# Patient Record
Sex: Male | Born: 1945
Health system: Southern US, Community
[De-identification: ages and names within clinical notes are randomized; demographics above are authoritative.]

## PROBLEM LIST (undated history)

## (undated) DIAGNOSIS — G4733 Obstructive sleep apnea (adult) (pediatric): Secondary | ICD-10-CM

## (undated) DIAGNOSIS — T7840XA Allergy, unspecified, initial encounter: Secondary | ICD-10-CM

## (undated) DIAGNOSIS — M549 Dorsalgia, unspecified: Secondary | ICD-10-CM

## (undated) DIAGNOSIS — Z9989 Dependence on other enabling machines and devices: Secondary | ICD-10-CM

## (undated) DIAGNOSIS — N401 Enlarged prostate with lower urinary tract symptoms: Secondary | ICD-10-CM

## (undated) DIAGNOSIS — G20A1 Parkinson's disease without dyskinesia, without mention of fluctuations: Secondary | ICD-10-CM

## (undated) DIAGNOSIS — K219 Gastro-esophageal reflux disease without esophagitis: Secondary | ICD-10-CM

## (undated) DIAGNOSIS — N138 Other obstructive and reflux uropathy: Secondary | ICD-10-CM

## (undated) DIAGNOSIS — C44301 Unspecified malignant neoplasm of skin of nose: Secondary | ICD-10-CM

## (undated) DIAGNOSIS — I4891 Unspecified atrial fibrillation: Secondary | ICD-10-CM

## (undated) DIAGNOSIS — G20 Parkinson's disease: Secondary | ICD-10-CM

## (undated) DIAGNOSIS — C679 Malignant neoplasm of bladder, unspecified: Secondary | ICD-10-CM

## (undated) HISTORY — DX: Obstructive sleep apnea (adult) (pediatric): G47.33

## (undated) HISTORY — DX: Unspecified malignant neoplasm of skin of nose: C44.301

## (undated) HISTORY — PX: OTHER SURGICAL HISTORY: SHX169

## (undated) HISTORY — PX: COLONOSCOPY: SHX174

## (undated) HISTORY — DX: Parkinson's disease: G20

## (undated) HISTORY — DX: Parkinson's disease without dyskinesia, without mention of fluctuations: G20.A1

## (undated) HISTORY — DX: Allergy, unspecified, initial encounter: T78.40XA

## (undated) HISTORY — DX: Dependence on other enabling machines and devices: Z99.89

## (undated) HISTORY — DX: Unspecified atrial fibrillation: I48.91

## (undated) HISTORY — PX: CYSTOSCOPY WITH URETHRAL DILATATION: SHX5125

## (undated) HISTORY — DX: Dorsalgia, unspecified: M54.9

---

## 1998-06-23 ENCOUNTER — Ambulatory Visit (HOSPITAL_COMMUNITY): Admission: RE | Admit: 1998-06-23 | Discharge: 1998-06-23 | Payer: Self-pay | Admitting: *Deleted

## 1998-07-14 ENCOUNTER — Other Ambulatory Visit: Admission: RE | Admit: 1998-07-14 | Discharge: 1998-07-14 | Payer: Self-pay | Admitting: *Deleted

## 1998-08-02 HISTORY — PX: CERVICAL FUSION: SHX112

## 1998-08-28 ENCOUNTER — Ambulatory Visit (HOSPITAL_COMMUNITY): Admission: RE | Admit: 1998-08-28 | Discharge: 1998-08-28 | Payer: Self-pay | Admitting: Sports Medicine

## 1998-08-28 ENCOUNTER — Encounter: Payer: Self-pay | Admitting: Sports Medicine

## 1998-09-11 ENCOUNTER — Encounter: Payer: Self-pay | Admitting: Sports Medicine

## 1998-09-11 ENCOUNTER — Ambulatory Visit (HOSPITAL_COMMUNITY): Admission: RE | Admit: 1998-09-11 | Discharge: 1998-09-11 | Payer: Self-pay | Admitting: Sports Medicine

## 1998-09-25 ENCOUNTER — Encounter: Payer: Self-pay | Admitting: Sports Medicine

## 1998-09-25 ENCOUNTER — Ambulatory Visit (HOSPITAL_COMMUNITY): Admission: RE | Admit: 1998-09-25 | Discharge: 1998-09-25 | Payer: Self-pay | Admitting: Sports Medicine

## 1999-01-05 ENCOUNTER — Ambulatory Visit (HOSPITAL_COMMUNITY): Admission: RE | Admit: 1999-01-05 | Discharge: 1999-01-05 | Payer: Self-pay | Admitting: *Deleted

## 1999-01-07 ENCOUNTER — Encounter: Payer: Self-pay | Admitting: Neurosurgery

## 1999-01-09 ENCOUNTER — Encounter: Payer: Self-pay | Admitting: Neurosurgery

## 1999-01-09 ENCOUNTER — Inpatient Hospital Stay (HOSPITAL_COMMUNITY): Admission: RE | Admit: 1999-01-09 | Discharge: 1999-01-10 | Payer: Self-pay | Admitting: Neurosurgery

## 2003-08-29 ENCOUNTER — Ambulatory Visit (HOSPITAL_COMMUNITY): Admission: RE | Admit: 2003-08-29 | Discharge: 2003-08-29 | Payer: Self-pay | Admitting: Gastroenterology

## 2010-12-30 ENCOUNTER — Other Ambulatory Visit: Payer: Self-pay | Admitting: Gastroenterology

## 2011-03-31 ENCOUNTER — Other Ambulatory Visit: Payer: Self-pay | Admitting: Urology

## 2011-03-31 ENCOUNTER — Ambulatory Visit (HOSPITAL_BASED_OUTPATIENT_CLINIC_OR_DEPARTMENT_OTHER)
Admission: RE | Admit: 2011-03-31 | Discharge: 2011-03-31 | Disposition: A | Discharge status: Home / Self Care | Payer: BC Managed Care – PPO | Source: Ambulatory Visit | Attending: Urology | Admitting: Urology

## 2011-03-31 DIAGNOSIS — Z01812 Encounter for preprocedural laboratory examination: Secondary | ICD-10-CM | POA: Insufficient documentation

## 2011-03-31 DIAGNOSIS — Z79899 Other long term (current) drug therapy: Secondary | ICD-10-CM | POA: Insufficient documentation

## 2011-03-31 DIAGNOSIS — C679 Malignant neoplasm of bladder, unspecified: Secondary | ICD-10-CM | POA: Insufficient documentation

## 2011-04-06 NOTE — Op Note (Signed)
NAMEMarland Kitchen  JERVON, REAM NO.:  [id number]  MEDICAL RECORD NO.:  [id number]  LOCATION:                                 FACILITY:  PHYSICIAN:  Natalia Leatherwood, MD         DATE OF BIRTH:  DATE OF PROCEDURE:  03/31/2011 DATE OF DISCHARGE:                              OPERATIVE REPORT   SURGEON:  Natalia Leatherwood, MD  ASSISTANT:  Martina Sinner, MD  PREOPERATIVE DIAGNOSIS:  Bladder tumor.  POSTOPERATIVE DIAGNOSIS:  Bladder tumor.  PROCEDURE PERFORMED:  Cystoscopy and transurethral resection of bladder tumor, greater than 2 cm smaller than 5 cm in size.  ESTIMATED BLOOD LOSS:  Minimal.  SPECIMEN:  Bladder tumor sent for permanent pathology.  COMPLICATIONS:  None.  HISTORY OF PRESENT ILLNESS:  This is a 65 year old gentleman, who is a patient of Dr. Sherron Monday, who was found to have a bladder tumor.  This tumor was seen on office cystoscopy and Dr. Sherron Monday scheduled the patient for a transurethral resection of the bladder.  He discussed the risks and benefits of the procedure with him.  Upon evaluation of the tumor while in the operating room, Dr. Sherron Monday did call me in and asked me to help with the resection of the bladder tumor due to its location near the right ureteral orifice and also overlying the right lateral wall of the bladder concerning for obturator reflex.  PROCEDURE:  After informed consent had been obtained, the patient was taken to the operating room where he was placed in the supine position. IV antibiotics were infused and general anesthesia was induced.  He was placed in a dorsal lithotomy position to make sure to pad all pertinent neurovascular pressure points.  His genitals were then prepped and draped in usual sterile fashion and then a 21-French cystoscope was advanced through the urethra and into the bladder.  There was noted that he had a very large obstructing prostate.  The bladder tumor was very extensive ranging from  very close to the right ureteral orifice over the lateral bladder wall on the right side.  There were no other tumors elsewhere in the bladder.  As mentioned at this point, Dr. Sherron Monday did give me a call as he felt this tumor was more extensive than he felt comfortable with resecting.  I did come in and we placed a blunt obturator for the Gyrus resectoscope through the urethra and the bladder.  Next, using continuous flow and resecting in normal saline, we performed a transurethral resection of the bladder tumor.  We were constantly aware of the location of the right ureteral orifice.  There was a small obturator reflex, but there was no injury while this occurred.  The tumor was resected in its entirety, making sure to dissect down into muscular tissue as well.  All of the bladder chips were removed from the bladder and sent for permanent pathology. Hemostasis was maintained by coagulating the edges of the tumor, also coagulating very close to the right ureteral orifice, but not circumferentially as there was concerning tissue close to this area. After this was done, a 20-French Coude catheter was placed as the patient had  a large obstructing prostate.  10 cc of sterile water replaced into this.  Patient was placed back in supine position and anesthesia was reversed and he was taken to the PACU in a stable condition.  Patient will return to my clinic on Tuesday to have the catheter removed.          ______________________________ Natalia Leatherwood, MD     DW/MEDQ  D:  03/31/2011  T:  04/01/2011  Job:  [id number]  Electronically Signed by Natalia Leatherwood MD on 04/06/2011 07:30:25 PM

## 2011-05-04 ENCOUNTER — Other Ambulatory Visit: Payer: Self-pay | Admitting: Urology

## 2011-05-04 ENCOUNTER — Ambulatory Visit (HOSPITAL_BASED_OUTPATIENT_CLINIC_OR_DEPARTMENT_OTHER)
Admission: RE | Admit: 2011-05-04 | Discharge: 2011-05-04 | Disposition: A | Discharge status: Home / Self Care | Payer: BC Managed Care – PPO | Source: Ambulatory Visit | Attending: Urology | Admitting: Urology

## 2011-05-04 DIAGNOSIS — N35919 Unspecified urethral stricture, male, unspecified site: Secondary | ICD-10-CM | POA: Insufficient documentation

## 2011-05-04 DIAGNOSIS — C679 Malignant neoplasm of bladder, unspecified: Secondary | ICD-10-CM | POA: Insufficient documentation

## 2011-05-04 DIAGNOSIS — Z01812 Encounter for preprocedural laboratory examination: Secondary | ICD-10-CM | POA: Insufficient documentation

## 2011-05-04 LAB — POCT HEMOGLOBIN-HEMACUE: Hemoglobin: 14.2 g/dL (ref 13.0–17.0)

## 2011-05-07 NOTE — Op Note (Signed)
Ryan Woodward, Ryan Woodward NO.:  [id number]  MEDICAL RECORD NO.:  [id number]  LOCATION:                               FACILITY:  [facility]  PHYSICIAN:  Natalia Leatherwood, MD    DATE OF BIRTH:  February 12, 1946  DATE OF PROCEDURE:  05/04/2011 DATE OF DISCHARGE:                              OPERATIVE REPORT   PREOPERATIVE DIAGNOSIS:  Bladder cancer.  POSTOPERATIVE DIAGNOSES:  Bladder cancer and meatal stenosis.  PROCEDURE:  Transurethral resection of the bladder tumor base, urethral dilation, bladder biopsy, cystourethroscopy, catheter placement.  ASSISTANTS:  None.  COMPLICATIONS:  None.  ANESTHESIA:  General.  SPECIMENS: 1. Chips from bladder tumor base were sent as one specimen. 2. Right lateral bladder wall biopsy. 3. Left lateral bladder wall biopsy.  ESTIMATED BLOOD LOSS:  Minimal.  COMPLICATIONS:  None.  FINDINGS:  Previously resected bladder tumor site.  HISTORY OF PRESENT ILLNESS:  This is a 65 year old gentleman who was found to have a bladder tumor who underwent transurethral resection of the bladder tumor.  This came back as high-grade urothelial carcinoma. We had a discussion about further management.  I had recommended re- resection to ensure as accurate staging as possible.  The patient agreed and presented today for that procedure.  PROCEDURE IN DETAIL:  After informed consent was obtained, the patient was taken to the operating room where he was placed in supine position.  IV antibiotics were infused and general esthesia was induced.  His genitals were prepped and draped in the usual sterile fashion.  A time-out was performed in which the correct surgical site, patient, and procedure were identified and agreed upon.  After this, attempt to place a 22- French rigid cystoscope was unable to be performed because of meatal stenosis.  Sissy Hoff dilators were then used to dilate the patient to 27- [place].  After this was done, the scope passed with  ease.  It was noted there was some irritation at the urethral meatus.  The cystoscope was then passed into the bladder.  The bladder was evaluated at 30-degree and 70-degree scope.  There were no other suspicious areas noted in the bladder.  This was then removed and the blunt obturator to the gyrus was then placed and then the gyrus resectoscope was placed and the base of the bladder tumor was resected.  We made sure to identify both ureteral orifices to avoid resection over these.  After this was done, the base was cauterized for hemostasis and then all bladder chips were removed and sent to Pathology as one specimen.  Next, cold cup biopsy forceps were used to take a biopsy of the right lateral bladder wall and the left lateral bladder wall.  Each of these were sent separately.  These biopsy spots were then fulgurated to ensure hemostasis.  After this was done, all bladder chips were removed, and therefore, the cystoscope was removed and then a 24-French coude tip Hematuria three-way catheter with 30-degree balloon was placed into the bladder and inflated with 30 cc of sterile water.  Bacitracin ointment was placed around the patient's urethral meatus.  This completed the procedure.  The patient was placed back in supine  position.  Anesthesia was reversed and he was taken to the PACU in stable condition.  He will follow up with me in 1 week for catheter removal.          ______________________________ Natalia Leatherwood, MD     DW/MEDQ  D:  05/04/2011  T:  05/05/2011  Job:  [id number]  Electronically Signed by Natalia Leatherwood MD on 05/07/2011 08:48:29 PM

## 2011-06-15 ENCOUNTER — Encounter (HOSPITAL_BASED_OUTPATIENT_CLINIC_OR_DEPARTMENT_OTHER): Payer: Self-pay | Admitting: *Deleted

## 2011-06-15 NOTE — Progress Notes (Signed)
NPO after MN. Pt to arrive at 1015. Needs hg. EKG 03-31-11 in [facility]. Will do flonase nasal spray and take doxazosin am of surg. W/ sip of water. Reviewed [facility] guidelines, will bring meds.

## 2011-06-16 ENCOUNTER — Ambulatory Visit (HOSPITAL_BASED_OUTPATIENT_CLINIC_OR_DEPARTMENT_OTHER)
Admission: RE | Admit: 2011-06-16 | Discharge: 2011-06-17 | Disposition: A | Discharge status: Home / Self Care | Payer: BC Managed Care – PPO | Source: Ambulatory Visit | Attending: Urology | Admitting: Urology

## 2011-06-16 ENCOUNTER — Encounter (HOSPITAL_BASED_OUTPATIENT_CLINIC_OR_DEPARTMENT_OTHER)
Admission: RE | Disposition: A | Discharge status: Home / Self Care | Payer: Self-pay | Source: Ambulatory Visit | Attending: Urology

## 2011-06-16 ENCOUNTER — Encounter (HOSPITAL_BASED_OUTPATIENT_CLINIC_OR_DEPARTMENT_OTHER): Payer: Self-pay | Admitting: Anesthesiology

## 2011-06-16 ENCOUNTER — Other Ambulatory Visit: Payer: Self-pay | Admitting: Urology

## 2011-06-16 ENCOUNTER — Ambulatory Visit (HOSPITAL_BASED_OUTPATIENT_CLINIC_OR_DEPARTMENT_OTHER): Admit: 2011-06-16 | Payer: Self-pay | Admitting: Specialist

## 2011-06-16 ENCOUNTER — Encounter (HOSPITAL_BASED_OUTPATIENT_CLINIC_OR_DEPARTMENT_OTHER): Payer: Self-pay

## 2011-06-16 ENCOUNTER — Ambulatory Visit (HOSPITAL_BASED_OUTPATIENT_CLINIC_OR_DEPARTMENT_OTHER): Payer: BC Managed Care – PPO | Admitting: Anesthesiology

## 2011-06-16 DIAGNOSIS — Z79899 Other long term (current) drug therapy: Secondary | ICD-10-CM | POA: Insufficient documentation

## 2011-06-16 DIAGNOSIS — N35919 Unspecified urethral stricture, male, unspecified site: Secondary | ICD-10-CM | POA: Insufficient documentation

## 2011-06-16 DIAGNOSIS — N401 Enlarged prostate with lower urinary tract symptoms: Secondary | ICD-10-CM | POA: Insufficient documentation

## 2011-06-16 DIAGNOSIS — N320 Bladder-neck obstruction: Secondary | ICD-10-CM | POA: Insufficient documentation

## 2011-06-16 DIAGNOSIS — N400 Enlarged prostate without lower urinary tract symptoms: Secondary | ICD-10-CM

## 2011-06-16 DIAGNOSIS — N138 Other obstructive and reflux uropathy: Secondary | ICD-10-CM | POA: Insufficient documentation

## 2011-06-16 DIAGNOSIS — C679 Malignant neoplasm of bladder, unspecified: Secondary | ICD-10-CM | POA: Insufficient documentation

## 2011-06-16 HISTORY — DX: Malignant neoplasm of bladder, unspecified: C67.9

## 2011-06-16 HISTORY — PX: CYSTOSCOPY: SHX5120

## 2011-06-16 HISTORY — DX: Other obstructive and reflux uropathy: N40.1

## 2011-06-16 HISTORY — PX: TRANSURETHRAL RESECTION OF PROSTATE: SHX73

## 2011-06-16 HISTORY — DX: Other obstructive and reflux uropathy: N13.8

## 2011-06-16 LAB — POCT I-STAT 4, (NA,K, GLUC, HGB,HCT)
Glucose, Bld: 107 mg/dL — ABNORMAL HIGH (ref 70–99)
HCT: 41 % (ref 39.0–52.0)
Potassium: 4.2 mEq/L (ref 3.5–5.1)
Sodium: 143 mEq/L (ref 135–145)

## 2011-06-16 LAB — BASIC METABOLIC PANEL
CO2: 27 mEq/L (ref 19–32)
Chloride: 105 mEq/L (ref 96–112)
Creatinine, Ser: 0.84 mg/dL (ref 0.50–1.35)
Sodium: 141 mEq/L (ref 135–145)

## 2011-06-16 SURGERY — CYSTOSCOPY
Anesthesia: Choice

## 2011-06-16 SURGERY — Surgical Case
Anesthesia: *Unknown

## 2011-06-16 SURGERY — CYSTOSCOPY
Anesthesia: General

## 2011-06-16 MED ORDER — FINASTERIDE 5 MG PO TABS: 5.0000 mg | ORAL_TABLET | Freq: Every day | ORAL | Status: DC

## 2011-06-16 MED ORDER — FENTANYL CITRATE 0.05 MG/ML IJ SOLN
25.0000 ug | INTRAMUSCULAR | Status: DC | PRN
  Administered 2011-06-16 (×2): 25 ug via INTRAVENOUS

## 2011-06-16 MED ORDER — CEFAZOLIN SODIUM 1-5 GM-% IV SOLN
1.0000 g | Freq: Three times a day (TID) | INTRAVENOUS | Status: DC
  Administered 2011-06-17: 1 g via INTRAVENOUS

## 2011-06-16 MED ORDER — LIDOCAINE HCL (CARDIAC) 20 MG/ML IV SOLN
INTRAVENOUS | Status: DC | PRN
  Administered 2011-06-16: 100 mg via INTRAVENOUS

## 2011-06-16 MED ORDER — FLUTICASONE PROPIONATE 50 MCG/ACT NA SUSP: 2.0000 | NASAL | Status: DC

## 2011-06-16 MED ORDER — BELLADONNA ALKALOIDS-OPIUM 16.2-60 MG RE SUPP
RECTAL | Status: DC | PRN
  Administered 2011-06-16: 1 via RECTAL

## 2011-06-16 MED ORDER — PROPOFOL 10 MG/ML IV EMUL
INTRAVENOUS | Status: DC | PRN
  Administered 2011-06-16: 300 mg via INTRAVENOUS

## 2011-06-16 MED ORDER — BUPIVACAINE HCL (PF) 0.5 % IJ SOLN
INTRAMUSCULAR | Status: DC | PRN
  Administered 2011-06-16: 10 mL

## 2011-06-16 MED ORDER — MIDAZOLAM HCL 5 MG/5ML IJ SOLN
INTRAMUSCULAR | Status: DC | PRN
  Administered 2011-06-16 (×2): 2 mg via INTRAVENOUS

## 2011-06-16 MED ORDER — SODIUM CHLORIDE 0.9 % IR SOLN
Status: DC | PRN
  Administered 2011-06-16: 3000 mL

## 2011-06-16 MED ORDER — ACETAMINOPHEN 10 MG/ML IV SOLN
1000.0000 mg | Freq: Four times a day (QID) | INTRAVENOUS | Status: DC
  Administered 2011-06-16 – 2011-06-17 (×2): 1000 mg via INTRAVENOUS

## 2011-06-16 MED ORDER — POLYETHYLENE GLYCOL 3350 17 G PO PACK
17.0000 g | PACK | Freq: Every day | ORAL | Status: DC
  Administered 2011-06-16: 17 g via ORAL

## 2011-06-16 MED ORDER — ONDANSETRON HCL 4 MG/2ML IJ SOLN
INTRAMUSCULAR | Status: DC | PRN
  Administered 2011-06-16: 4 mg via INTRAVENOUS

## 2011-06-16 MED ORDER — MORPHINE SULFATE 2 MG/ML IJ SOLN
2.0000 mg | INTRAMUSCULAR | Status: DC | PRN
  Administered 2011-06-16: 2 mg via INTRAVENOUS

## 2011-06-16 MED ORDER — ACETAMINOPHEN 10 MG/ML IV SOLN
1000.0000 mg | Freq: Four times a day (QID) | INTRAVENOUS | Status: DC
  Administered 2011-06-16: 1000 mg via INTRAVENOUS

## 2011-06-16 MED ORDER — SODIUM CHLORIDE 0.9 % IR SOLN
Status: DC | PRN
  Administered 2011-06-16: 1

## 2011-06-16 MED ORDER — CEFAZOLIN SODIUM 1-5 GM-% IV SOLN
1.0000 g | Freq: Three times a day (TID) | INTRAVENOUS | Status: DC
  Administered 2011-06-16: 1 g via INTRAVENOUS

## 2011-06-16 MED ORDER — SENNOSIDES-DOCUSATE SODIUM 8.6-50 MG PO TABS
1.0000 | ORAL_TABLET | Freq: Two times a day (BID) | ORAL | Status: DC
  Administered 2011-06-16: 1 via ORAL

## 2011-06-16 MED ORDER — ACETAMINOPHEN 325 MG PO TABS: 650.0000 mg | ORAL_TABLET | ORAL | Status: DC | PRN

## 2011-06-16 MED ORDER — ATROPINE SULFATE 0.4 MG/ML IJ SOLN
INTRAMUSCULAR | Status: DC | PRN
  Administered 2011-06-16: 0.4 mg via INTRAVENOUS

## 2011-06-16 MED ORDER — BELLADONNA ALKALOIDS-OPIUM 16.2-60 MG RE SUPP: 1.0000 | Freq: Four times a day (QID) | RECTAL | Status: DC | PRN

## 2011-06-16 MED ORDER — OXYCODONE HCL 5 MG PO TABS: 5.0000 mg | ORAL_TABLET | ORAL | Status: DC | PRN

## 2011-06-16 MED ORDER — CEFAZOLIN SODIUM 1-5 GM-% IV SOLN: 1.0000 g | INTRAVENOUS | Status: DC

## 2011-06-16 MED ORDER — PHENAZOPYRIDINE HCL 100 MG PO TABS
100.0000 mg | ORAL_TABLET | Freq: Three times a day (TID) | ORAL | Status: DC | PRN
  Administered 2011-06-16: 100 mg via ORAL

## 2011-06-16 MED ORDER — DOXAZOSIN MESYLATE 8 MG PO TABS: 8.0000 mg | ORAL_TABLET | ORAL | Status: DC

## 2011-06-16 MED ORDER — SODIUM CHLORIDE 0.9 % IV SOLN: INTRAVENOUS | Status: DC

## 2011-06-16 MED ORDER — LACTATED RINGERS IV SOLN
INTRAVENOUS | Status: DC | PRN
  Administered 2011-06-16 (×2): via INTRAVENOUS

## 2011-06-16 MED ORDER — AZELASTINE HCL 0.1 % NA SOLN: 1.0000 | NASAL | Status: DC | PRN

## 2011-06-16 MED ORDER — GLYCOPYRROLATE 0.2 MG/ML IJ SOLN
INTRAMUSCULAR | Status: DC | PRN
  Administered 2011-06-16: 0.2 mg via INTRAVENOUS

## 2011-06-16 MED ORDER — SODIUM CHLORIDE 0.9 % IR SOLN: 3000.0000 mL | Status: DC

## 2011-06-16 MED ORDER — BACITRACIN-NEOMYCIN-POLYMYXIN 400-5-5000 EX OINT: 1.0000 "application " | TOPICAL_OINTMENT | Freq: Three times a day (TID) | CUTANEOUS | Status: DC | PRN

## 2011-06-16 MED ORDER — DEXAMETHASONE SODIUM PHOSPHATE 4 MG/ML IJ SOLN
INTRAMUSCULAR | Status: DC | PRN
  Administered 2011-06-16: 4 mg via INTRAVENOUS

## 2011-06-16 MED ORDER — FENTANYL CITRATE 0.05 MG/ML IJ SOLN
INTRAMUSCULAR | Status: DC | PRN
  Administered 2011-06-16: 25 ug via INTRAVENOUS
  Administered 2011-06-16: 50 ug via INTRAVENOUS
  Administered 2011-06-16 (×3): 25 ug via INTRAVENOUS
  Administered 2011-06-16: 50 ug via INTRAVENOUS
  Administered 2011-06-16: 100 ug via INTRAVENOUS

## 2011-06-16 MED ORDER — DOXAZOSIN MESYLATE 4 MG PO TABS: 4.0000 mg | ORAL_TABLET | Freq: Every day | ORAL | Status: DC

## 2011-06-16 MED ORDER — LACTATED RINGERS IV SOLN
INTRAVENOUS | Status: DC
  Administered 2011-06-16: 11:00:00 via INTRAVENOUS

## 2011-06-16 MED ORDER — ONDANSETRON HCL 4 MG/2ML IJ SOLN: 4.0000 mg | INTRAMUSCULAR | Status: DC | PRN

## 2011-06-16 MED ORDER — KETOROLAC TROMETHAMINE 30 MG/ML IJ SOLN: 15.0000 mg | Freq: Once | INTRAMUSCULAR | Status: AC | PRN

## 2011-06-16 SURGICAL SUPPLY — 36 items
BAG DRAIN URO-CYSTO SKYTR STRL (DRAIN) ×2 IMPLANT
BAG DRN ANRFLXCHMBR STRAP LEK (BAG)
BAG DRN UROCATH (DRAIN) ×1
BAG URINE DRAINAGE (UROLOGICAL SUPPLIES) IMPLANT
BAG URINE LEG 19OZ MD ST LTX (BAG) IMPLANT
CANISTER SUCT LVC 12 LTR MEDI- (MISCELLANEOUS) ×4 IMPLANT
CATH FOLEY 2WAY SLVR 30CC 22FR (CATHETERS) ×1 IMPLANT
CATH HEMA 3WAY 30CC 24FR COUDE (CATHETERS) IMPLANT
CATH HEMA 3WAY 30CC 24FR RND (CATHETERS) ×1 IMPLANT
CLOTH BEACON ORANGE TIMEOUT ST (SAFETY) ×2 IMPLANT
DRAPE CAMERA CLOSED 9X96 (DRAPES) ×2 IMPLANT
ELECT BUTTON HF 24-28F 2 30DE (ELECTRODE) IMPLANT
ELECT LOOP MED HF 24F 12D (CUTTING LOOP) ×1 IMPLANT
ELECT LOOP MED HF 24F 12D CBL (CLIP) ×1 IMPLANT
ELECT REM PT RETURN 9FT ADLT (ELECTROSURGICAL) ×2
ELECT RESECT VAPORIZE 12D CBL (ELECTRODE) ×1 IMPLANT
ELECTRODE REM PT RTRN 9FT ADLT (ELECTROSURGICAL) ×1 IMPLANT
EVACUATOR MICROVAS BLADDER (UROLOGICAL SUPPLIES) IMPLANT
GLOVE ECLIPSE 6.0 STRL STRAW (GLOVE) ×1 IMPLANT
GLOVE ECLIPSE 6.5 STRL STRAW (GLOVE) ×2 IMPLANT
GLOVE ECLIPSE 7.0 STRL STRAW (GLOVE) ×2 IMPLANT
GLOVE INDICATOR 6.5 STRL GRN (GLOVE) ×2 IMPLANT
GLOVE INDICATOR 7.5 STRL GRN (GLOVE) ×2 IMPLANT
GOWN STRL NON-REIN LRG LVL3 (GOWN DISPOSABLE) ×1 IMPLANT
GOWN STRL REIN XL XLG (GOWN DISPOSABLE) ×2 IMPLANT
HOLDER FOLEY CATH W/STRAP (MISCELLANEOUS) IMPLANT
IV NS IRRIG 3000ML ARTHROMATIC (IV SOLUTION) ×9 IMPLANT
KIT ASPIRATION TUBING (SET/KITS/TRAYS/PACK) ×1 IMPLANT
NDL HYPO 25X1 1.5 SAFETY (NEEDLE) IMPLANT
NEEDLE HYPO 22GX1.5 SAFETY (NEEDLE) IMPLANT
NEEDLE HYPO 25X1 1.5 SAFETY (NEEDLE) ×2 IMPLANT
NS IRRIG 500ML POUR BTL (IV SOLUTION) IMPLANT
PACK CYSTOSCOPY (CUSTOM PROCEDURE TRAY) ×2 IMPLANT
PLUG CATH AND CAP STER (CATHETERS) IMPLANT
SYR 30ML LL (SYRINGE) ×1 IMPLANT
SYRINGE IRR TOOMEY STRL 70CC (SYRINGE) ×2 IMPLANT

## 2011-06-16 NOTE — Anesthesia Postprocedure Evaluation (Signed)
  Immediate Anesthesia Transfer of Care Note  Patient: Ryan Woodward  Procedure(s) Performed: * No surgery found *  Patient Location: PACU  Anesthesia Type: General  Level of Consciousness: awake, sedated, patient cooperative and responds to stimulation  Airway & Oxygen Therapy: Patient Spontanous Breathing and Patient connected to face mask oxygen  Post-op Assessment: Report given to PACU RN, Post -op Vital signs reviewed and stable and Patient moving all extremities  Post vital signs: Reviewed and stable  Complications: No apparent anesthesia complications

## 2011-06-16 NOTE — Anesthesia Preprocedure Evaluation (Signed)
Anesthesia Evaluation  Patient identified by MRN, date of birth, ID band Patient awake    Reviewed: Allergy & Precautions, H&P , NPO status , Patient's Chart, lab work & pertinent test results  Airway Mallampati: II TM Distance: >3 FB Neck ROM: Full    Dental No notable dental hx.    Pulmonary neg pulmonary ROS,  clear to auscultation  Pulmonary exam normal       Cardiovascular neg cardio ROS Regular Normal    Neuro/Psych Negative Neurological ROS  Negative Psych ROS   GI/Hepatic negative GI ROS, Neg liver ROS,   Endo/Other  Negative Endocrine ROS  Renal/GU negative Renal ROS  Genitourinary negative   Musculoskeletal negative musculoskeletal ROS (+)   Abdominal   Peds negative pediatric ROS (+)  Hematology negative hematology ROS (+)   Anesthesia Other Findings   Reproductive/Obstetrics negative OB ROS                           Anesthesia Physical Anesthesia Plan  ASA: I  Anesthesia Plan: General   Post-op Pain Management:    Induction: Intravenous  Airway Management Planned: LMA  Additional Equipment:   Intra-op Plan:   Post-operative Plan:   Informed Consent: I have reviewed the patients History and Physical, chart, labs and discussed the procedure including the risks, benefits and alternatives for the proposed anesthesia with the patient or authorized representative who has indicated his/her understanding and acceptance.   Dental advisory given  Plan Discussed with: CRNA  Anesthesia Plan Comments:         Anesthesia Quick Evaluation  

## 2011-06-16 NOTE — Progress Notes (Signed)
GU  Patient doing well post-op.  Pain controlled.    Filed Vitals:   06/16/11 1800  BP: 110/71  Pulse: 57  Temp: 97 F (36.1 C)  Resp: 18    PE: Gen: NAD Abdomen: soft, NTTP GU:  Foley draining clear yellow urine.  A/P:  TURP today -Continue CBI -Likely discharge home tomorrow.  Natalia Leatherwood, MD [phone]

## 2011-06-16 NOTE — Anesthesia Procedure Notes (Addendum)
Procedure Name: LMA Insertion Performed by: Iline Oven Pre-anesthesia Checklist: Patient identified, Emergency Drugs available, Suction available and Patient being monitored Patient Re-evaluated:Patient Re-evaluated prior to inductionOxygen Delivery Method: Circle System Utilized Preoxygenation: Pre-oxygenation with 100% oxygen Intubation Type: IV induction Ventilation: Mask ventilation without difficulty LMA: LMA inserted LMA Size: 5.0 Number of attempts: 1 Airway Equipment and Method: bite block Placement Confirmation: positive ETCO2 Tube secured with: Tape Dental Injury: Teeth and Oropharynx as per pre-operative assessment

## 2011-06-16 NOTE — Progress Notes (Signed)
Traction removed from foley by MD AT 1440

## 2011-06-16 NOTE — Transfer of Care (Signed)
Immediate Anesthesia Transfer of Care Note  Patient: Ryan Woodward  Procedure(s) Performed:  CYSTOSCOPY; TRANSURETHRAL RESECTION OF THE PROSTATE (TURP) - URETHRAL DILATATION, MEATOTOMY, PENIL BLOCK  Patient Location: PACU  Anesthesia Type: General  Level of Consciousness: awake, sedated, patient cooperative and responds to stimulation  Airway & Oxygen Therapy: Patient Spontanous Breathing and Patient connected to face mask oxygen  Post-op Assessment: Report given to PACU RN, Post -op Vital signs reviewed and stable and Patient moving all extremities  Post vital signs: Reviewed and stable  Complications: No apparent anesthesia complications

## 2011-06-16 NOTE — Brief Op Note (Signed)
06/16/2011  1:38 PM  PATIENT:  Ryan Woodward  65 y.o. male  PRE-OPERATIVE DIAGNOSIS:  URINARY OBSTRUCTION PROSTATE ENLARGEMENT   POST-OPERATIVE DIAGNOSIS:  URINARY OBSTRUCTION PROSTATE ENLARGEMENT  AND MEATAL STENOSIS  PROCEDURE:  Procedure(s): CYSTOSCOPY TRANSURETHRAL RESECTION OF THE PROSTATE (TURP) Urethral dilation. Meatotomy Penile block.  SURGEON:  Surgeon(s): Milford Cage, MD  PHYSICIAN ASSISTANT:   ASSISTANTS: none   ANESTHESIA:   local and general  EBL:  100 mL  BLOOD ADMINISTERED:none  DRAINS: Urinary Catheter (Foley)   LOCAL MEDICATIONS USED:  MARCAINE 10CC  SPECIMEN:  Source of Specimen:  TURP chips  DISPOSITION OF SPECIMEN:  PATHOLOGY  COUNTS:  YES  TOURNIQUET:  * No tourniquets in log *  DICTATION: .Other Dictation: Dictation Number [id number]  PLAN OF CARE: Admit for overnight observation  PATIENT DISPOSITION:  PACU - hemodynamically stable.   Delay start of Pharmacological VTE agent (>24hrs) due to surgical blood loss or risk of bleeding:  {YES/NO/NOT APPLICABLE:20182

## 2011-06-16 NOTE — H&P (Signed)
Chief Complaint  BPH   History of Present Illness  Patient has a history of high-grade, non-muscle invasive urothelial carcinoma of the bladder.  He has elected BCG treatment for this disease.  Due to urinary obstruction by his prostate seen on urodynamics, I have recommended a TURP prior to intravesical BCG.  today we discussed the risks, benefits, alternatives, and likelihood of achieving these goals. He understands that other options include minimally invasive procedures with catheters, such as cooled thermotherapy, all the way up to an open prostatectomy. Today I explained the risks of the procedure which include, but are not limited to, heart attack, stroke, pulmonary embolus, pneumonia, death, positioning injury, permanent or temporary neuropathy, bleeding, infection, need for further procedure, damage to contiguous structures, idiopathic irritative voiding symptoms, urinary incontinence, continued urinary obstruction, dry ejaculation, need for prolonged catheterization, and TUR syndrome. Patient voiced understanding and wishes to proceed.   The patient is also noted to have meatal stenosis.  This has caused a large amount of discomfort for the patient with his multiple transurethral surgeries. Today I recommended that we perform a meatotomy at the time of this procedure. We discussed the risks, benefits, alternatives, and likelihood of achieving his goals. He wishes to proceed with this.    Surgical History Problems  1. History of  Cystoscopy For Urethral Stricture 2. History of  Cystoscopy With Fulguration Medium Lesion (2-5cm) 3. History of  Cystoscopy With Fulguration Small Lesion (5-90mm) 4. History of  Neck Surgery 5. History of  Sinus Surgery  Current Meds 1. Astepro SOLN; Therapy: (Recorded:02Jul2012) to 2. Cortisporin 1 % External Ointment; APPLY SPARINGLY TO AFFECTED AREA(S) 3 TIMES A DAY;  Therapy: 08Oct2012 to (Evaluate:17Nov2012)  Requested for: 08Oct2012; Last  Rx:08Oct2012 3. Doxazosin Mesylate 4 MG Oral Tablet; Therapy: 01Jun2012 to 4. Doxycycline Hyclate 100 MG Oral Capsule; TAKE 1 CAPSULE DAILY UNTIL FINISHED; Therapy:  24Sep2012 to (Evaluate:22Oct2012)  Requested for: 24Sep2012; Last Rx:24Sep2012 5. Finasteride 5 MG Oral Tablet; TAKE 1 TABLET BY MOUTH   EVERY DAY; Therapy: 08Oct2012 to  (Evaluate:08Oct2013)  Requested for: 08Oct2012; Last Rx:08Oct2012 6. Flonase 50 MCG/ACT Nasal Suspension; Therapy: (Recorded:02Jul2012) to 7. Fluconazole 100 MG Oral Tablet; TAKE 1 TABLET DAILY; Therapy: 15Oct2012 to  (Evaluate:20Oct2012)  Requested for: 15Oct2012; Last Rx:15Oct2012 8. Hyoscyamine Sulfate 0.125 MG Sublingual Tablet Sublingual; Therapy: 29Aug2012 to 9. Meloxicam 15 MG Oral Tablet; TAKE 1 TABLET DAILY AS NEEDED; Therapy: 15Oct2012 to  (Evaluate:14Nov2012)  Requested for: 15Oct2012; Last Rx:15Oct2012 10. Oxybutynin Chloride 5 MG Oral Tablet; Therapy: 29Aug2012 to 11. Oxycodone-Acetaminophen 5-325 MG Oral Tablet; Therapy: 02Oct2012 to 12. PEG 3350-KCl-Na Bicarb-NaCl 420 GM Oral Solution Reconstituted; Therapy: 28Mar2012 to 13. Uribel 118 MG Oral Capsule; TAKE 1 CAPSULE 4 times daily PRN; Therapy: 14Sep2012 to (Last   Rx:14Sep2012)  Requested for: 14Sep2012  Allergies Medication  1. No Known Drug Allergies  Family History Problems  1. Maternal history of  Alzheimer's Disease 2. Paternal history of  Death In The Family Father father passed @ age 72unknown 3. Maternal history of  Death In The Family Mother mother passed @ age 82stroke/alzheimers 4. Maternal history of  Hypertension V17.49 5. Maternal history of  Stroke Syndrome V17.1  Social History Problems  1. Alcohol Use 3 drinks per week 2. Caffeine Use 3 drinks daily 3. Marital History - Single 4. Occupation: Retired 5. History of  Tobacco Use 305.1 smoked 1 pack dailysmoked for 22 yearsquit smoking 24 years ago  Review of Systems Constitutional, skin, eye, otolaryngeal,  hematologic/lymphatic, cardiovascular, pulmonary, endocrine, neurological and  psychiatric system(s) were reviewed and pertinent findings if present are noted.  Gastrointestinal: no nausea, no vomiting and no constipation.  Musculoskeletal: back pain, but no joint pain.     Physical Exam Constitutional: Well nourished and well developed.  ENT:. The ears and nose are normal in appearance. The oropharynx is normal.  Neck: The appearance of the neck is normal and no neck mass is present.  Pulmonary: No respiratory distress and normal respiratory rhythm and effort.  Cardiovascular: Heart rate and rhythm are normal . No peripheral edema.  Abdomen: The abdomen is soft and nontender. No masses are palpated. The abdomen is no rebound. No CVA tenderness.  Genitourinary: Examination of the penis demonstrates meatal stenosis.  Lymphatics: The posterior cervical and supraclavicular nodes are not enlarged or tender.  Skin: Normal skin turgor and no visible rash.  Neuro/Psych:. Mood and affect are appropriate. No focal sensory deficits.     Assessment Assessed  Benign Prostatic Hypertrophy  Meatal Stenosis  Plan Proceed to operating room for cystoscopy, meatotomy, and TURP.

## 2011-06-17 MED ORDER — PHENAZOPYRIDINE HCL 100 MG PO TABS: 100.0000 mg | ORAL_TABLET | Freq: Three times a day (TID) | ORAL | Status: DC | PRN

## 2011-06-17 MED ORDER — POLYETHYLENE GLYCOL 3350 17 G PO PACK: 17.0000 g | PACK | Freq: Every day | ORAL | Status: AC

## 2011-06-17 MED ORDER — BELLADONNA ALKALOIDS-OPIUM 16.2-60 MG RE SUPP: 1.0000 | Freq: Four times a day (QID) | RECTAL | Status: AC | PRN

## 2011-06-17 MED ORDER — SENNOSIDES-DOCUSATE SODIUM 8.6-50 MG PO TABS: 1.0000 | ORAL_TABLET | Freq: Two times a day (BID) | ORAL | Status: AC

## 2011-06-17 MED ORDER — OXYCODONE HCL 5 MG PO TABS: 5.0000 mg | ORAL_TABLET | ORAL | Status: AC | PRN

## 2011-06-17 MED ORDER — BACITRACIN-NEOMYCIN-POLYMYXIN 400-5-5000 EX OINT: 1.0000 "application " | TOPICAL_OINTMENT | Freq: Three times a day (TID) | CUTANEOUS | Status: AC | PRN

## 2011-06-17 NOTE — Progress Notes (Signed)
Urology Progress Note  Subjective:     No acute urologic events overnight. Negative for nausea or emesis.  CBI has been off for 30 minutes; urine is orange colored from pyridium.  Pain: [ x ] Well controlled.  [  ] Needs adjustment.  Objective:  Patient Vitals for the past 24 hrs:  BP Temp Temp src Pulse Resp SpO2  06/16/11 2330 138/79 mmHg 99 F (37.2 C) Oral 54  18  95 %  06/16/11 2015 136/77 mmHg 96.7 F (35.9 C) - 56  18  96 %  06/16/11 1800 110/71 mmHg 97 F (36.1 C) - 57  18  97 %  06/16/11 1549 148/83 mmHg 96.6 F (35.9 C) Oral 63  18  96 %  06/16/11 1530 125/65 mmHg - - 49  11  96 %  06/16/11 1515 121/58 mmHg - - 77  14  97 %  06/16/11 1500 132/62 mmHg - - 42  14  96 %  06/16/11 1445 137/63 mmHg - - 33  13  96 %  06/16/11 1430 123/75 mmHg - - 31  14  97 %  06/16/11 1415 129/66 mmHg - - 31  19  98 %  06/16/11 1400 141/61 mmHg - - 34  17  98 %  06/16/11 1357 - - - 33  25  97 %  06/16/11 1356 129/75 mmHg - - 32  6  98 %  06/16/11 1355 - - - 70  - 97 %  06/16/11 1345 - 97.1 F (36.2 C) - - - -  06/16/11 1029 89/61 mmHg 97.1 F (36.2 C) Oral 67  20  94 %    Physical Exam: General:  No acute distress, awake Cardiovascular:    [x]   S1/S2 present, RRR  []   Irregularly irregular Chest:  CTA-B Abdomen:               []  Soft, appropriately TTP  [x]  Soft, NTTP  []  Soft, appropriately TTP, incision(s) clean/dry/intact  Genitourinary: Urine draining clear orange colored urine.  No clots.  CBI off.     I/O last 3 completed shifts: In: 2940 [P.O.:1440; I.V.:1500] Out: -900   Recent Labs  Basename 06/16/11 1136   HGB 13.9   WBC --   PLT --    Recent Labs  Basename 06/16/11 1430 06/16/11 1136   NA 141 143   K 4.0 4.2   CL 105 --   CO2 27 --   BUN 13 --   CREATININE 0.84 --   CALCIUM 9.2 --   GFRNONAA >90 --   GFRAA >90 --     No results found for this basename: PT:2,INR:2,APTT:2 in the last 72 hours   No components found with this basename:  ABG:2     Assessment: POD#1 TURP   Plan: -Discontinue CBI. -Discharge home.    Natalia Leatherwood, MD [phone]

## 2011-06-17 NOTE — Op Note (Signed)
NAME:  Ryan Woodward, Ryan Woodward                      ACCOUNT NO.:  MEDICAL RECORD NO.:  [id number]  LOCATION:                                 FACILITY:  PHYSICIAN:  Natalia Leatherwood, MD         DATE OF BIRTH:  DATE OF PROCEDURE:  06/16/2011 DATE OF DISCHARGE:                              OPERATIVE REPORT   SURGEON:  Natalia Leatherwood, MD  ASSISTANT:  None.  PREOPERATIVE DIAGNOSIS:  Benign prostatic hypertrophy with urinary obstruction.  POSTOPERATIVE DIAGNOSIS:  Benign prostatic hypertrophy with urinary obstruction.  PROCEDURES PERFORMED: 1. Cystoscopy. 2. Transurethral resection of prostate. 3. Urethral dilation. 4. Meatotomy. 5. Penile block.  FINDINGS:  A large obstructing prostate with trilobar hyperplasia. There was no regrowth of bladder tumor noted in the bladder.  ESTIMATED BLOOD LOSS:  100 mL.  COMPLICATIONS:  None.  SPECIMENS:  TURP chips sent for pathology.  HISTORY OF PRESENT ILLNESS:  This is a pleasant 65 year old gentleman who has a history of urinary obstruction due to prostatic hypertrophy. He also has a history of bladder cancer.  The patient was counseled regarding urinary obstruction and presented today for a TURP for urinary obstruction.  PROCEDURE:  Informed consent was obtained.  The patient was taken to the operating room where he was placed in supine position, IV antibiotics were infused, and general anesthesia was induced.  He was then placed in a dorsal lithotomy position making sure to pad all neurovascular pressure points.  His genitals were prepped and draped in usual sterile fashion.  Following this, time-out was performed in which the correct patient, surgical site, and procedure were agreed upon by the team.  Following this, meatotomy was performed in which straight Kelly clamps were placed on the ventral aspect of his urethral meatus and clamped and held for several minutes.  After this, this was cut with straight scissors and then minimal Bovie  electrocautery was used to maintain hemostasis.  There was not much cautery needed.  Next, urethral dilation was carried out with Sissy Hoff dilators from 16-French up to 30-French.  After this was done, the visual obturator to the gyrus resectoscope was placed through the urethra and into the bladder.  The bladder was evaluated and there were no regrowth of bladder tumors.  Bilateral ureteral orifices were identified and seen to be well away from the prostate.  Next, the loop to the gyrus resectoscope was used to resect at the 12 o'clock position down to the verumontanum.  The veru was then grooved bilaterally making sure to stay proximal to the urethral sphincter.  After this was done, resection was carried out on the floor of the prostate and then up the left side of the prostate down to the capsule.  The same was done on the right side taking the prostate down to the capsule.  The resection loop and the plasma button were both used during the resection.  After there was adequate resection, there was noted to be some tissue from the roof of the prostate that was obstructing and this was resected as well. Hemostasis was maintained with electrocautery.  After this was done, all prostate chips  were removed from the bladder.  These were sent to pathology for permanent section.  After this was done, the resectoscope was removed and a 22-French 3-way Foley catheter was placed with 30 cc of sterile water placed into the balloon.  A penile block was performed with 10 cc of Marcaine to help with the discomfort of the meatotomy. After this was done, the foreskin was reduced and the catheter was placed on the traction and connected with continuous bladder irrigation. Patient was placed back in a supine position.  This completed the procedure.  He was taken to the PACU in stable condition.  He will be kept overnight for observation.          ______________________________ Natalia Leatherwood,  MD     DW/MEDQ  D:  06/16/2011  T:  06/16/2011  Job:  [id number]

## 2011-06-17 NOTE — Anesthesia Postprocedure Evaluation (Signed)
  Anesthesia Post-op Note  Patient: Ryan Woodward  Procedure(s) Performed:  CYSTOSCOPY; TRANSURETHRAL RESECTION OF THE PROSTATE (TURP) - URETHRAL DILATATION, MEATOTOMY, PENIL BLOCK  Patient Location: PACU  Anesthesia Type: General  Level of Consciousness: awake and alert   Airway and Oxygen Therapy: Patient Spontanous Breathing  Post-op Pain: mild  Post-op Assessment: Post-op Vital signs reviewed, Patient's Cardiovascular Status Stable, Respiratory Function Stable, Patent Airway and No signs of Nausea or vomiting  Post-op Vital Signs: stable  Complications: No apparent anesthesia complications

## 2011-06-17 NOTE — Discharge Summary (Signed)
Physician Discharge Summary  Patient ID: Ryan Woodward MRN: [id number] DOB/AGE: 08-03-1945 65 y.o.  Admit date: 06/16/2011 Discharge date: 06/17/2011  Admission Diagnoses:  Discharge Diagnoses:  Active Problems:  Benign prostate hyperplasia   Discharged Condition: good  Hospital Course: Patient admitted overnight following TURP for continuous bladder irrigation.  He did will and was able to control his pain.  The following morning his bladder irrigation was turned off.  His urine remained clear with an orange tinge from his pyridium.  It was felt he could be discharged home.  Consults: none  Significant Diagnostic Studies: None  Treatments: surgery: TURP 06/16/11.  Discharge Exam: Blood pressure 140/70, pulse 52, temperature 97.3 F (36.3 C), temperature source Oral, resp. rate 18, height 6\' 4"  (1.93 m), weight 90.719 kg (200 lb), SpO2 95.00%. See progress note from day of discharge.  Disposition: Home or Self Care  Discharge Orders    Future Orders Please Complete By Expires   Discharge patient        Current Discharge Medication List    START taking these medications   Details  neomycin-bacitracin-polymyxin (NEOSPORIN) ointment Apply 1 application topically 3 (three) times daily as needed (catheter irritation). apply to tip of penis Qty: 15 g, Refills: 3    opium-belladonna (B&O SUPPRETTES) 16.2-60 MG suppository Place 1 suppository rectally every 6 (six) hours as needed. Qty: 40 suppository, Refills: 0    oxyCODONE (OXY IR/ROXICODONE) 5 MG immediate release tablet Take 1-2 tablets (5-10 mg total) by mouth every 4 (four) hours as needed. Qty: 60 tablet, Refills: 0    polyethylene glycol (MIRALAX / GLYCOLAX) packet Take 17 g by mouth daily. Qty: 14 each, Refills: 0    senna-docusate (SENOKOT-S) 8.6-50 MG per tablet Take 1 tablet by mouth 2 (two) times daily. Qty: 60 tablet, Refills: 0      CONTINUE these medications which have CHANGED   Details  phenazopyridine  (PYRIDIUM) 100 MG tablet Take 1 tablet (100 mg total) by mouth 3 (three) times daily as needed for pain. Qty: 30 tablet, Refills: 1      CONTINUE these medications which have NOT CHANGED   Details  azelastine (ASTELIN) 137 MCG/SPRAY nasal spray Place 1 spray into the nose as needed. Use in each nostril as directed     !! doxazosin (CARDURA) 4 MG tablet Take 4 mg by mouth at bedtime.      !! doxazosin (CARDURA) 8 MG tablet Take 8 mg by mouth every morning.      finasteride (PROSCAR) 5 MG tablet Take 5 mg by mouth daily.      fluticasone (FLONASE) 50 MCG/ACT nasal spray Place 2 sprays into the nose every morning.       !! - Potential duplicate medications found. Please discuss with provider.    STOP taking these medications     doxycycline (DORYX) 100 MG DR capsule        Follow-up Information    Follow up with Milford Cage, MD on 06/22/2011. (06/22/11 at 11:00 am)    Contact information:   [address] [facility] [place] [address] [phone]          Signed: Milford Cage 06/17/2011, 6:33 AM

## 2011-06-29 ENCOUNTER — Encounter (HOSPITAL_BASED_OUTPATIENT_CLINIC_OR_DEPARTMENT_OTHER): Payer: Self-pay | Admitting: Urology

## 2012-02-18 ENCOUNTER — Ambulatory Visit (HOSPITAL_COMMUNITY)
Admission: RE | Admit: 2012-02-18 | Discharge: 2012-02-18 | Disposition: A | Discharge status: Home / Self Care | Payer: Medicare Other | Source: Ambulatory Visit | Attending: Chiropractic Medicine | Admitting: Chiropractic Medicine

## 2012-02-18 ENCOUNTER — Other Ambulatory Visit (HOSPITAL_COMMUNITY): Payer: Self-pay | Admitting: Chiropractic Medicine

## 2012-02-18 DIAGNOSIS — M545 Low back pain, unspecified: Secondary | ICD-10-CM | POA: Insufficient documentation

## 2012-02-18 DIAGNOSIS — R52 Pain, unspecified: Secondary | ICD-10-CM

## 2012-02-18 DIAGNOSIS — G8929 Other chronic pain: Secondary | ICD-10-CM | POA: Insufficient documentation

## 2012-02-18 DIAGNOSIS — M51379 Other intervertebral disc degeneration, lumbosacral region without mention of lumbar back pain or lower extremity pain: Secondary | ICD-10-CM | POA: Insufficient documentation

## 2012-02-18 DIAGNOSIS — M5137 Other intervertebral disc degeneration, lumbosacral region: Secondary | ICD-10-CM | POA: Insufficient documentation

## 2014-10-10 DIAGNOSIS — C672 Malignant neoplasm of lateral wall of bladder: Secondary | ICD-10-CM | POA: Diagnosis not present

## 2015-01-22 DIAGNOSIS — M9903 Segmental and somatic dysfunction of lumbar region: Secondary | ICD-10-CM | POA: Diagnosis not present

## 2015-01-22 DIAGNOSIS — M9901 Segmental and somatic dysfunction of cervical region: Secondary | ICD-10-CM | POA: Diagnosis not present

## 2015-01-22 DIAGNOSIS — M542 Cervicalgia: Secondary | ICD-10-CM | POA: Diagnosis not present

## 2015-01-22 DIAGNOSIS — M545 Low back pain: Secondary | ICD-10-CM | POA: Diagnosis not present

## 2015-01-22 DIAGNOSIS — M546 Pain in thoracic spine: Secondary | ICD-10-CM | POA: Diagnosis not present

## 2015-01-22 DIAGNOSIS — M9902 Segmental and somatic dysfunction of thoracic region: Secondary | ICD-10-CM | POA: Diagnosis not present

## 2015-02-05 DIAGNOSIS — M9903 Segmental and somatic dysfunction of lumbar region: Secondary | ICD-10-CM | POA: Diagnosis not present

## 2015-02-05 DIAGNOSIS — M9901 Segmental and somatic dysfunction of cervical region: Secondary | ICD-10-CM | POA: Diagnosis not present

## 2015-02-05 DIAGNOSIS — M9902 Segmental and somatic dysfunction of thoracic region: Secondary | ICD-10-CM | POA: Diagnosis not present

## 2015-02-05 DIAGNOSIS — M542 Cervicalgia: Secondary | ICD-10-CM | POA: Diagnosis not present

## 2015-02-05 DIAGNOSIS — M545 Low back pain: Secondary | ICD-10-CM | POA: Diagnosis not present

## 2015-02-05 DIAGNOSIS — M546 Pain in thoracic spine: Secondary | ICD-10-CM | POA: Diagnosis not present

## 2015-02-20 DIAGNOSIS — M545 Low back pain: Secondary | ICD-10-CM | POA: Diagnosis not present

## 2015-02-20 DIAGNOSIS — M9902 Segmental and somatic dysfunction of thoracic region: Secondary | ICD-10-CM | POA: Diagnosis not present

## 2015-02-20 DIAGNOSIS — M9901 Segmental and somatic dysfunction of cervical region: Secondary | ICD-10-CM | POA: Diagnosis not present

## 2015-02-20 DIAGNOSIS — M546 Pain in thoracic spine: Secondary | ICD-10-CM | POA: Diagnosis not present

## 2015-02-20 DIAGNOSIS — M542 Cervicalgia: Secondary | ICD-10-CM | POA: Diagnosis not present

## 2015-02-20 DIAGNOSIS — M9903 Segmental and somatic dysfunction of lumbar region: Secondary | ICD-10-CM | POA: Diagnosis not present

## 2015-03-05 DIAGNOSIS — M542 Cervicalgia: Secondary | ICD-10-CM | POA: Diagnosis not present

## 2015-03-05 DIAGNOSIS — M546 Pain in thoracic spine: Secondary | ICD-10-CM | POA: Diagnosis not present

## 2015-03-05 DIAGNOSIS — M9901 Segmental and somatic dysfunction of cervical region: Secondary | ICD-10-CM | POA: Diagnosis not present

## 2015-03-05 DIAGNOSIS — M9903 Segmental and somatic dysfunction of lumbar region: Secondary | ICD-10-CM | POA: Diagnosis not present

## 2015-03-05 DIAGNOSIS — M9902 Segmental and somatic dysfunction of thoracic region: Secondary | ICD-10-CM | POA: Diagnosis not present

## 2015-03-05 DIAGNOSIS — M545 Low back pain: Secondary | ICD-10-CM | POA: Diagnosis not present

## 2015-03-11 DIAGNOSIS — M722 Plantar fascial fibromatosis: Secondary | ICD-10-CM | POA: Diagnosis not present

## 2015-03-11 DIAGNOSIS — M79609 Pain in unspecified limb: Secondary | ICD-10-CM | POA: Diagnosis not present

## 2015-04-09 DIAGNOSIS — M9901 Segmental and somatic dysfunction of cervical region: Secondary | ICD-10-CM | POA: Diagnosis not present

## 2015-04-09 DIAGNOSIS — M545 Low back pain: Secondary | ICD-10-CM | POA: Diagnosis not present

## 2015-04-09 DIAGNOSIS — M546 Pain in thoracic spine: Secondary | ICD-10-CM | POA: Diagnosis not present

## 2015-04-09 DIAGNOSIS — M9903 Segmental and somatic dysfunction of lumbar region: Secondary | ICD-10-CM | POA: Diagnosis not present

## 2015-04-09 DIAGNOSIS — M9902 Segmental and somatic dysfunction of thoracic region: Secondary | ICD-10-CM | POA: Diagnosis not present

## 2015-04-09 DIAGNOSIS — M542 Cervicalgia: Secondary | ICD-10-CM | POA: Diagnosis not present

## 2015-05-15 DIAGNOSIS — C672 Malignant neoplasm of lateral wall of bladder: Secondary | ICD-10-CM | POA: Diagnosis not present

## 2015-05-15 DIAGNOSIS — N529 Male erectile dysfunction, unspecified: Secondary | ICD-10-CM | POA: Diagnosis not present

## 2015-05-22 DIAGNOSIS — M5384 Other specified dorsopathies, thoracic region: Secondary | ICD-10-CM | POA: Diagnosis not present

## 2015-05-22 DIAGNOSIS — M9901 Segmental and somatic dysfunction of cervical region: Secondary | ICD-10-CM | POA: Diagnosis not present

## 2015-05-22 DIAGNOSIS — M9902 Segmental and somatic dysfunction of thoracic region: Secondary | ICD-10-CM | POA: Diagnosis not present

## 2015-05-22 DIAGNOSIS — M5386 Other specified dorsopathies, lumbar region: Secondary | ICD-10-CM | POA: Diagnosis not present

## 2015-05-22 DIAGNOSIS — M9903 Segmental and somatic dysfunction of lumbar region: Secondary | ICD-10-CM | POA: Diagnosis not present

## 2015-05-22 DIAGNOSIS — M5382 Other specified dorsopathies, cervical region: Secondary | ICD-10-CM | POA: Diagnosis not present

## 2015-06-24 DIAGNOSIS — M5386 Other specified dorsopathies, lumbar region: Secondary | ICD-10-CM | POA: Diagnosis not present

## 2015-06-24 DIAGNOSIS — M5382 Other specified dorsopathies, cervical region: Secondary | ICD-10-CM | POA: Diagnosis not present

## 2015-06-24 DIAGNOSIS — M9901 Segmental and somatic dysfunction of cervical region: Secondary | ICD-10-CM | POA: Diagnosis not present

## 2015-06-24 DIAGNOSIS — M9903 Segmental and somatic dysfunction of lumbar region: Secondary | ICD-10-CM | POA: Diagnosis not present

## 2015-06-24 DIAGNOSIS — M5384 Other specified dorsopathies, thoracic region: Secondary | ICD-10-CM | POA: Diagnosis not present

## 2015-06-24 DIAGNOSIS — M9902 Segmental and somatic dysfunction of thoracic region: Secondary | ICD-10-CM | POA: Diagnosis not present

## 2015-07-16 DIAGNOSIS — M9903 Segmental and somatic dysfunction of lumbar region: Secondary | ICD-10-CM | POA: Diagnosis not present

## 2015-07-16 DIAGNOSIS — M9902 Segmental and somatic dysfunction of thoracic region: Secondary | ICD-10-CM | POA: Diagnosis not present

## 2015-07-16 DIAGNOSIS — M5386 Other specified dorsopathies, lumbar region: Secondary | ICD-10-CM | POA: Diagnosis not present

## 2015-07-16 DIAGNOSIS — M9901 Segmental and somatic dysfunction of cervical region: Secondary | ICD-10-CM | POA: Diagnosis not present

## 2015-07-16 DIAGNOSIS — M5382 Other specified dorsopathies, cervical region: Secondary | ICD-10-CM | POA: Diagnosis not present

## 2015-07-16 DIAGNOSIS — M5384 Other specified dorsopathies, thoracic region: Secondary | ICD-10-CM | POA: Diagnosis not present

## 2015-08-15 DIAGNOSIS — C098 Malignant neoplasm of overlapping sites of tonsil: Secondary | ICD-10-CM | POA: Diagnosis not present

## 2015-08-15 DIAGNOSIS — Z720 Tobacco use: Secondary | ICD-10-CM | POA: Diagnosis not present

## 2016-02-12 ENCOUNTER — Other Ambulatory Visit: Payer: Self-pay | Admitting: Gastroenterology

## 2018-05-02 DIAGNOSIS — C44301 Unspecified malignant neoplasm of skin of nose: Secondary | ICD-10-CM

## 2018-05-02 HISTORY — DX: Unspecified malignant neoplasm of skin of nose: C44.301

## 2018-05-23 ENCOUNTER — Encounter: Payer: Self-pay | Admitting: Neurology

## 2018-05-23 ENCOUNTER — Telehealth: Payer: Self-pay | Admitting: Neurology

## 2018-05-23 ENCOUNTER — Ambulatory Visit: Payer: Medicare Other | Admitting: Neurology

## 2018-05-23 VITALS — BP 140/76 | HR 60 | Ht 76.0 in | Wt 184.0 lb

## 2018-05-23 DIAGNOSIS — R269 Unspecified abnormalities of gait and mobility: Secondary | ICD-10-CM

## 2018-05-23 DIAGNOSIS — G4752 REM sleep behavior disorder: Secondary | ICD-10-CM | POA: Diagnosis not present

## 2018-05-23 DIAGNOSIS — G20 Parkinson's disease: Secondary | ICD-10-CM | POA: Diagnosis not present

## 2018-05-23 DIAGNOSIS — R413 Other amnesia: Secondary | ICD-10-CM | POA: Diagnosis not present

## 2018-05-23 DIAGNOSIS — R4701 Aphasia: Secondary | ICD-10-CM | POA: Diagnosis not present

## 2018-05-23 DIAGNOSIS — G4750 Parasomnia, unspecified: Secondary | ICD-10-CM

## 2018-05-23 NOTE — Progress Notes (Signed)
[facility]    Provider:  Dr Jaynee Eagles Referring Provider: Lawerance Cruel, MD Primary Care Physician:  Lawerance Cruel, MD  CC:  memory loss  HPI:  Ryan Woodward is a 72 y.o. male here as requested by Dr. Harrington Challenger for memory problems.  Past medical history of atrial fibrillation, insomnia, memory difficulties, bladder cancer.  He is a former smoker. He feels he has memory problems. He can't think of a world, will remember in a few seconds, been ongoing for 5-6 months. Unclear if it is getting worse. A few times a day. Sometimes he describes the object and he is frustrated and stops. He has a partner for 39 years. His partner doesn't think there is anything wrong. 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 Mother had alzheimers. Mother started having memory issues at the age of 47. Still working and has his own business. He has noticed he goes to the computer to pay bills and he has to stop and think about what to do at the computer. He loses things in the house ongoing for a few months. Driving is fine, not gettig lost or accidents, he usually knows the day and date, he remembers important dates. Possibly after his bladder cancer chemo and surgery in 2013 his partner noticed he started having nightmares, always involves someone chasing him, then he wouldn't remember the dreams, act out the dreams and he talks gibberish in his sleep he has found his bed sheets on the floor, once he got up and was going to leave in his underwear and his partner stopped him. He notices things on the night stand on the floor. No falls or imbalance. No tremors. Voice is softer. No changes in handwriting. He has constipation and loss of smell.   Reviewed notes, labs and imaging from outside physicians, which showed:  Reviewed notes from referring physician Dr. Harrington Challenger.  Patient feels his memory has been progressively worsening especially in the months prior to last visit which was July 2019.  Memory is  gotten worse, dropping words, trouble finishing sentences.  Mother and maternal uncle had Alzheimer's as well as other relatives.  He feels as though he flails in bed, trazodone helps some, has grogginess in the morning.  B12 and thyroid were ordered at last appointment, also CMP was ordered, he takes trazodone at night for insomnia.  TSH 2.9, CMP unremarkable with BUN 13 and creatinine 0.91, vitamin B12 quantity was not sufficient for analysis  Review of Systems: Patient complains of symptoms per HPI as well as the following symptoms: dizziness, memory loss, afib, constipation. Pertinent negatives and positives per HPI. All others negative.   Social History   Socioeconomic History  . Marital status: Significant Other    Spouse name: Not on file  . Number of children: 0  . Years of education: Not on file  . Highest education level: Master's degree (e.g., MA, MS, MEng, MEd, MSW, MBA)  Occupational History  . Not on file  Social Needs  . Financial resource strain: Not on file  . Food insecurity:    Worry: Not on file    Inability: Not on file  . Transportation needs:    Medical: Not on file    Non-medical: Not on file  Tobacco Use  . Smoking status: Former Smoker    Last attempt to quit: 09/1985    Years since quitting: 32.7  . Smokeless tobacco: Never Used  Substance and Sexual Activity  . Alcohol  use: Yes    Alcohol/week: 14.0 standard drinks    Types: 14 Cans of beer per week    Comment: 2 beers daily  . Drug use: Never  . Sexual activity: Not on file  Lifestyle  . Physical activity:    Days per week: Not on file    Minutes per session: Not on file  . Stress: Not on file  Relationships  . Social connections:    Talks on phone: Not on file    Gets together: Not on file    Attends religious service: Not on file    Active member of club or organization: Not on file    Attends meetings of clubs or organizations: Not on file    Relationship status: Not on file  .  Intimate partner violence:    Fear of current or ex partner: Not on file    Emotionally abused: Not on file    Physically abused: Not on file    Forced sexual activity: Not on file  Other Topics Concern  . Not on file  Social History Narrative   Lives at home with partner   Right handed   Caffeine: 2 cups of coffee each morning     Family History  Problem Relation Age of Onset  . Alzheimer's disease Mother   . Stroke Mother   . Alzheimer's disease Maternal Uncle     Past Medical History:  Diagnosis Date  . A-fib ([place])   . Allergies   . Back pain   . Bladder cancer ([place]) dx aug'12   s/p turbt  . Enlarged prostate with urinary obstruction    nocturia/ freq/ dysuria  . Skin cancer of nose 05/2018   removal planned for Nov 2019    Past Surgical History:  Procedure Laterality Date  . CERVICAL FUSION  2000   C4-5  . COLONOSCOPY    . CYSTOSCOPY  06/16/2011   Procedure: CYSTOSCOPY;  Surgeon: Molli Hazard, MD;  Location: [facility];  Service: Urology;  Laterality: N/A;  . CYSTOSCOPY WITH URETHRAL DILATATION  w/ TURBT BX   05-04-11  . SINUS SURGERY    . TRANSURETHRAL RESECTION OF BLADDER TUMOR  AUG  '12  . TRANSURETHRAL RESECTION OF PROSTATE  06/16/2011   Procedure: TRANSURETHRAL RESECTION OF THE PROSTATE (TURP);  Surgeon: Molli Hazard, MD;  Location: [facility];  Service: Urology;  Laterality: N/A;  URETHRAL DILATATION, MEATOTOMY, PENIL BLOCK    Current Outpatient Medications  Medication Sig Dispense Refill  . aspirin EC 81 MG tablet Take 81 mg by mouth daily.    . fluticasone (FLONASE) 50 MCG/ACT nasal spray Place 2 sprays into the nose every morning.      . metoprolol tartrate (LOPRESSOR) 25 MG tablet Take 12.5 mg by mouth daily.    . Multiple Vitamins-Minerals (MULTIVITAMIN PO) Take by mouth.    . pantoprazole (PROTONIX) 40 MG tablet Take 40 mg by mouth daily as needed.    . Polyethylene Glycol 3350 (MIRALAX PO) Take by  mouth daily.     . sildenafil (REVATIO) 20 MG tablet Take 20 mg by mouth 3 (three) times daily as needed.    . traZODone (DESYREL) 50 MG tablet Take 25-50 mg by mouth at bedtime as needed for sleep.     Marland Kitchen zolpidem (AMBIEN) 10 MG tablet Take 10 mg by mouth at bedtime as needed for sleep.     No current facility-administered medications for this visit.  Allergies as of 05/23/2018  . (No Known Allergies)    Vitals: BP 140/76 (BP Location: Right Arm, Patient Position: Sitting)   Pulse 60   Ht 6\' 4"  (1.93 m)   Wt 184 lb (83.5 kg)   BMI 22.40 kg/m  Last Weight:  Wt Readings from Last 1 Encounters:  05/23/18 184 lb (83.5 kg)   Last Height:   Ht Readings from Last 1 Encounters:  05/23/18 6\' 4"  (1.93 m)   Physical exam: Exam: Gen: NAD, conversant, well nourised, thin, well groomed                     CV: RRR, no MRG. No Carotid Bruits. No peripheral edema, warm, nontender Eyes: Conjunctivae clear without exudates or hemorrhage  Neuro: Detailed Neurologic Exam  Speech:    Speech is normal; fluent and spontaneous with normal comprehension.  Cognition:  MMSE - Mini Mental State Exam 05/23/2018  Orientation to time 5  Orientation to Place 5  Registration 3  Attention/ Calculation 5  Recall 3  Language- name 2 objects 2  Language- repeat 1  Language- follow 3 step command 3  Language- read & follow direction 1  Write a sentence 1  Copy design 1  Total score 30       The patient is oriented to person, place, and time;     recent and remote memory intact;     language fluent;     normal attention, concentration,     fund of knowledge Cranial Nerves: mild hypomimia    The pupils are round, anisocoria right 3mm larger but appear to be equally reactive to light. Attempted fundoscopic exam could not visualize.  Visual fields are full to finger confrontation. Mildly impaired upgaze otherwise extraocular movements are intact. Trigeminal sensation is intact and the muscles of  mastication are normal. The face is symmetric. The palate elevates in the midline. Hearing intact. Voice is hypophonic. Shoulder shrug is normal. The tongue has normal motion without fasciculations.   Coordination:    Normal finger to nose and heel to shin. Normal finger taps  Gait:    Heel-toe normal, and tandem gait with minimal imbalance. Decreased arm swing on the right.  Motor Observation:    No asymmetry, no atrophy, and no involuntary movements noted. Tone:    Normal muscle tone.  No cogwheeling.   Posture:    Posture is normal. normal erect    Strength:    Strength is V/V in the upper and lower limbs.      Sensation: intact to LT     Reflex Exam:  DTR's: absent AJs. 2+ right patella with crossed reflex, trace left patellar, 1+ biceps   Toes: left down, right equiv     Clonus:    Clonus is absent. Neg hoffman.        Assessment/Plan:  [age] year old with 1st degree FHx of Dementia in mother (Alzheimers?). He is exhibiting aphasia and memory loss. Also parkinsonian signs such as decreased arm swing, hypomimia, hypophonia, possible REM Sleep disorder or PLMS, loss of smell and constipation.   Will send to Dr. Brett Fairy for sleep eval of PLMS vs REM Sleep Disorder or other parasomnia: In 2013 his partner noticed he started having nightmares, always involves someone chasing him, he would act out the dreams and he talks gibberish in his sleep - also he has found his bed sheets on the floor, once he got up and was going to leave in his  underwear and his partner stopped him. He notices things on the night stand on the floor. He thrashes in bed. Not snoring.   MRI brain w/wo contrast for reversible causes of dementia Formal neurocognitive testing   DAT Scan to evauate for Lewy body Dementia or other parkinsonian disorders given clinical exam. He has aphasia which could be more FTD, consider PET Scan in the future if DAT is negative.   Speech therapy for aphasia  Orders  Placed This Encounter  Procedures  . MR BRAIN W WO CONTRAST  . NM BRAIN IMAGING W/SPECT (DATSCAN)  . Basic Metabolic Panel  . Ambulatory referral to Sleep Studies  . Ambulatory referral to Neuropsychology  . Ambulatory referral to Speech Therapy   Cc: Dr. Lovena Le, [facility] [address] [place], [place] [address]  Phone [phone] Fax [phone]

## 2018-05-23 NOTE — Telephone Encounter (Signed)
[facility] Medicare order sent to GI. No auth lvm for pt to be aware of this. I also left GI phone number of [phone] and to call if he has not heard in the next 2-3 business days.

## 2018-05-23 NOTE — Patient Instructions (Signed)
Will send to Dr. Brett Fairy for sleep eval of PLMS vs REM Sleep Disorder or other parasomnia  MRI brain w/wo contrast for reversible causes of dementia  Formal neurocognitive testing   DAT Scan to evauate for Lewy body Dementia or other parkinsonian spectrum disorders given clinical exam and history.   Speech therapy

## 2018-05-24 ENCOUNTER — Encounter: Payer: Self-pay | Admitting: Neurology

## 2018-05-24 DIAGNOSIS — R413 Other amnesia: Secondary | ICD-10-CM | POA: Insufficient documentation

## 2018-05-24 LAB — BASIC METABOLIC PANEL
BUN/Creatinine Ratio: 17 (ref 10–24)
BUN: 16 mg/dL (ref 8–27)
CHLORIDE: 106 mmol/L (ref 96–106)
CO2: 25 mmol/L (ref 20–29)
Calcium: 8.9 mg/dL (ref 8.6–10.2)
Creatinine, Ser: 0.92 mg/dL (ref 0.76–1.27)
GFR calc non Af Amer: 83 mL/min/{1.73_m2} (ref >59)
GFR, EST AFRICAN AMERICAN: 96 mL/min/{1.73_m2} (ref >59)
Glucose: 84 mg/dL (ref 65–99)
Potassium: 4.5 mmol/L (ref 3.5–5.2)
Sodium: 146 mmol/L — ABNORMAL HIGH (ref 134–144)

## 2018-05-30 ENCOUNTER — Telehealth: Payer: Self-pay | Admitting: *Deleted

## 2018-05-30 NOTE — Telephone Encounter (Signed)
*  STAT* If patient is at the pharmacy, call can be transferred to refill team.   1. Which medications need to be refilled? (please list name of each medication and dose if known) Metoprolol Succinate ER 25 MG Takes 0.5 tab qd  2. Which pharmacy/location (including street and city if local pharmacy) is medication to be sent to?[facility] [address] in [place]  3. Do they need a 30 day or 90 day supply? [place]

## 2018-05-31 MED ORDER — METOPROLOL SUCCINATE ER 25 MG PO TB24: 12.5000 mg | ORAL_TABLET | Freq: Every day | ORAL | 1 refills | Status: DC

## 2018-05-31 NOTE — Telephone Encounter (Signed)
[facility] [place] Patient is due for follow up in February 2020. Will have front desk call patient to schedule an appointment.

## 2018-05-31 NOTE — Telephone Encounter (Signed)
This was sent to me yesterday, please have BJM review.

## 2018-06-14 ENCOUNTER — Ambulatory Visit
Admission: RE | Admit: 2018-06-14 | Discharge: 2018-06-14 | Disposition: A | Discharge status: Home / Self Care | Payer: Medicare Other | Source: Ambulatory Visit | Attending: Neurology | Admitting: Neurology

## 2018-06-14 DIAGNOSIS — G20 Parkinson's disease: Secondary | ICD-10-CM

## 2018-06-14 DIAGNOSIS — R413 Other amnesia: Secondary | ICD-10-CM

## 2018-06-14 DIAGNOSIS — R4701 Aphasia: Secondary | ICD-10-CM

## 2018-06-14 MED ORDER — GADOBENATE DIMEGLUMINE 529 MG/ML IV SOLN
18.0000 mL | Freq: Once | INTRAVENOUS | Status: AC | PRN
  Administered 2018-06-14: 18 mL via INTRAVENOUS

## 2018-06-19 ENCOUNTER — Telehealth: Payer: Self-pay | Admitting: *Deleted

## 2018-06-19 NOTE — Telephone Encounter (Signed)
-----   Message from Melvenia Beam, MD sent at 06/15/2018  3:55 PM EST ----- MRI of the brain is normal. This does not rule out parkinson's disease, dementia or other conditions. STill go forward with the rest of the testing. thanks

## 2018-06-19 NOTE — Telephone Encounter (Signed)
Called and spoke to Patient he needs to keep his DAT scan apt Patient will have three sections of test . 07/13/2018  Myriam Jacobson He is sch for an apt with Dr. Brett Fairy same day as DAT scan is there any way you can call him and give him another apt please.  I didn't CX 07/13/2018 apt with Dr. Brett Fairy  . Thanks [employer] .

## 2018-06-19 NOTE — Telephone Encounter (Signed)
Reviewed MRI brain results with pt. Informed him that MRI of the brain is normal. This does not rule out parkinson's disease, dementia or other conditions. STill go forward with the rest of the testing. Patient verbalized appreciation. He wanted to make sure that it was ok for him to keep his current schedule of appts on 07/13/18: 8 AM datscan appt, 10 AM Dr. Brett Fairy, 1 PM datscan. RN advised that she would send a message to another staff member to check and we would call him back. Advised pt to leave everything as is for now. He verbalized appreciation.

## 2018-06-19 NOTE — Telephone Encounter (Addendum)
Noted thank you Myriam Jacobson and Hinton Dyer.

## 2018-06-20 NOTE — Telephone Encounter (Signed)
I have placed the patients name on wait list and gave his information to the sleep lab scheduler also in case a opening comes up and we can work the patient in sooner.

## 2018-06-26 ENCOUNTER — Encounter: Payer: Self-pay | Admitting: Neurology

## 2018-06-26 ENCOUNTER — Ambulatory Visit: Payer: Medicare Other | Admitting: Neurology

## 2018-06-26 VITALS — BP 129/72 | HR 51 | Ht 76.0 in | Wt 185.0 lb

## 2018-06-26 DIAGNOSIS — G4752 REM sleep behavior disorder: Secondary | ICD-10-CM | POA: Diagnosis not present

## 2018-06-26 DIAGNOSIS — R430 Anosmia: Secondary | ICD-10-CM

## 2018-06-26 DIAGNOSIS — M6289 Other specified disorders of muscle: Secondary | ICD-10-CM

## 2018-06-26 DIAGNOSIS — F513 Sleepwalking [somnambulism]: Secondary | ICD-10-CM | POA: Diagnosis not present

## 2018-06-26 NOTE — Progress Notes (Signed)
SLEEP MEDICINE CLINIC   Provider:  Larey Seat, M D  Primary Care Physician:  Lawerance Cruel, MD   Referring Provider: Sarina Ill. MD   Chief Complaint  Patient presents with  . New Patient (Initial Visit)    Sleep consult. Alone. Rm 10. Patient has some concens about unusual behaviors while he is sleeping( see list)    HPI:  Ryan Woodward is a 72 y.o. male patient seen here 06-26-2018  in a referral from Dr Jaynee Eagles  Via Dr. Harrington Challenger for a sleep evaluation.    Chief complaint according to patient : " I wake up fighting, flailing, kicking, sometimes talking jibberish". My partner caught me two nights ago leaving the bed. The most significant episode was in a [place] hotel on vacation. He was just in time hindered to leave the room in his underwear.  He remains unaware of these spells, and doesn't remember.  He is usually someone who easily goes to sleep, but over the last 3-5 years has had nightmares more and more frequently and has problems sleeping through the night.    Sleep habits are as follows: The average dinnertime for Mr. Horsey and his partner is around 6 not later than 7, and is usually without alcohol.  Bedtime is around 10 PM.  The bedroom is cool, quiet but not  dark  ( a low volume TV runs in the background) and Mr. Springer has no trouble initiating sleep at first. He sleeps on his sides and uses a thick pillow for head and neck support.  No RLS. He wakes up with 2 hours for nocturition. His partner noted his activity - acing out dreams- within the first 2 hours of sleep. He has never fallen out of bed. He has pushed things off the night stand. This happened with increasing frequency. By 2-3 AM he takes a trazodone to get more sleep. This on 3 night of the week. Currently no Ambien 9 this was only used for a few days after [facility] surgery).    CC and medical history as obtained in a visit with Dr Jaynee Eagles: CC:  memory loss HPI:  Ryan Woodward is a 72 y.o. male here as  requested by Dr. Harrington Challenger for memory problems.  Past medical history of atrial fibrillation, insomnia, memory difficulties, bladder cancer.  He is a former smoker. He feels he has memory problems. He can't think of a world, will remember in a few seconds, been ongoing for 5-6 months. Unclear if it is getting worse. A few times a day. Sometimes he describes the object and he is frustrated and stops. He has a partner for 39 years. His partner doesn't think there is anything wrong. 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 Mother had Alzheimer's. Mother started having memory issues at the age of 45. Still working and has his own business. He has noticed he goes to the computer to pay bills and he has to stop and think about what to do at the computer. He loses things in the house ongoing for a few months. Driving is fine, not gettig lost or accidents, he usually knows the day and date, he remembers important dates. Possibly after his bladder cancer chemo and surgery in 2013 his partner noticed he started having nightmares, always involves someone chasing him, then he wouldn't remember the dreams, act out the dreams and he talks gibberish in his sleep he has found his bed sheets on the floor, once he  got up and was going to leave in his underwear and his partner stopped him. He notices things on the night stand on the floor. No falls or imbalance. No tremors. Voice is softer. No changes in handwriting. He has constipation and loss of smell.   Reviewed notes, labs and imaging from outside physicians, which showed: 06-26-2018  DAT scan pending.  MRI brain was normal, last week .   Reviewed notes from referring physician Dr. Harrington Challenger.  Patient feels his memory has been progressively worsening especially in the months prior to last visit which was July 2019.  Memory is gotten worse, dropping words, trouble finishing sentences.  Mother and maternal uncle had Alzheimer's as well as other relatives.  He feels as though  he flails in bed, trazodone helps some, has grogginess in the morning.  B12 and thyroid were ordered at last appointment, also CMP was ordered, he takes trazodone at night for insomnia.  TSH 2.9, CMP unremarkable with BUN 13 and creatinine 0.91, vitamin B12 quantity was not sufficient for analysis  Social history: "Cletus Gash" is his partner.   Master's degree, retired Pharmacist, hospital. Herbalist work.  Former smoker, quit 1988. ETOH 3 glasses of wine with dinner or 2 light beers. Coffee - 2 cups a day.   Review of Systems: Out of a complete 14 system review, the patient complains of only the following symptoms, and all other reviewed systems are negative. Acting out dreams, some sleep walking. Parasomnia.    Epworth Sleepiness score endorsed at 11/ 24  , the Fatigue severity score at n/a  , geriatric depression score; n/a    Social History   Socioeconomic History  . Marital status: Significant Other    Spouse name: Not on file  . Number of children: 0  . Years of education: Not on file  . Highest education level: Master's degree (e.g., MA, MS, MEng, MEd, MSW, MBA)  Occupational History  . Not on file  Social Needs  . Financial resource strain: Not on file  . Food insecurity:    Worry: Not on file    Inability: Not on file  . Transportation needs:    Medical: Not on file    Non-medical: Not on file  Tobacco Use  . Smoking status: Former Smoker    Last attempt to quit: 09/1985    Years since quitting: 32.8  . Smokeless tobacco: Never Used  Substance and Sexual Activity  . Alcohol use: Yes    Alcohol/week: 14.0 standard drinks    Types: 14 Cans of beer per week    Comment: 2 beers daily  . Drug use: Never  . Sexual activity: Not on file  Lifestyle  . Physical activity:    Days per week: Not on file    Minutes per session: Not on file  . Stress: Not on file  Relationships  . Social connections:    Talks on phone: Not on file    Gets together: Not on file    Attends  religious service: Not on file    Active member of club or organization: Not on file    Attends meetings of clubs or organizations:     Relationship status: Lives with his partner " Cletus Gash "   . Intimate partner violence:    Fear of current or ex partner:     Emotionally abused:     Physically abused:     Forced sexual activity:   Other Topics Concern  . Not on file  Social  History Narrative   Lives at home with partner   Right handed   Caffeine: 2 cups of coffee each morning     Family History  Problem Relation Age of Onset  . Alzheimer's disease Mother   . Stroke Mother   . Alzheimer's disease Maternal Uncle     Past Medical History:  Diagnosis Date  . A-fib ([place])   . Allergies   . Back pain   . Bladder cancer ([place]) dx aug'12   s/p turbt  . Enlarged prostate with urinary obstruction    nocturia/ freq/ dysuria  . Skin cancer of nose 05/2018   removal planned for Nov 2019    Past Surgical History:  Procedure Laterality Date  . CERVICAL FUSION  2000   C4-5  . COLONOSCOPY    . CYSTOSCOPY  06/16/2011   Procedure: CYSTOSCOPY;  Surgeon: Molli Hazard, MD;  Location[facility];  Service: Urology;  Laterality: N/A;  . CYSTOSCOPY WITH URETHRAL DILATATION  w/ TURBT BX   05-04-11  . SINUS SURGERY    . TRANSURETHRAL RESECTION OF BLADDER TUMOR  AUG  '12  . TRANSURETHRAL RESECTION OF PROSTATE  06/16/2011   Procedure: TRANSURETHRAL RESECTION OF THE PROSTATE (TURP);  Surgeon: Molli Hazard, MD;  [facility];  Service: Urology;  Laterality: N/A;  URETHRAL DILATATION, MEATOTOMY, PENIL BLOCK    Current Outpatient Medications  Medication Sig Dispense Refill  . aspirin EC 81 MG tablet Take 81 mg by mouth daily.    . fluticasone (FLONASE) 50 MCG/ACT nasal spray Place 2 sprays into the nose every morning.      . metoprolol succinate (TOPROL-XL) 25 MG 24 hr tablet Take 0.5 tablets (12.5 mg total) by mouth daily. 30 tablet 1  .  Multiple Vitamins-Minerals (MULTIVITAMIN PO) Take by mouth.    . pantoprazole (PROTONIX) 40 MG tablet Take 40 mg by mouth daily as needed.    . Polyethylene Glycol 3350 (MIRALAX PO) Take by mouth daily.     . sildenafil (REVATIO) 20 MG tablet Take 20 mg by mouth 3 (three) times daily as needed.    . traZODone (DESYREL) 50 MG tablet Take 25-50 mg by mouth at bedtime as needed for sleep.     Marland Kitchen zolpidem (AMBIEN) 10 MG tablet Take 10 mg by mouth at bedtime as needed for sleep.     No current facility-administered medications for this visit.     Allergies as of 06/26/2018  . (No Known Allergies)    Vitals: BP 129/72   Pulse (!) 51   Ht 6\' 4"  (1.93 m)   Wt 185 lb (83.9 kg)   BMI 22.52 kg/m  Last Weight:  Wt Readings from Last 1 Encounters:  06/26/18 185 lb (83.9 kg)   WUJ:WJXB mass index is 22.52 kg/m.     Last Height:   Ht Readings from Last 1 Encounters:  06/26/18 6\' 4"  (1.93 m)    Physical exam:  General: The patient is awake, alert and appears not in acute distress. The patient is well groomed. Head: Normocephalic, atraumatic. Neck is supple. Mallampati 2- neck circumference:14.5 . Nasal airflow patent - Retrognathia is seen.  Cardiovascular:  Regular rate and rhythm, without  murmurs or carotid bruit, and without distended neck veins. Respiratory: Lungs are clear to auscultation. Skin:  Without evidence of edema, or rash Trunk: BMI is 22 . The patient's posture is erect  Neurologic exam : The patient is awake and alert, oriented to place and  time.   Memory subjective described as intact.  Memory testing revealed:   MOCA:No flowsheet data found. MMSE: MMSE - Mini Mental State Exam 05/23/2018  Orientation to time 5  Orientation to Place 5  Registration 3  Attention/ Calculation 5  Recall 3  Language- name 2 objects 2  Language- repeat 1  Language- follow 3 step command 3  Language- read & follow direction 1  Write a sentence 1  Copy design 1  Total score 30        Attention span & concentration ability appears normal.  Speech is fluent,  With dysphonia or aphasia.  Mood and affect are worried, anxious.   Cranial nerves: had Moh's surgery right eye, inner angle of the eye to right nasion.   Loss of smell- gradually over years- 7-10 years.  Pupils are equal and briskly reactive to light. Funduscopic exam without evidence of pallor or edema. Extraocular movements  in vertical and horizontal planes intact and without nystagmus. Visual fields by finger perimetry are intact. Hearing to finger rub intact.   Facial sensation intact to fine touch.  Facial motor strength is symmetric and tongue and uvula move midline. No tremor of the tongue. Shoulder shrug was symmetrical.   Motor exam:  Increased  tone, cogwheel rigor over both beceps , wrist and triceps. Has slim built , low muscle bulk and symmetric strength in all extremities. ROM in left shoulder sightly restricted.   Sensory:  Fine touch, pinprick and vibration were tested in all extremities. Proprioception tested in the upper extremities was normal.  Coordination: reports no major changes in penmanship. Rapid alternating movements in the fingers/hands was normal. Finger-to-nose maneuver normal without evidence of ataxia, dysmetria or tremor.  Gait and station: Patient walks without assistive device and is able unassisted to climb up to the exam table. Strength within normal limits. Stance is stable and normal.  Toe and heel stand were - deferred. Tandem gait is deferred. Turns with 3 Steps ( reports slight dizziness, either direction) . Romberg testing was deferred. He reports retropulsion when looking up.  Deep tendon reflexes: in the upper and lower extremities are symmetric and intact. Babinski maneuver response is downgoing.  Assessment:  After physical and neurologic examination, review of laboratory studies,  Personal review of imaging studies, reports of other /same  Imaging studies,  results of polysomnography and / or neurophysiology testing and pre-existing records as far as provided in visit., my assessment is   This is REM behaviour disorder and associated with cog-wheeling and loss of smell can indicate the presence of PD early stages or Lewy body disease. Walking out of the bedroom in a somnambulism state is unusual, especially if not medication or alcohol related.   1) no evidence of Dementia by MMSE- needs a MOCA !  2) reduce alcohol use. 1 glass a day is OK, take multivitamin ( B vitamins are very important)  , and continue exercise.  Eliminate the TV and all electronic light. Take a low dose melatonin 5 mg or less. Try under the tongue formulation.   3)  I ordered a parasomnia PSG. Next visit with [facility] !    The patient was advised of the nature of the diagnosed disorder , the treatment options and the  risks for general health and wellness arising from not treating the condition.   I spent more than 50  minutes of face to face time with the patient.  Greater than 50% of time was spent in counseling and  coordination of care. We have discussed the diagnosis and differential and I answered the patient's questions.     Larey Seat, MD 13/24/4010, 2:72 AM  Certified in Neurology by ABPN Certified in [place] by [facility] [address] [place], [place] [address]

## 2018-07-13 ENCOUNTER — Institutional Professional Consult (permissible substitution): Payer: Medicare Other | Admitting: Neurology

## 2018-07-13 ENCOUNTER — Ambulatory Visit (HOSPITAL_COMMUNITY): Payer: Medicare Other

## 2018-07-20 ENCOUNTER — Encounter (HOSPITAL_COMMUNITY)
Admission: RE | Admit: 2018-07-20 | Discharge: 2018-07-20 | Disposition: A | Discharge status: Home / Self Care | Payer: Medicare Other | Source: Ambulatory Visit | Attending: Neurology | Admitting: Neurology

## 2018-07-20 DIAGNOSIS — R413 Other amnesia: Secondary | ICD-10-CM | POA: Diagnosis present

## 2018-07-20 DIAGNOSIS — G4752 REM sleep behavior disorder: Secondary | ICD-10-CM | POA: Insufficient documentation

## 2018-07-20 DIAGNOSIS — R269 Unspecified abnormalities of gait and mobility: Secondary | ICD-10-CM | POA: Diagnosis present

## 2018-07-20 DIAGNOSIS — G20 Parkinson's disease: Secondary | ICD-10-CM | POA: Diagnosis present

## 2018-07-20 DIAGNOSIS — R4701 Aphasia: Secondary | ICD-10-CM | POA: Diagnosis not present

## 2018-07-20 MED ORDER — IOFLUPANE I 123 185 MBQ/2.5ML IV SOLN
4.6500 | Freq: Once | INTRAVENOUS | Status: AC
  Administered 2018-07-20: 4.65 via INTRAVENOUS

## 2018-07-20 MED ORDER — IODINE STRONG (LUGOLS) 5 % PO SOLN
0.8000 mL | Freq: Once | ORAL | Status: DC
  Filled 2018-07-20: qty 0.8

## 2018-07-26 ENCOUNTER — Telehealth: Payer: Self-pay | Admitting: Neurology

## 2018-07-26 NOTE — Telephone Encounter (Signed)
Ryan Woodward, please call patient and discuss. You may also ask the work-in doctor to call if you like. Results of DAT scan below. They can set up a follow up with me possibly January 10th if you can ask Jinny Blossom to open it up for follow ups thank you.  Loss of dopamine transport populations within the LEFT and RIGHT putamen is a pattern consistent with Parkinson's syndrome pathology.

## 2018-07-31 NOTE — Telephone Encounter (Signed)
Called pt and LVM (ok per DPR) asking for call back to discuss his DAT scan results. Left office number and hours in message including closure for [place] holiday.

## 2018-07-31 NOTE — Telephone Encounter (Signed)
Called pt and discussed DAT scan results. Advised him that scan shows consistencies with a parkinsonian syndrome however scan is not definitive, just is in keeping with a parkinsonian syndrome. Pt verbalized understanding and was very appreciative of the opportunity to come into the office to discuss this further with Dr. Jaynee Eagles. RN scheduled pt for Fri 08/11/18 at 09:30 AM arrival 09:15.

## 2018-07-31 NOTE — Telephone Encounter (Signed)
Patient is returning a call for DAT scan results.

## 2018-07-31 NOTE — Telephone Encounter (Signed)
Spoke with Dr. Rexene Alberts. Scan is not definitive but is in keeping with a parkinsonian syndrome.

## 2018-08-01 NOTE — Telephone Encounter (Signed)
Yes please cancel f/u since he will be seen on the 10th thanks

## 2018-08-03 NOTE — Telephone Encounter (Signed)
Called pt and informed him that we will cancel his February appt since he is going to be seen earlier on Aug 11, 2018 @ 09:30. Pt verbalized understanding and appreciation.   Appt canceled 2/25.

## 2018-08-11 ENCOUNTER — Ambulatory Visit: Payer: Medicare Other | Admitting: Neurology

## 2018-08-11 ENCOUNTER — Encounter: Payer: Self-pay | Admitting: Neurology

## 2018-08-11 VITALS — BP 143/76 | HR 55 | Ht 76.0 in | Wt 184.0 lb

## 2018-08-11 DIAGNOSIS — G20 Parkinson's disease: Secondary | ICD-10-CM | POA: Insufficient documentation

## 2018-08-11 DIAGNOSIS — F0280 Dementia in other diseases classified elsewhere without behavioral disturbance: Secondary | ICD-10-CM | POA: Diagnosis not present

## 2018-08-11 MED ORDER — CARBIDOPA-LEVODOPA 25-100 MG PO TABS: ORAL_TABLET | ORAL | 6 refills | Status: DC

## 2018-08-11 MED ORDER — CLONAZEPAM 0.5 MG PO TABS: ORAL_TABLET | ORAL | 4 refills | Status: DC

## 2018-08-11 NOTE — Patient Instructions (Addendum)
- Referral to [facility] who is a specialist in Parkinson's Disease - Referral to Social Work at [facility] - information on community resouces provided - stressed exercise, see literature provided - Clonazepam for REM sleep disorder - Sleep eval scheduled next week - Formal neurocognitive testing - Sinemet(Carbidopa/Levodopa) -Try to separate Sinemet from food (especially protein-rich foods like meat, dairy, eggs) by about 30-60 mins - this will help the absorption of the medication. If you have some nausea with the medication, you can take it with some light food like crackers or ginger ale - follow-up 3 months or sooner if needed  Clonazepam tablets What is this medicine? CLONAZEPAM (kloe NA ze pam) is a benzodiazepine. It is used to treat certain types of seizures. It is also used to treat panic disorder. This medicine may be used for other purposes; ask your health care provider or pharmacist if you have questions. COMMON BRAND NAME(S): Ceberclon, Klonopin What should I tell my health care provider before I take this medicine? They need to know if you have any of these conditions: -an alcohol or drug abuse problem -bipolar disorder, depression, psychosis or other mental health condition -glaucoma -kidney or liver disease -lung or breathing disease -myasthenia gravis -Parkinson's disease -porphyria -seizures or a history of seizures -suicidal thoughts -an unusual or allergic reaction to clonazepam, other benzodiazepines, foods, dyes, or preservatives -pregnant or trying to get pregnant -breast-feeding How should I use this medicine? Take this medicine by mouth with a glass of water. Follow the directions on the prescription label. If it upsets your stomach, take it with food or milk. Take your medicine at regular intervals. Do not take it more often than directed. Do not stop taking or change the dose except on the advice of your doctor or health care professional. A special  MedGuide will be given to you by the pharmacist with each prescription and refill. Be sure to read this information carefully each time. Talk to your pediatrician regarding the use of this medicine in children. Special care may be needed. Overdosage: If you think you have taken too much of this medicine contact a poison control center or emergency room at once. NOTE: This medicine is only for you. Do not share this medicine with others. What if I miss a dose? If you miss a dose, take it as soon as you can. If it is almost time for your next dose, take only that dose. Do not take double or extra doses. What may interact with this medicine? Do not take this medication with any of the following medicines: -narcotic medicines for cough -sodium oxybate This medicine may also interact with the following medications: -alcohol -antihistamines for allergy, cough and cold -antiviral medicines for HIV or AIDS -certain medicines for anxiety or sleep -certain medicines for depression, like amitriptyline, fluoxetine, sertraline -certain medicines for fungal infections like ketoconazole and itraconazole -certain medicines for seizures like carbamazepine, phenobarbital, phenytoin, primidone -general anesthetics like halothane, isoflurane, methoxyflurane, propofol -local anesthetics like lidocaine, pramoxine, tetracaine -medicines that relax muscles for surgery -narcotic medicines for pain -phenothiazines like chlorpromazine, mesoridazine, prochlorperazine, thioridazine This list may not describe all possible interactions. Give your health care provider a list of all the medicines, herbs, non-prescription drugs, or dietary supplements you use. Also tell them if you smoke, drink alcohol, or use illegal drugs. Some items may interact with your medicine. What should I watch for while using this medicine? Tell your doctor or health care professional if your symptoms do not  start to get better or if they get  worse. Do not stop taking except on your doctor's advice. You may develop a severe reaction. Your doctor will tell you how much medicine to take. You may get drowsy or dizzy. Do not drive, use machinery, or do anything that needs mental alertness until you know how this medicine affects you. To reduce the risk of dizzy and fainting spells, do not stand or sit up quickly, especially if you are an older patient. Alcohol may increase dizziness and drowsiness. Avoid alcoholic drinks. If you are taking another medicine that also causes drowsiness, you may have more side effects. Give your health care provider a list of all medicines you use. Your doctor will tell you how much medicine to take. Do not take more medicine than directed. Call emergency for help if you have problems breathing or unusual sleepiness. The use of this medicine may increase the chance of suicidal thoughts or actions. Pay special attention to how you are responding while on this medicine. Any worsening of mood, or thoughts of suicide or dying should be reported to your health care professional right away. What side effects may I notice from receiving this medicine? Side effects that you should report to your doctor or health care professional as soon as possible: -allergic reactions like skin rash, itching or hives, swelling of the face, lips, or tongue -breathing problems -confusion -loss of balance or coordination -signs and symptoms of low blood pressure like dizziness; feeling faint or lightheaded, falls; unusually weak or tired -suicidal thoughts or mood changes Side effects that usually do not require medical attention (report to your doctor or health care professional if they continue or are bothersome): -dizziness -headache -tiredness -upset stomach This list may not describe all possible side effects. Call your doctor for medical advice about side effects. You may report side effects to FDA at 1-800-FDA-1088. Where  should I keep my medicine? Keep out of the reach of children. This medicine can be abused. Keep your medicine in a safe place to protect it from theft. Do not share this medicine with anyone. Selling or giving away this medicine is dangerous and against the law. This medicine may cause accidental overdose and death if taken by other adults, children, or pets. Mix any unused medicine with a substance like cat litter or coffee grounds. Then throw the medicine away in a sealed container like a sealed bag or a coffee can with a lid. Do not use the medicine after the expiration date. Store at room temperature between 15 and 30 degrees C (59 and 86 degrees F). Protect from light. Keep container tightly closed. NOTE: This sheet is a summary. It may not cover all possible information. If you have questions about this medicine, talk to your doctor, pharmacist, or health care provider.  2019 [employer]/Gold Standard (2015-12-26 18:46:32)    Carbidopa; Levodopa tablets What is this medicine? CARBIDOPA;LEVODOPA (kar bi DOE pa; lee voe DOE pa) is used to treat the symptoms of Parkinson's disease. This medicine may be used for other purposes; ask your health care provider or pharmacist if you have questions. COMMON BRAND NAME(S): Atamet, SINEMET What should I tell my health care provider before I take this medicine? They need to know if you have any of these conditions: -asthma or lung disease -depression or other mental illness -diabetes -glaucoma -heart disease, including history of a heart attack -irregular heart beat -kidney or liver disease -melanoma or suspicious skin lesions -stomach or intestine ulcers -  an unusual or allergic reaction to levodopa, carbidopa, other medicines, foods, dyes, or preservatives -pregnant or trying to get pregnant -breast-feeding How should I use this medicine? Take this medicine by mouth with a glass of water. Follow the directions on the prescription label. Take your  doses at regular intervals. Do not take your medicine more often than directed. Do not stop taking except on the advice of your doctor or health care professional. Talk to your pediatrician regarding the use of this medicine in children. Special care may be needed. Overdosage: If you think you have taken too much of this medicine contact a poison control center or emergency room at once. NOTE: This medicine is only for you. Do not share this medicine with others. What if I miss a dose? If you miss a dose, take it as soon as you can. If it is almost time for your next dose, take only that dose. Do not take double or extra doses. What may interact with this medicine? Do not take this medicine with any of the following medications: -MAOIs like Marplan, Nardil, and Parnate -reserpine -tetrabenazine This medicine may also interact with the following medications: -alcohol -droperidol -entacapone -iron supplements or multivitamins with iron -isoniazid, INH -linezolid -medicines for depression, anxiety, or psychotic disturbances -medicines for high blood pressure -medicines for sleep -metoclopramide -papaverine -procarbazine -tedizolid -rasagiline -selegiline -tolcapone This list may not describe all possible interactions. Give your health care provider a list of all the medicines, herbs, non-prescription drugs, or dietary supplements you use. Also tell them if you smoke, drink alcohol, or use illegal drugs. Some items may interact with your medicine. What should I watch for while using this medicine? Visit your doctor or health care professional for regular checks on your progress. It may be several weeks or months before you feel the full benefits of this medicine. Continue to take your medicine on a regular schedule. Do not take any additional medicines for Parkinson's disease without first consulting with your health care provider. You may experience a wearing of effect prior to the time  for your next dose of this medicine. You may also experience an on-off effect where the medicine apparently stops working for anything from a minute to several hours, then suddenly starts working again. Tell your doctor or health care professional if any of these symptoms happen to you. Your dose may need to be changed. A high protein diet can slow or prevent absorption of this medicine. Avoid high protein foods near the time of taking this medicine to help to prevent these problems. Take this medicine at least 30 minutes before eating or one hour after meals. You may want to eat higher protein foods later in the day or in small amounts. Discuss your diet with your doctor or health care professional or nutritionist. You may get drowsy or dizzy. Do not drive, use machinery, or do anything that needs mental alertness until you know how this drug affects you. Do not stand or sit up quickly, especially if you are an older patient. This reduces the risk of dizzy or fainting spells. Alcohol can make you more drowsy and dizzy. Avoid alcoholic drinks. If you find that you have sudden feelings of wanting to sleep during normal activities, like cooking, watching television, or while driving or riding in a car, you should contact your health care professional. If you are diabetic, this medicine may interfere with the accuracy of some tests for sugar or ketones in the urine (does not interfere  with blood tests). Check with your doctor or health care professional before changing the dose of your diabetic medicine. This medicine may discolor the urine or sweat, making it look darker or red in color. This is of no cause for concern. However, this may stain clothing or fabrics. There have been reports of increased sexual urges or other strong urges such as gambling while taking some medicines for Parkinson's disease. If you experience any of these urges while taking this medicine, you should report it to your health care  provider as soon as possible. You should check your skin often for changes to moles and new growths while taking this medicine. Call your doctor if you notice any of these changes. This medicine may cause a decrease in vitamin B6. You should make sure that you get enough vitamin B6 while you are taking this medicine. Discuss the foods you eat and the vitamins you take with your health care professional. What side effects may I notice from receiving this medicine? Side effects that you should report to your doctor or health care professional as soon as possible: -allergic reactions like skin rash, itching or hives, swelling of the face, lips, or tongue -anxiety, confusion, or nervousness -falling asleep during normal activities like driving -fast, irregular heartbeat -hallucination, loss of contact with reality -mood changes like aggressive behavior, depression -stomach pain -trouble passing urine -uncontrolled movements of the mouth, head, hands, feet, shoulders, eyelids or other unusual muscle movements Side effects that usually do not require medical attention (report to your doctor or health care professional if they continue or are bothersome): -headache -loss of appetite -muscle twitches -nausea, vomiting -nightmares, trouble sleeping -unusually weak or tired This list may not describe all possible side effects. Call your doctor for medical advice about side effects. You may report side effects to FDA at 1-800-FDA-1088. Where should I keep my medicine? Keep out of the reach of children. Store at room temperature between 15 and 30 degrees C (59 and 86 degrees F). Protect from light. Throw away any unused medicine after the expiration date. NOTE: This sheet is a summary. It may not cover all possible information. If you have questions about this medicine, talk to your doctor, pharmacist, or health care provider.  2019 [employer]vier/Gold Standard (2017-02-25 08:21:48)

## 2018-08-11 NOTE — Progress Notes (Signed)
[facility]    Provider:  Dr Jaynee Eagles Referring Provider: Lawerance Cruel, MD Primary Care Physician:  Lawerance Cruel, MD  CC:  memory loss, +DAT Scan  Interval history 08/11/2018: Here with partner.  Patient presented initially to me with concerns of memory loss, exam showed parkinsonism.  DAT scan was consistent with a Parkinson spectrum disorder.  Discussed in detail Parkinson's syndromes, Lewy Body Dementia vs Parkinson's Disease.  Also discussed pathophysiology, possible etiologies, progression of disorder, modifying factors such as starting medication and exercise.  Also provided community resources and on line sites for information.  We will start Sinemet, his partner reports that he has slight confusion so on to stay away from the dopamine agonists and will start Sinemet at this time, also recommended Parkinson specific exercise classes, however Parkinson's another community resources.  We also discussed that there is a Parkinson's clinic in this area with an excellent specialist Wells Guiles Tat and they would like to see her.  HPI:  Ryan Woodward is a 73 y.o. male here as requested by Dr. Harrington Challenger for memory problems.  Past medical history of atrial fibrillation, insomnia, memory difficulties, bladder cancer.  He is a former smoker. He feels he has memory problems. He can't think of a world, will remember in a few seconds, been ongoing for 5-6 months. Unclear if it is getting worse. A few times a day. Sometimes he describes the object and he is frustrated and stops. He has a partner for 39 years. His partner doesn't think there is anything wrong. 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 Mother had alzheimers. Mother started having memory issues at the age of 69. Still working and has his own business. He has noticed he goes to the computer to pay bills and he has to stop and think about what to do at the computer. He loses things in the house ongoing for a few months.  Driving is fine, not gettig lost or accidents, he usually knows the day and date, he remembers important dates. Possibly after his bladder cancer chemo and surgery in 2013 his partner noticed he started having nightmares, always involves someone chasing him, then he wouldn't remember the dreams, act out the dreams and he talks gibberish in his sleep he has found his bed sheets on the floor, once he got up and was going to leave in his underwear and his partner stopped him. He notices things on the night stand on the floor. No falls or imbalance. No tremors. Voice is softer. No changes in handwriting. He has constipation and loss of smell.   Reviewed notes, labs and imaging from outside physicians, which showed:  Reviewed notes from referring physician Dr. Harrington Challenger.  Patient feels his memory has been progressively worsening especially in the months prior to last visit which was July 2019.  Memory is gotten worse, dropping words, trouble finishing sentences.  Mother and maternal uncle had Alzheimer's as well as other relatives.  He feels as though he flails in bed, trazodone helps some, has grogginess in the morning.  B12 and thyroid were ordered at last appointment, also CMP was ordered, he takes trazodone at night for insomnia.  TSH 2.9, CMP unremarkable with BUN 13 and creatinine 0.91, vitamin B12 quantity was not sufficient for analysis  Review of Systems: Patient complains of symptoms per HPI as well as the following symptoms: dizziness, memory loss, afib, constipation. Pertinent negatives and positives per HPI. All others negative.   Social History  Socioeconomic History  . Marital status: Soil scientist    Spouse name: Not on file  . Number of children: 0  . Years of education: Not on file  . Highest education level: Master's degree (e.g., MA, MS, MEng, MEd, MSW, MBA)  Occupational History  . Not on file  Social Needs  . Financial resource strain: Not on file  . Food insecurity:     Worry: Not on file    Inability: Not on file  . Transportation needs:    Medical: Not on file    Non-medical: Not on file  Tobacco Use  . Smoking status: Former Smoker    Last attempt to quit: 09/1985    Years since quitting: 32.9  . Smokeless tobacco: Never Used  Substance and Sexual Activity  . Alcohol use: Yes    Alcohol/week: 14.0 standard drinks    Types: 14 Cans of beer per week    Comment: 2 beers daily  . Drug use: Never  . Sexual activity: Not on file  Lifestyle  . Physical activity:    Days per week: Not on file    Minutes per session: Not on file  . Stress: Not on file  Relationships  . Social connections:    Talks on phone: Not on file    Gets together: Not on file    Attends religious service: Not on file    Active member of club or organization: Not on file    Attends meetings of clubs or organizations: Not on file    Relationship status: Not on file  . Intimate partner violence:    Fear of current or ex partner: Not on file    Emotionally abused: Not on file    Physically abused: Not on file    Forced sexual activity: Not on file  Other Topics Concern  . Not on file  Social History Narrative   Lives at home with partner   Right handed   Caffeine: 2 cups of coffee each morning     Family History  Problem Relation Age of Onset  . Alzheimer's disease Mother   . Stroke Mother   . Alzheimer's disease Maternal Uncle     Past Medical History:  Diagnosis Date  . A-fib ([place])   . Allergies   . Back pain   . Bladder cancer ([place]) dx aug'12   s/p turbt  . Enlarged prostate with urinary obstruction    nocturia/ freq/ dysuria  . PD (Parkinson's disease) ([place])   . Skin cancer of nose 05/2018   removal planned for Nov 2019    Past Surgical History:  Procedure Laterality Date  . CERVICAL FUSION  2000   C4-5  . COLONOSCOPY    . CYSTOSCOPY  06/16/2011   Procedure: CYSTOSCOPY;  Surgeon: Molli Hazard, MD;  Location: [facility];   Service: Urology;  Laterality: N/A;  . CYSTOSCOPY WITH URETHRAL DILATATION  w/ TURBT BX   05-04-11  . SINUS SURGERY    . TRANSURETHRAL RESECTION OF BLADDER TUMOR  AUG  '12  . TRANSURETHRAL RESECTION OF PROSTATE  06/16/2011   Procedure: TRANSURETHRAL RESECTION OF THE PROSTATE (TURP);  Surgeon: Molli Hazard, MD;  Location: [facility];  Service: Urology;  Laterality: N/A;  URETHRAL DILATATION, MEATOTOMY, PENIL BLOCK    Current Outpatient Medications  Medication Sig Dispense Refill  . aspirin EC 81 MG tablet Take 81 mg by mouth daily.    . fluticasone (FLONASE) 50 MCG/ACT nasal spray  Place 2 sprays into the nose every morning.      . metoprolol succinate (TOPROL-XL) 25 MG 24 hr tablet Take 0.5 tablets (12.5 mg total) by mouth daily. 30 tablet 1  . Multiple Vitamins-Minerals (MULTIVITAMIN PO) Take by mouth.    . pantoprazole (PROTONIX) 40 MG tablet Take 40 mg by mouth daily as needed.    . Polyethylene Glycol 3350 (MIRALAX PO) Take by mouth daily.     . sildenafil (REVATIO) 20 MG tablet Take 20 mg by mouth 3 (three) times daily as needed.    . traZODone (DESYREL) 50 MG tablet Take 25-50 mg by mouth at bedtime as needed for sleep.     Marland Kitchen zolpidem (AMBIEN) 10 MG tablet Take 10 mg by mouth at bedtime as needed for sleep.    . carbidopa-levodopa (SINEMET IR) 25-100 MG tablet Take 1/2 to a whole  tablet 3x a day spaced by 4 hours apart. Separate from protein. 90 tablet 6  . clonazePAM (KLONOPIN) 0.5 MG tablet Take 1/2 to 1 pill at bedtime for sleep disorder 30 tablet 4   No current facility-administered medications for this visit.     Allergies as of 08/11/2018  . (No Known Allergies)    Vitals: BP (!) 143/76   Pulse (!) 55   Ht 6\' 4"  (1.93 m)   Wt 184 lb (83.5 kg)   BMI 22.40 kg/m  Last Weight:  Wt Readings from Last 1 Encounters:  08/11/18 184 lb (83.5 kg)   Last Height:   Ht Readings from Last 1 Encounters:  08/11/18 6\' 4"  (1.93 m)   Physical  exam: Exam: Gen: NAD, conversant, well nourised, thin, well groomed                     CV: RRR, no MRG. No Carotid Bruits. No peripheral edema, warm, nontender Eyes: Conjunctivae clear without exudates or hemorrhage  Neuro: Detailed Neurologic Exam  Speech:    Speech is normal; fluent and spontaneous with normal comprehension.  Cognition:  MMSE - Mini Mental State Exam 05/23/2018  Orientation to time 5  Orientation to Place 5  Registration 3  Attention/ Calculation 5  Recall 3  Language- name 2 objects 2  Language- repeat 1  Language- follow 3 step command 3  Language- read & follow direction 1  Write a sentence 1  Copy design 1  Total score 30       The patient is oriented to person, place, and time;     recent and remote memory intact;     language fluent;     normal attention, concentration,     fund of knowledge Cranial Nerves: mild hypomimia    The pupils are round, anisocoria right 44mm larger but appear to be equally reactive to light. Attempted fundoscopic exam could not visualize.  Visual fields are full to finger confrontation. Mildly impaired upgaze otherwise extraocular movements are intact. Trigeminal sensation is intact and the muscles of mastication are normal. The face is symmetric. The palate elevates in the midline. Hearing intact. Voice is hypophonic. Shoulder shrug is normal. The tongue has normal motion without fasciculations.   Coordination:    Normal finger to nose and heel to shin. Normal finger taps  Gait:    Heel-toe normal, and tandem gait with minimal imbalance. Decreased arm swing on the right.  Motor Observation:    No asymmetry, no atrophy, and no involuntary movements noted. Tone:    Normal muscle tone.  No cogwheeling.  Posture:    Posture is normal. normal erect    Strength:    Strength is V/V in the upper and lower limbs.      Sensation: intact to LT     Reflex Exam:  DTR's: absent AJs. 2+ right patella with crossed reflex,  trace left patellar, 1+ biceps   Toes: left down, right equiv     Clonus:    Clonus is absent. Neg hoffman.        Assessment/Plan:  [age] year old with 1st degree FHx of Dementia in mother (Alzheimers?). He is exhibiting aphasia and memory loss. Also parkinsonian signs such as decreased arm swing, hypomimia, hypophonia, possible REM Sleep disorder or PLMS, loss of smell and constipation.   -DaTscan consistent with Parkinson syndrome.  Had a long discussion about what this means, Lewy body dementia versus Parkinson's disease versus other disorders.  I suspect patient has Parkinson's disease. - skin cancer association, recommended regular skin -Very lovely patient and his partner would like to be recommended to Parkinson's specialty clinic, I discussed that we have an absolutely wonderful Parkinson's expert in this area called Wells Guiles Tat -We will refer to social work in [place] Tat's office -Provided information on community resouces - stressed exercise as a way to slow down progression - consider clonazepam for REM sleep disorder as a way to slow down the shin,  - Sleep eval scheduled next week - Formal neurocognitive testing ordered - Sinemet. Will stay aware from the [place] because of reports from partner of more confusion in patient - Has seen  Dr. Brett Fairy for sleep eval of PLMS vs REM Sleep Disorder or other parasomnia: In 2013 his partner noticed he started having nightmares, always involves someone chasing him, he would act out the dreams and he talks gibberish in his sleep - also he has found his bed sheets on the floor, once he got up and was going to leave in his underwear and his partner stopped him. He notices things on the night stand on the floor. He thrashes in bed. Not snoring.   Orders Placed This Encounter  Procedures  . Ambulatory referral to Social Work  . Ambulatory referral to Neurology  . Ambulatory referral to Neuropsychology   Meds ordered this encounter   Medications  . clonazePAM (KLONOPIN) 0.5 MG tablet    Sig: Take 1/2 to 1 pill at bedtime for sleep disorder    Dispense:  30 tablet    Refill:  4  . carbidopa-levodopa (SINEMET IR) 25-100 MG tablet    Sig: Take 1/2 to a whole  tablet 3x a day spaced by 4 hours apart. Separate from protein.    Dispense:  90 tablet    Refill:  6    Cc: Dr. Lovena Le, MD  [facility] [address] [place], [place] [address]  Phone [phone] Fax [phone]  A total of 80  minutes was spent face-to-face with this patient. Over half this time was spent on counseling patient on the  1. PD (Parkinson's disease) ([place])   2. Parkinsonism, unspecified Parkinsonism type ([place])   3. Dementia due to Parkinson's disease without behavioral disturbance ([place])     diagnosis and different diagnostic and therapeutic options, counseling and coordination of care, risks ans benefits of management, compliance, or risk factor reduction and education.

## 2018-08-14 ENCOUNTER — Encounter: Payer: Self-pay | Admitting: Neurology

## 2018-08-15 ENCOUNTER — Encounter (INDEPENDENT_AMBULATORY_CARE_PROVIDER_SITE_OTHER): Payer: Medicare Other | Admitting: Neurology

## 2018-08-15 DIAGNOSIS — G4752 REM sleep behavior disorder: Secondary | ICD-10-CM | POA: Diagnosis not present

## 2018-08-15 DIAGNOSIS — M6289 Other specified disorders of muscle: Secondary | ICD-10-CM

## 2018-08-15 DIAGNOSIS — F513 Sleepwalking [somnambulism]: Secondary | ICD-10-CM

## 2018-08-15 DIAGNOSIS — R430 Anosmia: Secondary | ICD-10-CM

## 2018-08-17 ENCOUNTER — Ambulatory Visit (INDEPENDENT_AMBULATORY_CARE_PROVIDER_SITE_OTHER): Payer: Medicare Other | Admitting: Neurology

## 2018-08-17 ENCOUNTER — Encounter

## 2018-08-17 DIAGNOSIS — G20 Parkinson's disease: Secondary | ICD-10-CM

## 2018-08-17 DIAGNOSIS — G4752 REM sleep behavior disorder: Secondary | ICD-10-CM

## 2018-08-17 DIAGNOSIS — R413 Other amnesia: Secondary | ICD-10-CM

## 2018-08-23 NOTE — Progress Notes (Signed)
Ryan Woodward was seen today in the movement disorders clinic for neurologic consultation at the request of Melvenia Beam, MD.  The consultation is for the evaluation of parkinsonism.  I have reviewed prior medical records made available to me, including those of Dr. Jaynee Eagles.  Patient first saw Dr. Jaynee Eagles on May 23, 2018.  The visit was for memory change.  MMSE was 30/30 at that visit.  On examination, Dr. Jaynee Eagles was concerned for possible Lewy body dementia given parkinsonian symptoms and his concern for memory loss.  In addition, she noted some aphasia, although examination does not provide details about this.  Pt had a DaT scan on 07/20/18 and this demonstrated loss of DA in the L and R putamen.  He was started on carbidopa/levodopa 25/100, 1/2 po tid on 08/11/18.  Pt states that he has been "experimenting" with this and he is currently on 0.5/0.5/1.  He doesn't know if it is helpful.  He didn't take it today.  He was also started on klonopin 0.5 mg q hs for RBD.  It has helped the kicking/jerking at night.      Specific Symptoms:  Tremor: very little, both hands equally.  Noted most "after using the hands."  No internal tremor Family hx of similar:  No. Voice: softer and weaker Sleep: sleeps well with klonopin (prior waking to use bathroom)  Vivid Dreams:  No.  Acting out dreams:  Yes.  , better with klonopin Wet Pillows: No. Postural symptoms:  Yes.    - " I have to be more mindful"  Falls?  No. Bradykinesia symptoms: shuffling gait and difficulty getting out of a chair Loss of smell:  Yes.   Loss of taste:  No. Urinary Incontinence:  No. Difficulty Swallowing:  Yes.  , but not often Handwriting, micrographia: No. Trouble with ADL's:  No.  Trouble buttoning clothing: No. Depression:  No. Memory changes:  Yes.  , word finding trouble.  Manages monthly bills but has recently missed payments.  With medication, has a check/balance system with partner Hallucinations:  No.  visual  distortions: Yes.   N/V:  No. Lightheaded:  No.  Syncope: No. Diplopia:  No. Dyskinesia:  No. Prior exposure to reglan/antipsychotics: No.  Neuroimaging of the brain has  previously been performed.  It is available for my review today.  MRI brain was completed on June 15, 2018.  This was unremarkable.  PREVIOUS MEDICATIONS: Sinemet  ALLERGIES:  No Known Allergies  CURRENT MEDICATIONS:  Outpatient Encounter Medications as of 08/25/2018  Medication Sig  . aspirin EC 81 MG tablet Take 81 mg by mouth daily.  . carbidopa-levodopa (SINEMET IR) 25-100 MG tablet Take 1 tablet by mouth 3 (three) times daily.  . clonazePAM (KLONOPIN) 0.5 MG tablet Take 1/2 to 1 pill at bedtime for sleep disorder (Patient taking differently: Take 0.5 mg by mouth at bedtime. Take 1/2 to 1 pill at bedtime for sleep disorder)  . fluticasone (FLONASE) 50 MCG/ACT nasal spray Place 2 sprays into the nose every morning.    . metoprolol succinate (TOPROL-XL) 25 MG 24 hr tablet Take 0.5 tablets (12.5 mg total) by mouth daily.  . Multiple Vitamins-Minerals (MULTIVITAMIN PO) Take by mouth.  . pantoprazole (PROTONIX) 40 MG tablet Take 40 mg by mouth daily as needed.  . Polyethylene Glycol 3350 (MIRALAX PO) Take by mouth daily.   . sildenafil (REVATIO) 20 MG tablet Take 20 mg by mouth 3 (three) times daily as needed.  . [DISCONTINUED] carbidopa-levodopa (  SINEMET IR) 25-100 MG tablet Take 1/2 to a whole  tablet 3x a day spaced by 4 hours apart. Separate from protein.  . [DISCONTINUED] traZODone (DESYREL) 50 MG tablet Take 25-50 mg by mouth at bedtime as needed for sleep.   . [DISCONTINUED] zolpidem (AMBIEN) 10 MG tablet Take 10 mg by mouth at bedtime as needed for sleep.   No facility-administered encounter medications on file as of 08/25/2018.     PAST MEDICAL HISTORY:   Past Medical History:  Diagnosis Date  . A-fib ([place])   . Allergies   . Back pain   . Bladder cancer ([place]) dx aug'12   s/p turbt  . Enlarged  prostate with urinary obstruction    nocturia/ freq/ dysuria  . PD (Parkinson's disease) ([place])   . Skin cancer of nose 05/2018   removal planned for Nov 2019    PAST SURGICAL HISTORY:   Past Surgical History:  Procedure Laterality Date  . CERVICAL FUSION  2000   C4-5  . COLONOSCOPY    . CYSTOSCOPY  06/16/2011   Procedure: CYSTOSCOPY;  Surgeon: Molli Hazard, MD;  Location: [facility];  Service: Urology;  Laterality: N/A;  . CYSTOSCOPY WITH URETHRAL DILATATION  w/ TURBT BX   05-04-11  . SINUS SURGERY    . TRANSURETHRAL RESECTION OF BLADDER TUMOR  AUG  '12  . TRANSURETHRAL RESECTION OF PROSTATE  06/16/2011   Procedure: TRANSURETHRAL RESECTION OF THE PROSTATE (TURP);  Surgeon: Molli Hazard, MD;  [facility];  Service: Urology;  Laterality: N/A;  URETHRAL DILATATION, MEATOTOMY, PENIL BLOCK    SOCIAL HISTORY:   Social History   Socioeconomic History  . Marital status: Soil scientist    Spouse name: Not on file  . Number of children: 0  . Years of education: Not on file  . Highest education level: Master's degree (e.g., MA, MS, MEng, MEd, MSW, MBA)  Occupational History  . Not on file  Social Needs  . Financial resource strain: Not on file  . Food insecurity:    Worry: Not on file    Inability: Not on file  . Transportation needs:    Medical: Not on file    Non-medical: Not on file  Tobacco Use  . Smoking status: Former Smoker    Last attempt to quit: 09/1985    Years since quitting: 33.0  . Smokeless tobacco: Never Used  Substance and Sexual Activity  . Alcohol use: Yes    Alcohol/week: 14.0 standard drinks    Types: 14 Cans of beer per week    Comment: 2 beers daily  . Drug use: Never  . Sexual activity: Not on file  Lifestyle  . Physical activity:    Days per week: Not on file    Minutes per session: Not on file  . Stress: Not on file  Relationships  . Social connections:    Talks on phone: Not on  file    Gets together: Not on file    Attends religious service: Not on file    Active member of club or organization: Not on file    Attends meetings of clubs or organizations: Not on file    Relationship status: Not on file  . Intimate partner violence:    Fear of current or ex partner: Not on file    Emotionally abused: Not on file    Physically abused: Not on file    Forced sexual activity: Not on file  Other  Topics Concern  . Not on file  Social History Narrative   Lives at home with partner   Right handed   Caffeine: 2 cups of coffee each morning     FAMILY HISTORY:   Family Status  Relation Name Status  . Mother  Deceased at age 33  . Father  Deceased  . Mat Uncle  (Not Specified)  . Sister  Deceased  . Brother  Alive  . Sister  Alive  . Neg Hx  (Not Specified)    ROS:  Review of Systems  Constitutional: Negative.   HENT: Negative.   Eyes: Positive for blurred vision.  Cardiovascular: Negative.   Gastrointestinal: Positive for constipation.  Genitourinary: Negative.   Musculoskeletal: Negative.   Skin: Negative.   Endo/Heme/Allergies: Negative.     PHYSICAL EXAMINATION:    VITALS:   Vitals:   08/25/18 0924  BP: (!) 144/80  Pulse: 60  SpO2: 98%  Weight: 186 lb (84.4 kg)  Height: 6\' 4"  (1.93 m)    GEN:  The patient appears stated age and is in NAD. HEENT:  Normocephalic, atraumatic.  The mucous membranes are moist. The superficial temporal arteries are without ropiness or tenderness. CV:  RRR Lungs:  CTAB Neck/HEME:  There are no carotid bruits bilaterally.  Neurological examination:  Orientation: The patient is alert and oriented x3. Fund of knowledge is appropriate.  Recent and remote memory are intact.  Attention and concentration are normal.    Able to name objects and repeat phrases. Cranial nerves: There is good facial symmetry.  There is mild facial hypomania.  Lips are occasionally parted.  Pupils are equal round and reactive to light  bilaterally. Fundoscopic exam reveals clear margins bilaterally. Extraocular muscles are intact. The visual fields are full to confrontational testing. The speech is fluent and clear.  He is hypophonic.  Soft palate rises symmetrically and there is no tongue deviation. Hearing is intact to conversational tone. Sensation: Sensation is intact to light and pinprick throughout (facial, trunk, extremities). Vibration is absent at the bilateral big toe but intact at the bilateral big toe. There is no extinction with double simultaneous stimulation. There is no sensory dermatomal level identified. Motor: Strength is 5/5 in the bilateral upper and lower extremities.   Shoulder shrug is equal and symmetric.  There is no pronator drift. Deep tendon reflexes: Deep tendon reflexes are 2/4 at the bilateral biceps, triceps, brachioradialis, 2- at the bilateral patella and 1/4 at the bilateral achilles. Plantar responses are downgoing bilaterally.  Movement examination: Tone: There is slightly increased tone in the LUE, only with activation procedures.  Tone elsewhere is normal. Abnormal movements: mild postural tremor bilaterally Coordination:  There is no decremation with RAM's, with any form of RAMS, including alternating supination and pronation of the forearm, hand opening and closing, finger taps, heel taps and toe taps bilaterally Gait and Station: The patient has no difficulty arising out of a deep-seated chair without the use of the hands. The patient's stride length is normal.  The patient has a negative pull test.      ASSESSMENT/PLAN:  1.  Mild parkinsonism  -Discussed with the patient that he does not meet [place] brain bank criteria for the diagnosis of Parkinson's disease.  He also does not meet criteria for any of the atypical states.  Told him that this could be because he is on levodopa today, but he is on a small dose (took a half a tablet today).  He has had a positive  DaTscan.  He does have REM  behavior disorder.  My suspicion is that he is in the premotor phase.  Discussed that DaTscan is not a diagnostic scan and that patients can lose dopamine many years (up to 8) prior to meeting clinical criteria.  However, to be sure, I would need to examine him off of levodopa.  -I do agree with the fact that the patient needs neurocognitive testing.  I would recommend [facility].  -Discussed with the patient the data on safe, cardiovascular exercise.  This is certainly some that I would recommend for him.  -offered ST referral given hypophonia.  Will let me know if wants referral  2.  REM behavior disorder  -Patient is on clonazepam.  This has definitely helped.  He will continue that.  3.  Offered patient to follow here, or to follow back up with Dr. Jaynee Eagles.  He ultimately decided to follow back up with Dr. Jaynee Eagles.  I will see him back on an as-needed basis.  Much greater than 50% of this visit was spent in counseling and coordinating care.  Total face to face time:  45 min  Cc:  Lawerance Cruel, MD

## 2018-08-25 ENCOUNTER — Ambulatory Visit: Payer: Medicare Other | Admitting: Neurology

## 2018-08-25 ENCOUNTER — Encounter: Payer: Self-pay | Admitting: Neurology

## 2018-08-25 VITALS — BP 144/80 | HR 60 | Ht 76.0 in | Wt 186.0 lb

## 2018-08-25 DIAGNOSIS — G4752 REM sleep behavior disorder: Secondary | ICD-10-CM | POA: Diagnosis not present

## 2018-08-25 DIAGNOSIS — G20 Parkinson's disease: Secondary | ICD-10-CM | POA: Diagnosis not present

## 2018-08-25 NOTE — Patient Instructions (Signed)
1.  Let us know if you would like a referral for the speech therapy for the voice 2.  If you decide to follow up here, I would like to see you OFF of carbidopa/levodopa 25/100 next visit, so that I can be sure that it just wasn't the medication covering up your symptoms 3.  I would recommend [facility] for the memory testing. 4.  Good to see you today!

## 2018-08-27 ENCOUNTER — Telehealth: Payer: Self-pay | Admitting: Neurology

## 2018-08-27 ENCOUNTER — Other Ambulatory Visit: Payer: Self-pay | Admitting: Neurology

## 2018-08-27 NOTE — Procedures (Signed)
PATIENT'S NAME:  Nollan, Muldrow DOB:      28-Aug-1945      MR#:    [id number]     DATE OF RECORDING: 08/15/2018  AL REFERRING M.D.:  Sarina Ill, MD Study Performed:   Baseline Polysomnogram with parasomnia montage  HISTORY:  NAHOM CARFAGNO is a 73 y.o. male patient seen here 06-26-2018 in a referral from Dr Jaynee Eagles via PCP Dr. Harrington Challenger for a sleep evaluation. Original referral was requested by Dr. Harrington Challenger for memory problems.  Past medical history of atrial fibrillation, insomnia, memory difficulties, bladder cancer.  He is a former smoker. He feels he has memory problems.    Chief complaint according to patient : " I wake up fighting, flailing, kicking, sometimes talking gibberish". My partner caught me two nights ago leaving the bed. The most significant episode was in a [place]- hotel on vacation. He was just in time hindered to leave the room in his underwear. He remains amnestic of these spells, and doesn't remember.  He is usually someone who easily goes to sleep, but over the last 3-5 years has had nightmares more and more frequently- and has problems sleeping through the night. The bedroom is cool, quiet but not dark (a low volume TV runs in the background) and Mr. Russomanno has no trouble initiating sleep at first. He sleeps on his sides and uses a thick pillow for head and neck support.  No RLS. He wakes up within 2 hours for a bathroom break. His partner noted his activity - acing out dreams- within the first 2 hours of sleep. He has never fallen out of bed. He has pushed things off the night stand. This happened with increasing frequency. By 2-3 AM he takes a trazodone to get more sleep. This on 3 nights of the week. DAT scan pending.  MRI brain was normal.  The patient endorsed the Epworth Sleepiness Scale at 11 points.   The patient's weight 185 pounds with a height of 76 (inches), resulting in a BMI of 22.6 kg/m2. The patient's neck circumference measured 14.5 inches.  CURRENT MEDICATIONS: Aspirin,  Flonase, Toprol, Protonix, Miralax, Revatio, Desyrel   PROCEDURE:  This is a multichannel digital polysomnogram utilizing the Somnostar 11.2 system.  Electrodes and sensors were applied and monitored per AASM Specifications.   EEG, EOG, Chin and Limb EMG, were sampled at 200 Hz.  ECG, Snore and Nasal Pressure, Thermal Airflow, Respiratory Effort, CPAP Flow and Pressure, Oximetry was sampled at 50 Hz. Digital video and audio were recorded.      BASELINE STUDY: Lights Out was at 22:57 and Lights On at 04:59.  Total recording time (TRT) was 362.5 minutes, with a total sleep time (TST) of 304 minutes.   The patient's sleep latency was 20 minutes.  REM latency was 117.5 minutes.  The sleep efficiency was 83.9 %.     SLEEP ARCHITECTURE: WASO (Wake after sleep onset) was 25.5 minutes.  There were 55.5 minutes in Stage N1, 132.5 minutes Stage N2, 93.5 minutes Stage N3 and 22.5 minutes in Stage REM.  The percentage of Stage N1 was 18.3%, Stage N2 was 43.6%, Stage N3 was 30.8% and Stage R (REM sleep) was 7.4%.    RESPIRATORY ANALYSIS:  There were a total of 52 respiratory events:  19 obstructive apneas, 4 central apneas and 25 mixed apneas with 4 hypopneas. The patient also had 10 respiratory event related arousals (RERAs).     The total APNEA/HYPOPNEA INDEX (AHI) was 10.3 /hour and the total  RESPIRATORY DISTURBANCE INDEX was 12.2 /hour.  2 events occurred in REM sleep and 37 events in NREM. The REM AHI was 5.3 /hour, versus a non-REM AHI of 10.7. The patient spent 128.5 minutes of total sleep time in the supine position and 176 minutes in non-supine. The supine AHI was 23.4/h versus a non-supine AHI of 0.7.  OXYGEN SATURATION & C02:  The Wake baseline 02 saturation was 96%, with the lowest being 88%. Time spent below 89% saturation equaled 1 minute.   AROUSALS: The arousals were noted as: 56 were spontaneous, 0 were associated with PLMs, and 38 were associated with respiratory events. Audio and video analysis  did not show any abnormal or unusual movements, behaviors, phonations or vocalizations.  No nocturia. The patient had a total of 0 Periodic Limb Movements. Bruxism was noted. Muscle tone was elevated during REM sleep.   EKG was irregular, with intermittent variable R to R intervals. PACs; See screen shot.  Post-study, the patient indicated that sleep was the same as usual, he felt groggy.   IMPRESSION:  1. Mostly Obstructive, but CPOMPLEX Sleep Apnea -AHI of 10.3/h. additional snoring related arousals.  2. Sleep Related Bruxism, noted in REM sleep. This is likely reflecting sleep walking parasomnia.   3. Dysfunctions associated with sleep stages or arousal from sleep 4. Non-specific abnormal EKG, bradycardia and PACs.   RECOMMENDATIONS:  1. Advise full-night, attended, CPAP titration study to optimize therapy. Treatment of apnea may help to achieve better, sounder sleep. 2. We were not able to capture a parasomnia, but noted that muscle tone remained higher than normal during REM sleep. I will ask his primary neurologist to follow with regular MOCA and muscle tone testing.    I certify that I have reviewed the entire raw data recording prior to the issuance of this report in accordance with the Standards of Accreditation of the American Academy of Sleep Medicine (AASM)  Larey Seat, MD   08-27-2018 Diplomat, American Board of Psychiatry and Neurology  Diplomat, American Board of [place] Director, [facility]

## 2018-08-27 NOTE — Telephone Encounter (Signed)
Study appeared as consultation note in COMPUTER- not procedure note. Can't close note _   Please help.    PATIENT'S NAME:  Ryan Woodward, Ryan Woodward DOB:      [birth year]      MR#:    [id number]     DATE OF RECORDING: 08/15/2018  AL REFERRING M.D.:  Sarina Ill, MD Study Performed:   Baseline Polysomnogram with parasomnia montage  HISTORY:  Ryan Woodward is a 73 y.o. male patient seen here 06-26-2018 in a referral from Dr Ryan Woodward via PCP Dr. Harrington Woodward for a sleep evaluation. Original referral was requested by Dr. Harrington Woodward for memory problems.  Past medical history of atrial fibrillation, insomnia, memory difficulties, bladder cancer.  He is a former smoker. He feels he has memory problems.    Chief complaint according to patient : " I wake up fighting, flailing, kicking, sometimes talking gibberish". My partner caught me two nights ago leaving the bed. The most significant episode was in a [place]- hotel on vacation. He was just in time hindered to leave the room in his underwear. He remains amnestic of these spells, and doesn't remember.  He is usually someone who easily goes to sleep, but over the last 3-5 years has had nightmares more and more frequently- and has problems sleeping through the night. The bedroom is cool, quiet but not dark (a low volume TV runs in the background) and Ryan Woodward has no trouble initiating sleep at first. He sleeps on his sides and uses a thick pillow for head and neck support.  No RLS. He wakes up within 2 hours for a bathroom break. His partner noted his activity - acing out dreams- within the first 2 hours of sleep. He has never fallen out of bed. He has pushed things off the night stand. This happened with increasing frequency. By 2-3 AM he takes a trazodone to get more sleep. This on 3 nights of the week. DAT scan pending.  MRI brain was normal.  The patient endorsed the Epworth Sleepiness Scale at 11 points.   The patient's weight 185 pounds with a height of 76 (inches), resulting in a  BMI of 22.6 kg/m2. The patient's neck circumference measured 14.5 inches.  CURRENT MEDICATIONS: Aspirin, Flonase, Toprol, Protonix, Miralax, Revatio, Desyrel   PROCEDURE:  This is a multichannel digital polysomnogram utilizing the Somnostar 11.2 system.  Electrodes and sensors were applied and monitored per AASM Specifications.   EEG, EOG, Chin and Limb EMG, were sampled at 200 Hz.  ECG, Snore and Nasal Pressure, Thermal Airflow, Respiratory Effort, CPAP Flow and Pressure, Oximetry was sampled at 50 Hz. Digital video and audio were recorded.      BASELINE STUDY: Lights Out was at 22:57 and Lights On at 04:59.  Total recording time (TRT) was 362.5 minutes, with a total sleep time (TST) of 304 minutes.   The patient's sleep latency was 20 minutes.  REM latency was 117.5 minutes.  The sleep efficiency was 83.9 %.     SLEEP ARCHITECTURE: WASO (Wake after sleep onset) was 25.5 minutes.  There were 55.5 minutes in Stage N1, 132.5 minutes Stage N2, 93.5 minutes Stage N3 and 22.5 minutes in Stage REM.  The percentage of Stage N1 was 18.3%, Stage N2 was 43.6%, Stage N3 was 30.8% and Stage R (REM sleep) was 7.4%.    RESPIRATORY ANALYSIS:  There were a total of 52 respiratory events:  19 obstructive apneas, 4 central apneas and 25 mixed apneas with 4 hypopneas. The patient also had  10 respiratory event related arousals (RERAs).     The total APNEA/HYPOPNEA INDEX (AHI) was 10.3 /hour and the total RESPIRATORY DISTURBANCE INDEX was 12.2 /hour.  2 events occurred in REM sleep and 37 events in NREM. The REM AHI was 5.3 /hour, versus a non-REM AHI of 10.7. The patient spent 128.5 minutes of total sleep time in the supine position and 176 minutes in non-supine. The supine AHI was 23.4/h versus a non-supine AHI of 0.7.  OXYGEN SATURATION & C02:  The Wake baseline 02 saturation was 96%, with the lowest being 88%. Time spent below 89% saturation equaled 1 minute.   AROUSALS: The arousals were noted as: 56 were  spontaneous, 0 were associated with PLMs, and 38 were associated with respiratory events. Audio and video analysis did not show any abnormal or unusual movements, behaviors, phonations or vocalizations.  No nocturia. The patient had a total of 0 Periodic Limb Movements. Bruxism was noted. Muscle tone was elevated during REM sleep.   EKG was irregular, with intermittent variable R to R intervals. PACs; See screen shot.  Post-study, the patient indicated that sleep was the same as usual, he felt groggy.   IMPRESSION:  1. Mostly Obstructive, but CPOMPLEX Sleep Apnea -AHI of 10.3/h. additional snoring related arousals.  2. Sleep Related Bruxism, noted in REM sleep. This is likely reflecting sleep walking parasomnia.   3. Dysfunctions associated with sleep stages or arousal from sleep 4. Non-specific abnormal EKG, bradycardia and PACs.   RECOMMENDATIONS:  1. Advise full-night, attended, CPAP titration study to optimize therapy. Treatment of apnea may help to achieve better, sounder sleep. 2. We were not able to capture a parasomnia, but noted that muscle tone remained higher than normal during REM sleep. I will ask his primary neurologist to follow with regular MOCA and muscle tone testing.    I certify that I have reviewed the entire raw data recording prior to the issuance of this report in accordance with the Standards of Accreditation of the American Academy of Sleep Medicine (AASM)  Larey Seat, MD   08-27-2018 Diplomat, American Board of Psychiatry and Neurology  Diplomat, American Board of [place] Director, [facility]

## 2018-08-27 NOTE — Progress Notes (Signed)
PATIENT'S NAME:  Ryan Woodward, Ryan Woodward DOB:      02/24/1946      MR#:    [id number]     DATE OF RECORDING: 08/15/2018  AL REFERRING M.D.:  Sarina Ill, MD Study Performed:   Baseline Polysomnogram with parasomnia montage  HISTORY:  Ryan Woodward is a 73 y.o. male patient seen here 06-26-2018 in a referral from Dr Jaynee Eagles via PCP Dr. Harrington Challenger for a sleep evaluation. Original referral was requested by Dr. Harrington Challenger for memory problems.  Past medical history of atrial fibrillation, insomnia, memory difficulties, bladder cancer.  He is a former smoker. He feels he has memory problems.    Chief complaint according to patient : " I wake up fighting, flailing, kicking, sometimes talking gibberish". My partner caught me two nights ago leaving the bed. The most significant episode was in a [place]- hotel on vacation. He was just in time hindered to leave the room in his underwear. He remains amnestic of these spells, and doesn't remember.  He is usually someone who easily goes to sleep, but over the last 3-5 years has had nightmares more and more frequently- and has problems sleeping through the night. The bedroom is cool, quiet but not dark (a low volume TV runs in the background) and [name] has no trouble initiating sleep at first. He sleeps on his sides and uses a thick pillow for head and neck support.  No RLS. He wakes up within 2 hours for a bathroom break. His partner noted his activity - acing out dreams- within the first 2 hours of sleep. He has never fallen out of bed. He has pushed things off the night stand. This happened with increasing frequency. By 2-3 AM he takes a trazodone to get more sleep. This on 3 nights of the week. DAT scan pending.  MRI brain was normal.  The patient endorsed the Epworth Sleepiness Scale at 11 points.   The patient's weight 185 pounds with a height of 76 (inches), resulting in a BMI of 22.6 kg/m2. The patient's neck circumference measured 14.5 inches.  CURRENT MEDICATIONS: Aspirin,  Flonase, Toprol, Protonix, Miralax, Revatio, Desyrel   PROCEDURE:  This is a multichannel digital polysomnogram utilizing the Somnostar 11.2 system.  Electrodes and sensors were applied and monitored per AASM Specifications.   EEG, EOG, Chin and Limb EMG, were sampled at 200 Hz.  ECG, Snore and Nasal Pressure, Thermal Airflow, Respiratory Effort, CPAP Flow and Pressure, Oximetry was sampled at 50 Hz. Digital video and audio were recorded.      BASELINE STUDY: Lights Out was at 22:57 and Lights On at 04:59.  Total recording time (TRT) was 362.5 minutes, with a total sleep time (TST) of 304 minutes.   The patient's sleep latency was 20 minutes.  REM latency was 117.5 minutes.  The sleep efficiency was 83.9 %.     SLEEP ARCHITECTURE: WASO (Wake after sleep onset) was 25.5 minutes.  There were 55.5 minutes in Stage N1, 132.5 minutes Stage N2, 93.5 minutes Stage N3 and 22.5 minutes in Stage REM.  The percentage of Stage N1 was 18.3%, Stage N2 was 43.6%, Stage N3 was 30.8% and Stage R (REM sleep) was 7.4%.    RESPIRATORY ANALYSIS:  There were a total of 52 respiratory events:  19 obstructive apneas, 4 central apneas and 25 mixed apneas with 4 hypopneas. The patient also had 10 respiratory event related arousals (RERAs).     The total APNEA/HYPOPNEA INDEX (AHI) was 10.3 /hour and the total  RESPIRATORY DISTURBANCE INDEX was 12.2 /hour.  2 events occurred in REM sleep and 37 events in NREM. The REM AHI was 5.3 /hour, versus a non-REM AHI of 10.7. The patient spent 128.5 minutes of total sleep time in the supine position and 176 minutes in non-supine. The supine AHI was 23.4/h versus a non-supine AHI of 0.7.  OXYGEN SATURATION & C02:  The Wake baseline 02 saturation was 96%, with the lowest being 88%. Time spent below 89% saturation equaled 1 minute.   AROUSALS: The arousals were noted as: 56 were spontaneous, 0 were associated with PLMs, and 38 were associated with respiratory events. Audio and video analysis  did not show any abnormal or unusual movements, behaviors, phonations or vocalizations.  No nocturia. The patient had a total of 0 Periodic Limb Movements. Bruxism was noted. Muscle tone was elevated during REM sleep.   EKG was irregular, with intermittent variable R to R intervals. PACs; See screen shot.  Post-study, the patient indicated that sleep was the same as usual, he felt groggy.   IMPRESSION:  1. Mostly Obstructive, but CPOMPLEX Sleep Apnea -AHI of 10.3/h. additional snoring related arousals.  2. Sleep Related Bruxism, noted in REM sleep. This is likely reflecting sleep walking parasomnia.   3. Dysfunctions associated with sleep stages or arousal from sleep 4. Non-specific abnormal EKG, bradycardia and PACs.   RECOMMENDATIONS:  1. Advise full-night, attended, CPAP titration study to optimize therapy. Treatment of apnea may help to achieve better, sounder sleep. 2. We were not able to capture a parasomnia, but noted that muscle tone remained higher than normal during REM sleep. I will ask his primary neurologist to follow with regular MOCA and muscle tone testing.    I certify that I have reviewed the entire raw data recording prior to the issuance of this report in accordance with the Standards of Accreditation of the American Academy of Sleep Medicine (AASM)  Larey Seat, MD   08-27-2018 Diplomat, American Board of Psychiatry and Neurology  Diplomat, American Board of [place] Director, [facility] [employer]

## 2018-08-28 ENCOUNTER — Telehealth: Payer: Self-pay

## 2018-08-28 NOTE — Telephone Encounter (Signed)
Spoke with patient today to schedule CPAP study per MD orders. Explained to patient results of baseline sleep study and Dr. Edwena Felty recommendation. Pt understood and scheduled CPAP study for 09/21/2018. Pt had no questions at this time.

## 2018-08-29 ENCOUNTER — Encounter: Payer: Self-pay | Admitting: Psychology

## 2018-09-12 NOTE — Patient Instructions (Signed)
No charge visit. This was canceled.

## 2018-09-18 ENCOUNTER — Ambulatory Visit: Payer: Medicare Other | Admitting: Cardiology

## 2018-09-20 ENCOUNTER — Encounter: Payer: Self-pay | Admitting: Cardiology

## 2018-09-20 ENCOUNTER — Ambulatory Visit: Payer: Medicare Other | Admitting: Cardiology

## 2018-09-20 VITALS — BP 120/66 | HR 75 | Ht 76.0 in | Wt 183.8 lb

## 2018-09-20 DIAGNOSIS — G4733 Obstructive sleep apnea (adult) (pediatric): Secondary | ICD-10-CM

## 2018-09-20 DIAGNOSIS — I480 Paroxysmal atrial fibrillation: Secondary | ICD-10-CM | POA: Insufficient documentation

## 2018-09-20 DIAGNOSIS — R002 Palpitations: Secondary | ICD-10-CM | POA: Insufficient documentation

## 2018-09-20 DIAGNOSIS — G20 Parkinson's disease: Secondary | ICD-10-CM

## 2018-09-20 NOTE — Patient Instructions (Signed)
Medication Instructions:  Your physician recommends that you continue on your current medications as directed. Please refer to the Current Medication list given to you today.  If you need a refill on your cardiac medications before your next appointment, please call your pharmacy.   Lab work: None.  If you have labs (blood work) drawn today and your tests are completely normal, you will receive your results only by: . MyChart Message (if you have MyChart) OR . A paper copy in the mail If you have any lab test that is abnormal or we need to change your treatment, we will call you to review the results.  Testing/Procedures: Your physician has requested that you have an echocardiogram. Echocardiography is a painless test that uses sound waves to create images of your heart. It provides your doctor with information about the size and shape of your heart and how well your heart's chambers and valves are working. This procedure takes approximately one hour. There are no restrictions for this procedure.    Follow-Up: At [facility], you and your health needs are our priority.  As part of our continuing mission to provide you with exceptional heart care, we have created designated Provider Care Teams.  These Care Teams include your primary Cardiologist (physician) and Advanced Practice Providers (APPs -  Physician Assistants and Nurse Practitioners) who all work together to provide you with the care you need, when you need it. You will need a follow up appointment in 6 months.  Please call our office 2 months in advance to schedule this appointment.  You may see No primary care provider on file. or another member of our [facility] Provider Team in [place]: Brian Munley, MD . Rajan Revankar, MD  Any Other Special Instructions Will Be Listed Below (If Applicable).   Echocardiogram An echocardiogram is a procedure that uses painless sound waves (ultrasound) to produce an image of the  heart. Images from an echocardiogram can provide important information about:  Signs of coronary artery disease (CAD).  Aneurysm detection. An aneurysm is a weak or damaged part of an artery wall that bulges out from the normal force of blood pumping through the body.  Heart size and shape. Changes in the size or shape of the heart can be associated with certain conditions, including heart failure, aneurysm, and CAD.  Heart muscle function.  Heart valve function.  Signs of a past heart attack.  Fluid buildup around the heart.  Thickening of the heart muscle.  A tumor or infectious growth around the heart valves. Tell a health care provider about:  Any allergies you have.  All medicines you are taking, including vitamins, herbs, eye drops, creams, and over-the-counter medicines.  Any blood disorders you have.  Any surgeries you have had.  Any medical conditions you have.  Whether you are pregnant or may be pregnant. What are the risks? Generally, this is a safe procedure. However, problems may occur, including:  Allergic reaction to dye (contrast) that may be used during the procedure. What happens before the procedure? No specific preparation is needed. You may eat and drink normally. What happens during the procedure?   An IV tube may be inserted into one of your veins.  You may receive contrast through this tube. A contrast is an injection that improves the quality of the pictures from your heart.  A gel will be applied to your chest.  A wand-like tool (transducer) will be moved over your chest. The gel will help   to transmit the sound waves from the transducer.  The sound waves will harmlessly bounce off of your heart to allow the heart images to be captured in real-time motion. The images will be recorded on a computer. The procedure may vary among health care providers and hospitals. What happens after the procedure?  You may return to your normal, everyday  life, including diet, activities, and medicines, unless your health care provider tells you not to do that. Summary  An echocardiogram is a procedure that uses painless sound waves (ultrasound) to produce an image of the heart.  Images from an echocardiogram can provide important information about the size and shape of your heart, heart muscle function, heart valve function, and fluid buildup around your heart.  You do not need to do anything to prepare before this procedure. You may eat and drink normally.  After the echocardiogram is completed, you may return to your normal, everyday life, unless your health care provider tells you not to do that. This information is not intended to replace advice given to you by your health care provider. Make sure you discuss any questions you have with your health care provider. Document Released: 07/16/2000 Document Revised: 08/21/2016 Document Reviewed: 08/21/2016 Elsevier Interactive Patient Education  2019 [employer].

## 2018-09-20 NOTE — Progress Notes (Signed)
Cardiology Office Note:    Date:  09/20/2018   ID:  [name], DOB Nov 23, 1945, MRN [id number]  PCP:  Lawerance Cruel, MD  Cardiologist:  Jenne Campus, MD    Referring MD: Lawerance Cruel, MD   Chief Complaint  Patient presents with  . Follow-up  Just for regular follow-up  History of Present Illness:    Ryan Woodward is a 73 y.o. male with a history of paroxysmal atrial fibrillation he remember only one episode of atrial fibrillation that happened when he had colonoscopy done.  Since that time he denies have any problem he never felt any sustained arrhythmia he feels some skipped beats but within last 6 months or only 2 episodes of it very short lasting.  He is not anticoagulated his chads 2 vascular equals 1.  Overall he is sad because he was diagnosed with Parkinson the initial presenting symptom of this disease was memory loss he struggling finding some words sometimes.  That really clearly depressed him.  Recently he was diagnosed to have sleep apnea he is scheduled to have a sleep study for titration of the device.  Hopefully that will help him.  Denies have any chest pain tightness squeezing pressure burning chest.  He speaks with very softlyand he moves rather slowly I do not see any tremor.  Past Medical History:  Diagnosis Date  . A-fib ([place])   . Allergies   . Back pain   . Bladder cancer ([place]) dx aug'12   s/p turbt  . Enlarged prostate with urinary obstruction    nocturia/ freq/ dysuria  . PD (Parkinson's disease) ([place])   . Skin cancer of nose 05/2018   basal cell    Past Surgical History:  Procedure Laterality Date  . CERVICAL FUSION  2000   C4-5  . COLONOSCOPY    . CYSTOSCOPY  06/16/2011   Procedure: CYSTOSCOPY;  Surgeon: Molli Hazard, MD;  Location: [facility];  Service: Urology;  Laterality: N/A;  . CYSTOSCOPY WITH URETHRAL DILATATION  w/ TURBT BX   05-04-11  . SINUS SURGERY    . TRANSURETHRAL RESECTION OF PROSTATE   06/16/2011   Procedure: TRANSURETHRAL RESECTION OF THE PROSTATE (TURP);  Surgeon: Molli Hazard, MD;  Location: [facility];  Service: Urology;  Laterality: N/A;  URETHRAL DILATATION, MEATOTOMY, PENIL BLOCK    Current Medications: Current Meds  Medication Sig  . aspirin EC 81 MG tablet Take 81 mg by mouth daily.  . carbidopa-levodopa (SINEMET IR) 25-100 MG tablet Take 1 tablet by mouth 3 (three) times daily.  . clonazePAM (KLONOPIN) 0.5 MG tablet Take 1/2 to 1 pill at bedtime for sleep disorder (Patient taking differently: Take 0.5 mg by mouth at bedtime. Take 1/2 to 1 pill at bedtime for sleep disorder)  . fluticasone (FLONASE) 50 MCG/ACT nasal spray Place 2 sprays into the nose every morning.    . metoprolol succinate (TOPROL-XL) 25 MG 24 hr tablet Take 0.5 tablets (12.5 mg total) by mouth daily.  . Multiple Vitamins-Minerals (MULTIVITAMIN PO) Take by mouth.  . pantoprazole (PROTONIX) 40 MG tablet Take 40 mg by mouth daily as needed.  . Polyethylene Glycol 3350 (MIRALAX PO) Take by mouth daily.   . sildenafil (REVATIO) 20 MG tablet Take 20 mg by mouth 3 (three) times daily as needed.     Allergies:   Patient has no known allergies.   Social History   Socioeconomic History  . Marital status: Soil scientist  Spouse name: Not on file  . Number of children: 0  . Years of education: Not on file  . Highest education level: Master's degree (e.g., MA, MS, MEng, MEd, MSW, MBA)  Occupational History  . Not on file  Social Needs  . Financial resource strain: Not on file  . Food insecurity:    Worry: Not on file    Inability: Not on file  . Transportation needs:    Medical: Not on file    Non-medical: Not on file  Tobacco Use  . Smoking status: Former Smoker    Last attempt to quit: 09/1985    Years since quitting: 33.0  . Smokeless tobacco: Never Used  Substance and Sexual Activity  . Alcohol use: Yes    Alcohol/week: 14.0 standard drinks    Types: 14  Cans of beer per week    Comment: 2 beers daily  . Drug use: Never  . Sexual activity: Not on file  Lifestyle  . Physical activity:    Days per week: Not on file    Minutes per session: Not on file  . Stress: Not on file  Relationships  . Social connections:    Talks on phone: Not on file    Gets together: Not on file    Attends religious service: Not on file    Active member of club or organization: Not on file    Attends meetings of clubs or organizations: Not on file    Relationship status: Not on file  Other Topics Concern  . Not on file  Social History Narrative   Lives at home with partner   Right handed   Caffeine: 2 cups of coffee each morning      Family History: The patient's family history includes Alzheimer's disease in his maternal uncle and mother; Stroke in his mother. There is no history of Parkinson's disease. ROS:   Please see the history of present illness.    All 14 point review of systems negative except as described per history of present illness  EKGs/Labs/Other Studies Reviewed:      Recent Labs: 05/23/2018: BUN 16; Creatinine, Ser 0.92; Potassium 4.5; Sodium 146  Recent Lipid Panel No results found for: CHOL, TRIG, HDL, CHOLHDL, VLDL, LDLCALC, LDLDIRECT  Physical Exam:    VS:  BP 120/66   Pulse 75   Ht 6\' 4"  (1.93 m)   Wt 183 lb 12.8 oz (83.4 kg)   SpO2 97%   BMI 22.37 kg/m     Wt Readings from Last 3 Encounters:  09/20/18 183 lb 12.8 oz (83.4 kg)  08/25/18 186 lb (84.4 kg)  08/11/18 184 lb (83.5 kg)     GEN:  Well nourished, well developed in no acute distress HEENT: Normal NECK: No JVD; No carotid bruits LYMPHATICS: No lymphadenopathy CARDIAC: RRR, no murmurs, no rubs, no gallops RESPIRATORY:  Clear to auscultation without rales, wheezing or rhonchi  ABDOMEN: Soft, non-tender, non-distended MUSCULOSKELETAL:  No edema; No deformity  SKIN: Warm and dry LOWER EXTREMITIES: no swelling NEUROLOGIC:  Alert and oriented x  3 PSYCHIATRIC:  Normal affect   ASSESSMENT:    1. Parkinsonism, unspecified Parkinsonism type (HCC)   2. Paroxysmal atrial fibrillation ([place])   3. Obstructive sleep apnea   4. Palpitations    PLAN:    In order of problems listed above:  1. Paroxysmal atrial fibrillation only one documented episode.  Chads 2 vascular equals 1 for age not anticoagulated takes aspirin I discussed with him the fact  that aspirin does not have strong data for preventing CVA in people with atrial fibrillation he wants to continue which we will do.  He does have some rare palpitations I think I will wait for treatment of sleep apnea and then reassess the situation.  He may require monitor. 2. Obstructive sleep apnea which been recently recognized.  He is scheduled to have sleep study for mask titration. 3. Cavitations very rare and plan as outlined above. 4. I will ask him to have an echocardiogram done to assess left atrial size that should help me to determine frequency of episode of atrial fibrillation.   Medication Adjustments/Labs and Tests Ordered: Current medicines are reviewed at length with the patient today.  Concerns regarding medicines are outlined above.  No orders of the defined types were placed in this encounter.  Medication changes: No orders of the defined types were placed in this encounter.   Signed, Park Liter, MD, [facility] 09/20/2018 3:31 PM    [place]

## 2018-09-22 ENCOUNTER — Ambulatory Visit (HOSPITAL_BASED_OUTPATIENT_CLINIC_OR_DEPARTMENT_OTHER)
Admission: RE | Admit: 2018-09-22 | Discharge: 2018-09-22 | Disposition: A | Discharge status: Home / Self Care | Payer: Medicare Other | Source: Ambulatory Visit | Attending: Cardiology | Admitting: Cardiology

## 2018-09-22 DIAGNOSIS — I480 Paroxysmal atrial fibrillation: Secondary | ICD-10-CM

## 2018-09-22 NOTE — Progress Notes (Signed)
  Echocardiogram 2D Echocardiogram has been performed.  Adrinne Sze T Merranda Bolls 09/22/2018, 11:40 AM

## 2018-09-23 ENCOUNTER — Ambulatory Visit (INDEPENDENT_AMBULATORY_CARE_PROVIDER_SITE_OTHER): Payer: Medicare Other | Admitting: Neurology

## 2018-09-23 DIAGNOSIS — G4700 Insomnia, unspecified: Secondary | ICD-10-CM

## 2018-09-23 DIAGNOSIS — R002 Palpitations: Secondary | ICD-10-CM

## 2018-09-23 DIAGNOSIS — I480 Paroxysmal atrial fibrillation: Secondary | ICD-10-CM

## 2018-09-23 DIAGNOSIS — G4731 Primary central sleep apnea: Secondary | ICD-10-CM | POA: Diagnosis not present

## 2018-09-23 DIAGNOSIS — G4752 REM sleep behavior disorder: Secondary | ICD-10-CM

## 2018-09-26 ENCOUNTER — Ambulatory Visit: Payer: Medicare Other | Admitting: Neurology

## 2018-09-29 NOTE — Addendum Note (Signed)
Addended by: Larey Seat on: 09/29/2018 03:46 PM   Modules accepted: Orders

## 2018-09-29 NOTE — Procedures (Signed)
PATIENT'S NAME:  Ryan Woodward, Ryan Woodward DOB:      April 20, 1946      MR#:    [id number]     DATE OF RECORDING: 09/23/2018 REFERRING M.D.:  Sarina Ill , MD  PCP is Lawerance Cruel, MD Study Performed:   Titration to positive airway pressure  HISTORY: This patient returns after a Parasomnia Sleep Study form 08-15-2018 revealed the following findings:   1. Mostly Obstructive, but COMPLEX Sleep Apnea -AHI of 10.3/h. with additional snoring related arousals.  2. Sleep Related Bruxism, noted in REM sleep. This is likely reflecting sleep walking parasomnia.   3. abnormal EKG, bradycardia and PACs.  4. The patient was referred for REM BD/ parasomnia, sleep walking- this was not found to be present.    The patient endorsed the Epworth Sleepiness Scale at 11/24 points.   The patient's weight 185 pounds with a height of 76 (inches), resulting in a BMI of 22.6 kg/m2. The patient's neck circumference measured 14.5 inches.  CURRENT MEDICATIONS: Aspirin, Flonase, Toprol, Protonix, Miralax, Revatio, Desyrel, Ambien   PROCEDURE:  This is a multichannel digital polysomnogram utilizing the SomnoStar 11.2 system.  Electrodes and sensors were applied and monitored per AASM Specifications.   EEG, EOG, Chin and Limb EMG, were sampled at 200 Hz.  ECG, Snore and Nasal Pressure, Thermal Airflow, Respiratory Effort, CPAP Flow and Pressure, Oximetry was sampled at 50 Hz. Digital video and audio were recorded.     Different CPAP masks were used , nasal and FFM models. Patient slept the longest under a FFM.  CPAP was initiated at 5 cmH20 with heated humidity per AASM split night standards and pressure was advanced to 15 cmH20 because of hypopneas, apneas and desaturations.  At a PAP pressure of 15 cmH20, there was a reduction of the AHI to 0.0/h with improvement of sleep apnea, but with poor sleep efficiency.  Lights Out was at 22:31 and Lights On at 04:32. Total recording time (TRT) was 361.5 minutes, with a total sleep time (TST) of  204.5 minutes. The patient's sleep latency was 37.5 minutes. REM latency was 0 minutes.  The sleep efficiency was 56.6 %.    SLEEP ARCHITECTURE: WASO (Wake after sleep onset) was 15.5 minutes.  There were 7 minutes in Stage N1, 112.5 minutes Stage N2, 85 minutes Stage N3 and 0 minutes in Stage REM.  The percentage of Stage N1 was 3.4%, Stage N2 was 55.%, Stage N3 was 41.6% and Stage R (REM sleep) was 0%. The sleep architecture was notable for long periods of wakefulness, loss of REM sleep.   RESPIRATORY ANALYSIS:  There was a total of 35 respiratory events: 4 obstructive apneas, 0 central apneas and 0 mixed apneas with a total of 4 apneas and an apnea index (AI) of 1.2 /hour. There were 31 hypopneas with a hypopnea index of 9.1/hour. The patient also had 0 respiratory event related arousals (RERAs).      The total APNEA/HYPOPNEA INDEX  (AHI) was 10.3 /hour and the total RESPIRATORY DISTURBANCE INDEX was 10.3 /hour  0 events occurred in REM sleep and 35 events in NREM. The REM AHI was 0 /hour versus a non-REM AHI of 10.3 /hour.  The patient spent 56 minutes of total sleep time in the supine position and 149 minutes in non-supine. The supine AHI was 26.8, versus a non-supine AHI of 4.0.  OXYGEN SATURATION & C02:  The baseline 02 saturation was 92%, with the lowest being 90%. Time spent below 89% saturation equaled 0 minutes.  AROUSALS:  The arousals were noted as: 7 were spontaneous, 0 were associated with PLMs, and 23 were associated with respiratory events. The patient had a total of 6 Periodic Limb Movements. The Periodic Limb Movement (PLM) index was 1.8 and the PLM Arousal index was 0.0 /hour.  Audio and video analysis did not show any abnormal or unusual movements, behaviors, phonations or vocalizations.   The patient took bathroom breaks. Snoring was not noted. EKG in atrial fibrillation with arrhythmia.   Post-study, the patient indicated that sleep was the same as usual.    DIAGNOSIS 1. Complex Sleep Apnea responded to CPAP pressures of 15 cm water but did not achieve Insomnia control. Autotitration CPAP device will be recommended. No central apnea emerged.    2. abnormal EKG 3. Insomnia.   PLANS/RECOMMENDATIONS: start autotitration CPAP therapy with heated humidity and a pressure window between and 18 cm water pressure, and 3 cm EPR. Patient's mask of choice should be used.   1. CPAP therapy compliance is defined as 4 hours or more of nightly use  A follow up appointment will be scheduled in the Sleep Clinic at [facility].   Please call [phone] with any questions.     I certify that I have reviewed the entire raw data recording prior to the issuance of this report in accordance with the Standards of Accreditation of the American Academy of Sleep Medicine (AASM)   Larey Seat, M.D. 09-29-2018 Diplomat, American Board of Psychiatry and Neurology  Diplomat, [place] [facility] Medical Director, [place] [facility] at [facility]

## 2018-09-30 ENCOUNTER — Telehealth: Payer: Self-pay | Admitting: Neurology

## 2018-09-30 NOTE — Telephone Encounter (Signed)
Called patient to discuss sleep test EKG which showed atrial fibrillation. Would see if Dr. Harrington Challenger wants to send him to Cardiology, at high risk for stroke.   Bethany, fyi if he calls back. We should probably make sure Dr. Harrington Challenger got the message we saw afib overnight and see if he wants to send patient to cardiology. Maybe we can call the office and leave a message with his nurse. If patient calls back let him know.  Casey: Does Dr. Brett Fairy address the Afib seen on EKG of sleep study?

## 2018-10-01 NOTE — Telephone Encounter (Signed)
Back to dr ahern 

## 2018-10-02 ENCOUNTER — Telehealth: Payer: Self-pay | Admitting: Neurology

## 2018-10-02 NOTE — Telephone Encounter (Signed)
Spoke to patient, Dr. Harrington Challenger is aware that patient has afib. I would discuss with Dr. Harrington Challenger and cardiologist again to see if anticoagulation is indicated, at high risk for stroke. Patient states they are aware. Please let them know it was seen on sleep study.

## 2018-10-02 NOTE — Telephone Encounter (Signed)
-----   Message from Larey Seat, MD sent at 09/29/2018  3:46 PM EST ----- DIAGNOSIS 1. Complex Sleep Apnea responded to CPAP pressures of 15 cm water  but did not achieve Insomnia control. Autotitration CPAP device  will be recommended. No central apnea emerged.   2. abnormal EKG, Atrial fibrillation is present   3. Insomnia.   PLANS/RECOMMENDATIONS: start autotitration CPAP therapy with  heated humidity and a pressure window between and 18 cm water  pressure, and 3 cm EPR. Patient's mask of choice should be used.   1. CPAP therapy compliance is defined as 4 hours or more of  nightly use. 2. REM BD could not be evaluated due to lack of REM sleep.

## 2018-10-02 NOTE — Telephone Encounter (Signed)
I called pt. I advised pt that Dr. Brett Fairy reviewed their sleep study results and found that pt has sleep apnea that was treated under CPAP. Dr. Brett Fairy recommends that pt starts auto CPAP. I reviewed PAP compliance expectations with the pt. Pt is agreeable to starting a CPAP. I advised pt that an order will be sent to a DME, [facility], and [facility] will call the pt within about one week after they file with the pt's insurance. [facility] will show the pt how to use the machine, fit for masks, and troubleshoot the CPAP if needed. A follow up appt was made for insurance purposes with Ward Givens, NP on May 27,2020 at 1:30 pm. Pt verbalized understanding to arrive 15 minutes early and bring their CPAP. A letter with all of this information in it will be mailed to the pt as a reminder. I verified with the pt that the address we have on file is correct. Pt verbalized understanding of results. Pt had no questions at this time but was encouraged to call back if questions arise. I have sent the order to [facility] and have received confirmation that they have received the order.

## 2018-10-02 NOTE — Telephone Encounter (Signed)
Called patient to discuss sleep study results. No answer at this time. LVM for the patient to call back.   

## 2018-11-01 ENCOUNTER — Telehealth: Payer: Self-pay | Admitting: Cardiology

## 2018-11-07 ENCOUNTER — Telehealth: Payer: Self-pay | Admitting: Neurology

## 2018-11-07 NOTE — Telephone Encounter (Signed)
I called and spoke with the patient, he did consent to changing his visit to a VV. I went over the insurance protocol and he consented to [place] filing his insurance. I verified his email and schedule via webex.   Meeting number:     [phone] NWGNFAOZ:     [id number] Host key:     [id number]

## 2018-11-07 NOTE — Telephone Encounter (Signed)
done

## 2018-11-07 NOTE — Telephone Encounter (Signed)
Looks good. thanks

## 2018-11-13 NOTE — Telephone Encounter (Signed)
Called pt & LVM with office number asking for call back before his appt tomorrow.   Need to update his chart.

## 2018-11-14 ENCOUNTER — Encounter: Payer: Self-pay | Admitting: Neurology

## 2018-11-14 ENCOUNTER — Ambulatory Visit (INDEPENDENT_AMBULATORY_CARE_PROVIDER_SITE_OTHER): Payer: Medicare Other | Admitting: Neurology

## 2018-11-14 ENCOUNTER — Telehealth: Payer: Self-pay | Admitting: Neurology

## 2018-11-14 ENCOUNTER — Other Ambulatory Visit: Payer: Self-pay

## 2018-11-14 DIAGNOSIS — I480 Paroxysmal atrial fibrillation: Secondary | ICD-10-CM

## 2018-11-14 DIAGNOSIS — G4733 Obstructive sleep apnea (adult) (pediatric): Secondary | ICD-10-CM

## 2018-11-14 DIAGNOSIS — R413 Other amnesia: Secondary | ICD-10-CM

## 2018-11-14 DIAGNOSIS — G20 Parkinson's disease: Secondary | ICD-10-CM

## 2018-11-14 DIAGNOSIS — R1310 Dysphagia, unspecified: Secondary | ICD-10-CM

## 2018-11-14 DIAGNOSIS — R4701 Aphasia: Secondary | ICD-10-CM

## 2018-11-14 MED ORDER — CARBIDOPA-LEVODOPA 25-100 MG PO TABS: 1.0000 | ORAL_TABLET | Freq: Three times a day (TID) | ORAL | 4 refills | Status: DC

## 2018-11-14 NOTE — Telephone Encounter (Signed)
Updated chart for VV.

## 2018-11-14 NOTE — Progress Notes (Addendum)
[facility]    Provider:  Dr Jaynee Eagles Referring Provider: Lawerance Cruel, MD Primary Care Physician:  Lawerance Cruel, MD  CC:  memory loss, +DAT Scan c/w Parkinson's disease spectrum  Interval history 11/14/2018:  Virtual Visit via Video Note  I connected with Ryan Woodward on 11/14/18 at 10:30 AM EDT by a video enabled telemedicine application and verified that I am speaking with the correct person using two identifiers.   I discussed the limitations of evaluation and management by telemedicine and the availability of in person appointments. The patient expressed understanding and agreed to proceed.   He was diagnosed with OSA and started on cpap. He saw Dr. Carles Collet who suspects he is in the pre-motor phase of Parkinson's Disease. He was referred for Neurocognitive testing at [facility]. Also suggested speech therapy. Clonazepam for REM sleep disorder. He is feeling well. He stammers and gets frustrated. I recommended speech therapy. It has been a little stressful recently because of cancellations he has to deal with and getting people back money. Will refer for speech therapy. He has an appointment for formal neurocognitive testing. I recommended his partner go to every appointment He is on clonazepam and feels his REM sleep disorder is better. He is on a cpap. He still feels like he flails and we can increase the medication as needed. He has no side effects to the Sinemet, he will start taking a whole tablet tid q4hrs. Refer to cardiologist. Will reach out to cardiologist about his afib and asa. I encouraged him to take his partner to all the appointments, I don;t think he was able to tell me accurately what Dr. Carles Collet had explained to him. He is having swallowing difficulties, coughing, discussed swallow evaluation.  Interval history 08/11/2018: Here with partner.  Patient presented initially to me with concerns of memory loss, exam showed parkinsonism.  DAT scan was  consistent with a Parkinson spectrum disorder.  Discussed in detail Parkinson's syndromes, Lewy Body Dementia vs Parkinson's Disease.  Also discussed pathophysiology, possible etiologies, progression of disorder, modifying factors such as starting medication and exercise.  Also provided community resources and on line sites for information.  We will start Sinemet, his partner reports that he has slight confusion so on to stay away from the dopamine agonists and will start Sinemet at this time, also recommended Parkinson specific exercise classes, however Parkinson's another community resources.  We also discussed that there is a Parkinson's clinic in this area with an excellent specialist Wells Guiles Tat and they would like to see her.  HPI:  Ryan Woodward is a 73 y.o. male here as requested by Dr. Harrington Challenger for memory problems.  Past medical history of atrial fibrillation, insomnia, memory difficulties, bladder cancer.  He is a former smoker. He feels he has memory problems. He can't think of a world, will remember in a few seconds, been ongoing for 5-6 months. Unclear if it is getting worse. A few times a day. Sometimes he describes the object and he is frustrated and stops. He has a partner for 39 years. His partner doesn't think there is anything wrong. 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 Mother had alzheimers. Mother started having memory issues at the age of 57. Still working and has his own business. He has noticed he goes to the computer to pay bills and he has to stop and think about what to do at the computer. He loses things in the house ongoing for a few  months. Driving is fine, not gettig lost or accidents, he usually knows the day and date, he remembers important dates. Possibly after his bladder cancer chemo and surgery in 2013 his partner noticed he started having nightmares, always involves someone chasing him, then he wouldn't remember the dreams, act out the dreams and he talks gibberish in  his sleep he has found his bed sheets on the floor, once he got up and was going to leave in his underwear and his partner stopped him. He notices things on the night stand on the floor. No falls or imbalance. No tremors. Voice is softer. No changes in handwriting. He has constipation and loss of smell.   Reviewed notes, labs and imaging from outside physicians, which showed:  Reviewed notes from referring physician Dr. Harrington Challenger.  Patient feels his memory has been progressively worsening especially in the months prior to last visit which was July 2019.  Memory is gotten worse, dropping words, trouble finishing sentences.  Mother and maternal uncle had Alzheimer's as well as other relatives.  He feels as though he flails in bed, trazodone helps some, has grogginess in the morning.  B12 and thyroid were ordered at last appointment, also CMP was ordered, he takes trazodone at night for insomnia.  TSH 2.9, CMP unremarkable with BUN 13 and creatinine 0.91, vitamin B12 quantity was not sufficient for analysis  Review of Systems: Patient complains of symptoms per HPI as well as the following symptoms: dizziness, memory loss, afib,sleep apnea, constipation. Pertinent negatives and positives per HPI. All others negative.   Social History   Socioeconomic History   Marital status: Soil scientist    Spouse name: Not on file   Number of children: 0   Years of education: Not on file   Highest education level: Master's degree (e.g., MA, MS, MEng, MEd, MSW, MBA)  Occupational History   Not on file  Social Needs   Financial resource strain: Not on file   Food insecurity:    Worry: Not on file    Inability: Not on file   Transportation needs:    Medical: Not on file    Non-medical: Not on file  Tobacco Use   Smoking status: Former Smoker    Last attempt to quit: 09/1985    Years since quitting: 33.2   Smokeless tobacco: Never Used  Substance and Sexual Activity   Alcohol use: Yes     Alcohol/week: 14.0 standard drinks    Types: 14 Cans of beer per week    Comment: 2 beers daily   Drug use: Never   Sexual activity: Not on file  Lifestyle   Physical activity:    Days per week: Not on file    Minutes per session: Not on file   Stress: Not on file  Relationships   Social connections:    Talks on phone: Not on file    Gets together: Not on file    Attends religious service: Not on file    Active member of club or organization: Not on file    Attends meetings of clubs or organizations: Not on file    Relationship status: Not on file   Intimate partner violence:    Fear of current or ex partner: Not on file    Emotionally abused: Not on file    Physically abused: Not on file    Forced sexual activity: Not on file  Other Topics Concern   Not on file  Social History Narrative   Lives  at home with partner   Right handed   Caffeine: 2 cups of coffee each morning     Family History  Problem Relation Age of Onset   Alzheimer's disease Mother    Stroke Mother    Alzheimer's disease Maternal Uncle    Parkinson's disease Neg Hx     Past Medical History:  Diagnosis Date   A-fib [facility])    Allergies    Back pain    Bladder cancer ([place]) dx aug'12   s/p turbt   Enlarged prostate with urinary obstruction    nocturia/ freq/ dysuria   OSA on CPAP    PD (Parkinson's disease) ([place])    Skin cancer of nose 05/2018   basal cell    Past Surgical History:  Procedure Laterality Date   CERVICAL FUSION  2000   C4-5   COLONOSCOPY     CYSTOSCOPY  06/16/2011   Procedure: CYSTOSCOPY;  Surgeon: Molli Hazard, MD;  Location: [facility];  Service: Urology;  Laterality: N/A;   CYSTOSCOPY WITH URETHRAL DILATATION  w/ TURBT BX   05-04-11   SINUS SURGERY     TRANSURETHRAL RESECTION OF PROSTATE  06/16/2011   Procedure: TRANSURETHRAL RESECTION OF THE PROSTATE (TURP);  Surgeon: Molli Hazard, MD;  Location: [facility];  Service: Urology;  Laterality: N/A;  URETHRAL DILATATION, MEATOTOMY, PENIL BLOCK    Current Outpatient Medications  Medication Sig Dispense Refill   aspirin EC 81 MG tablet Take 81 mg by mouth daily.     carbidopa-levodopa (SINEMET IR) 25-100 MG tablet Take 1 tablet by mouth 3 (three) times daily. Take them 4-5 hours apart. For example 8am, 12 and 4pm or similar.  Do not eat protein within 30 minutes of taking tabs. 270 tablet 4   clonazePAM (KLONOPIN) 0.5 MG tablet Take 1/2 to 1 pill at bedtime for sleep disorder (Patient taking differently: Take 0.5 mg by mouth at bedtime. Take 1/2 to 1 pill at bedtime for sleep disorder) 30 tablet 4   fluticasone (FLONASE) 50 MCG/ACT nasal spray Place 2 sprays into the nose every morning.       metoprolol succinate (TOPROL-XL) 25 MG 24 hr tablet Take 0.5 tablets (12.5 mg total) by mouth daily. 30 tablet 1   Multiple Vitamins-Minerals (MULTIVITAMIN PO) Take by mouth.     pantoprazole (PROTONIX) 40 MG tablet Take 40 mg by mouth daily as needed.     Polyethylene Glycol 3350 (MIRALAX PO) Take by mouth daily.      sildenafil (REVATIO) 20 MG tablet Take 20 mg by mouth 3 (three) times daily as needed.     No current facility-administered medications for this visit.     Allergies as of 11/14/2018   (No Known Allergies)    Vitals: There were no vitals taken for this visit. Last Weight:  Wt Readings from Last 1 Encounters:  09/20/18 183 lb 12.8 oz (83.4 kg)   Last Height:   Ht Readings from Last 1 Encounters:  09/20/18 6\' 4"  (1.93 m)    PRIOR EXAM could not perform over virtual video visit.  Physical exam: Exam: Gen: NAD, conversant, well nourised, thin, well groomed                     CV: RRR, no MRG. No Carotid Bruits. No peripheral edema, warm, nontender Eyes: Conjunctivae clear without exudates or hemorrhage  Neuro: Detailed Neurologic Exam  Speech:    Speech is normal; fluent and spontaneous with  normal  comprehension.  Cognition:  MMSE - Mini Mental State Exam 05/23/2018  Orientation to time 5  Orientation to Place 5  Registration 3  Attention/ Calculation 5  Recall 3  Language- name 2 objects 2  Language- repeat 1  Language- follow 3 step command 3  Language- read & follow direction 1  Write a sentence 1  Copy design 1  Total score 30       The patient is oriented to person, place, and time;     recent and remote memory intact;     language fluent;     normal attention, concentration,     fund of knowledge Cranial Nerves: mild hypomimia    The pupils are round, anisocoria right 85mm larger but appear to be equally reactive to light. Attempted fundoscopic exam could not visualize.  Visual fields are full to finger confrontation. Mildly impaired upgaze otherwise extraocular movements are intact. Trigeminal sensation is intact and the muscles of mastication are normal. The face is symmetric. The palate elevates in the midline. Hearing intact. Voice is hypophonic. Shoulder shrug is normal. The tongue has normal motion without fasciculations.   Coordination:    Normal finger to nose and heel to shin. Normal finger taps  Gait:    Heel-toe normal, and tandem gait with minimal imbalance. Decreased arm swing on the right.  Motor Observation:    No asymmetry, no atrophy, and no involuntary movements noted. Tone:    Normal muscle tone.  No cogwheeling.   Posture:    Posture is normal. normal erect    Strength:    Strength is V/V in the upper and lower limbs.      Sensation: intact to LT     Reflex Exam:  DTR's: absent AJs. 2+ right patella with crossed reflex, trace left patellar, 1+ biceps   Toes: left down, right equiv     Clonus:    Clonus is absent. Neg hoffman.        Assessment/Plan:  14 73 year old with 1st degree FHx of Dementia in mother (Alzheimers?). He is exhibiting aphasia and memory loss. Also parkinsonian signs such as decreased arm swing, hypomimia,  hypophonia, possible REM Sleep disorder or PLMS, loss of smell and constipation. +DaT scan.  -DaTscan consistent with Parkinson syndrome.  Had a long discussion about what this means, Lewy body dementia versus Parkinson's disease versus other disorders.  I suspect patient has Parkinson's disease. Appreciate Dr. Doristine Devoid expert second opinion  who believes he is in the pre-motor phase of Parkinson's Disease. - skin cancer association, recommended regular skin -Diagnosed with OSA here in our office, on cpap now - Has formal Neurocognitive testing, appointment with Dr. Sima Matas in 2 weeks. Advised his partner should accompany him on all appointments. -Provided information on community resouces - stressed exercise as a way to slow down progression - consider clonazepam for REM sleep disorder as a way to slow down the disease,  - Continue Sinemet. Will stay aware from the [facility] because of reports from partner of more confusion in patient - REM sleep disorder: continue Clonazepam - He saw cardiology for afib, he wanted to me follow up and see if they recommend anti-coagulation at this time. Again I suggested he bring his partner to all appointments because I am not sure he is comprehending or retaining everything he is told. - He "stammers" and gets frustrated. I recommended speech therapy. - Again,  encouraged him to take his partner to all the appointments, I don;t  think he was able to tell me accurately what was discussed in recent appointments. Left a message for partner on his cell regarding this.  - Difficulty swallowing: swallow and speech eval  Follow Up Instructions:   I discussed the assessment and treatment plan with the patient. The patient was provided an opportunity to ask questions and all were answered. The patient agreed with the plan and demonstrated an understanding of the instructions.   The patient was advised to call back or seek an in-person evaluation if the symptoms worsen or if  the condition fails to improve as anticipated.  A total of 40 minutes was spent video face-to-face with this patient. Over half this time was spent on counseling patient on the  1. Parkinsonism, unspecified Parkinsonism type ([place])   2. Obstructive sleep apnea   3. Paroxysmal atrial fibrillation (HCC)   4. Memory loss   5. Aphasia   6. Dysphagia, unspecified type    diagnosis and different diagnostic and therapeutic options, counseling and coordination of care, risks ans benefits of management, compliance, or risk factor reduction and education.     Melvenia Beam, MD  Orders Placed This Encounter  Procedures   DG OP Swallowing Func-Medicare/Speech Path   Ambulatory referral to Speech Therapy   SLP modified barium swallow   Meds ordered this encounter  Medications   carbidopa-levodopa (SINEMET IR) 25-100 MG tablet    Sig: Take 1 tablet by mouth 3 (three) times daily. Take them 4-5 hours apart. For example 8am, 12 and 4pm or similar.  Do not eat protein within 30 minutes of taking tabs.    Dispense:  270 tablet    Refill:  4    Cc: Dr. Lovena Le, MD  [facility] [address] [place], [place] [address]  Phone [phone] Fax [phone]

## 2018-11-14 NOTE — Telephone Encounter (Signed)
Pt called to inform provider that he does have a bit of difficulty swallowing. This is something that involves his visit from today. Please advise.

## 2018-11-14 NOTE — Telephone Encounter (Signed)
Then I would recommend a swallow study. I will order this unsure when it will be done however.

## 2018-11-14 NOTE — Addendum Note (Signed)
Addended by: Sarina Ill B on: 11/14/2018 02:47 PM   Modules accepted: Orders

## 2018-11-14 NOTE — Telephone Encounter (Signed)
Spoke with pt and advised Dr. Jaynee Eagles has ordered a swallow study. Unsure when they will schedule d/t pandemic but they will call him. Pt verbalized appreciation. He said he will be gone most of May.

## 2018-11-27 ENCOUNTER — Other Ambulatory Visit: Payer: Self-pay

## 2018-11-27 ENCOUNTER — Encounter: Payer: Medicare Other | Attending: Psychology | Admitting: Psychology

## 2018-11-27 ENCOUNTER — Encounter: Payer: Self-pay | Admitting: Psychology

## 2018-11-27 DIAGNOSIS — R413 Other amnesia: Secondary | ICD-10-CM
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
## 2018-12-19 ENCOUNTER — Telehealth: Payer: Self-pay

## 2018-12-19 NOTE — Telephone Encounter (Signed)
Unable to get in contact with the patient to convert their office visit with [facility] on 12/27/2018 into a doxy.me visit. I left a voicemail asking the patient to return my call. Office number was provided.   If patient calls back please convert their office visit into a doxy.me visit.

## 2018-12-26 ENCOUNTER — Encounter: Payer: Self-pay | Admitting: Neurology

## 2018-12-26 NOTE — Telephone Encounter (Signed)
LMVM for pt at mobile and home to call back re: converting appt to doxy.me.

## 2018-12-27 ENCOUNTER — Ambulatory Visit (INDEPENDENT_AMBULATORY_CARE_PROVIDER_SITE_OTHER): Payer: Medicare Other | Admitting: Adult Health

## 2018-12-27 ENCOUNTER — Other Ambulatory Visit: Payer: Self-pay

## 2018-12-27 ENCOUNTER — Encounter: Payer: Self-pay | Admitting: Adult Health

## 2018-12-27 DIAGNOSIS — G4733 Obstructive sleep apnea (adult) (pediatric): Secondary | ICD-10-CM | POA: Diagnosis not present

## 2018-12-27 DIAGNOSIS — Z9989 Dependence on other enabling machines and devices: Secondary | ICD-10-CM | POA: Diagnosis not present

## 2018-12-27 NOTE — Progress Notes (Signed)
PATIENT: Ryan Woodward DOB: 1946/04/24  REASON FOR VISIT: follow up HISTORY FROM: patient  Virtual Visit via Video Note  I connected with Ryan Woodward on 12/27/18 at  1:30 PM EDT by a video enabled telemedicine application located remotely at [facility] and verified that I am speaking with the correct person using two identifiers who was located at their own home.   I discussed the limitations of evaluation and management by telemedicine and the availability of in person appointments. The patient expressed understanding and agreed to proceed.   PATIENT: Ryan Woodward DOB: Jul 24, 1946  REASON FOR VISIT: follow up HISTORY FROM: patient  HISTORY OF PRESENT ILLNESS: Today 12/27/18:  Ryan Woodward is a 73 year old male with a history of obstructive sleep apnea on CPAP.  He returns today for a virtual visit.  His CPAP download indicates that he uses machine nightly for compliance of 100%.  He uses machine greater than 4 hours 29 out of 30 days for compliance of 97%.  On average he uses his machine 8 hours and 2 minutes.  His residual AHI is 2.1 on 5 to 18 cm of water with EPR 3.  He does not have a significant leak.  He is still trying to used to using of the mask.  He reports that he has a full facemask.  He has not yet seen the benefit of using the CPAP but plans to keep on trying it.  He joins me today for virtual visit.  HISTORY (Copied from Dr.Dohmeier's note)Ryan Woodward is a 73 y.o. male patient seen here 06-26-2018  in a referral from Dr Jaynee Eagles  Via Dr. Harrington Challenger for a sleep evaluation.    Chief complaint according to patient : " I wake up fighting, flailing, kicking, sometimes talking jibberish". My partner caught me two nights ago leaving the bed. The most significant episode was in a [place] hotel on vacation. He was just in time hindered to leave the room in his underwear.  He remains unaware of these spells, and doesn't remember.  He is usually someone who easily  goes to sleep, but over the last 3-5 years has had nightmares more and more frequently and has problems sleeping through the night.    Sleep habits are as follows: The average dinnertime for Ryan Woodward and his partner is around 6 not later than 7, and is usually without alcohol.  Bedtime is around 10 PM.  The bedroom is cool, quiet but not  dark  ( a low volume TV runs in the background) and Ryan Woodward has no trouble initiating sleep at first. He sleeps on his sides and uses a thick pillow for head and neck support.  No RLS. He wakes up with 2 hours for nocturition. His partner noted his activity - acing out dreams- within the first 2 hours of sleep. He has never fallen out of bed. He has pushed things off the night stand. This happened with increasing frequency. By 2-3 AM he takes a trazodone to get more sleep. This on 3 night of the week. Currently no Ambien 9 this was only used for a few days after [facility] surgery).    REVIEW OF SYSTEMS: Out of a complete 14 system review of symptoms, the patient complains only of the following symptoms, and all other reviewed systems are negative.  See HPI  ALLERGIES: No Known Allergies  HOME MEDICATIONS: Outpatient Medications Prior to Visit  Medication Sig Dispense Refill  . aspirin  EC 81 MG tablet Take 81 mg by mouth daily.    . carbidopa-levodopa (SINEMET IR) 25-100 MG tablet Take 1 tablet by mouth 3 (three) times daily. Take them 4-5 hours apart. For example 8am, 12 and 4pm or similar.  Do not eat protein within 30 minutes of taking tabs. 270 tablet 4  . clonazePAM (KLONOPIN) 0.5 MG tablet Take 1/2 to 1 pill at bedtime for sleep disorder (Patient taking differently: Take 0.5 mg by mouth at bedtime. Take 1/2 to 1 pill at bedtime for sleep disorder) 30 tablet 4  . fluticasone (FLONASE) 50 MCG/ACT nasal spray Place 2 sprays into the nose every morning.      . metoprolol succinate (TOPROL-XL) 25 MG 24 hr tablet Take 0.5 tablets (12.5 mg total) by mouth  daily. 30 tablet 1  . Multiple Vitamins-Minerals (MULTIVITAMIN PO) Take by mouth.    . pantoprazole (PROTONIX) 40 MG tablet Take 40 mg by mouth daily as needed.    . Polyethylene Glycol 3350 (MIRALAX PO) Take by mouth daily.     . sildenafil (REVATIO) 20 MG tablet Take 20 mg by mouth 3 (three) times daily as needed.     No facility-administered medications prior to visit.     PAST MEDICAL HISTORY: Past Medical History:  Diagnosis Date  . A-fib ([place])   . Allergies   . Back pain   . Bladder cancer ([place]) dx aug'12   s/p turbt  . Enlarged prostate with urinary obstruction    nocturia/ freq/ dysuria  . OSA on CPAP   . PD (Parkinson's disease) ([place])   . Skin cancer of nose 05/2018   basal cell    PAST SURGICAL HISTORY: Past Surgical History:  Procedure Laterality Date  . CERVICAL FUSION  2000   C4-5  . COLONOSCOPY    . CYSTOSCOPY  06/16/2011   Procedure: CYSTOSCOPY;  Surgeon: Molli Hazard, MD;  Location: [facility];  Service: Urology;  Laterality: N/A;  . CYSTOSCOPY WITH URETHRAL DILATATION  w/ TURBT BX   05-04-11  . SINUS SURGERY    . TRANSURETHRAL RESECTION OF PROSTATE  06/16/2011   Procedure: TRANSURETHRAL RESECTION OF THE PROSTATE (TURP);  Surgeon: Molli Hazard, MD;  Location: [facility];  Service: Urology;  Laterality: N/A;  URETHRAL DILATATION, MEATOTOMY, PENIL BLOCK    FAMILY HISTORY: Family History  Problem Relation Age of Onset  . Alzheimer's disease Mother   . Stroke Mother   . Alzheimer's disease Maternal Uncle   . Parkinson's disease Neg Hx     SOCIAL HISTORY: Social History   Socioeconomic History  . Marital status: Soil scientist    Spouse name: Not on file  . Number of children: 0  . Years of education: Not on file  . Highest education level: Master's degree (e.g., MA, MS, MEng, MEd, MSW, MBA)  Occupational History  . Not on file  Social Needs  . Financial resource strain: Not on file  . Food  insecurity:    Worry: Not on file    Inability: Not on file  . Transportation needs:    Medical: Not on file    Non-medical: Not on file  Tobacco Use  . Smoking status: Former Smoker    Last attempt to quit: 09/1985    Years since quitting: 33.3  . Smokeless tobacco: Never Used  Substance and Sexual Activity  . Alcohol use: Yes    Alcohol/week: 14.0 standard drinks    Types: 14 Cans of beer  per week    Comment: 2 beers daily  . Drug use: Never  . Sexual activity: Not on file  Lifestyle  . Physical activity:    Days per week: Not on file    Minutes per session: Not on file  . Stress: Not on file  Relationships  . Social connections:    Talks on phone: Not on file    Gets together: Not on file    Attends religious service: Not on file    Active member of club or organization: Not on file    Attends meetings of clubs or organizations: Not on file    Relationship status: Not on file  . Intimate partner violence:    Fear of current or ex partner: Not on file    Emotionally abused: Not on file    Physically abused: Not on file    Forced sexual activity: Not on file  Other Topics Concern  . Not on file  Social History Narrative   Lives at home with partner   Right handed   Caffeine: 2 cups of coffee each morning       PHYSICAL EXAM Generalized: Well developed, in no acute distress   Neurological examination  Mentation: Alert oriented to time, place, history taking. Follows all commands speech and language fluent Cranial nerve II-XII:Extraocular movements were full. Facial symmetry noted. uvula tongue midline. Head turning and shoulder shrug  were normal and symmetric. Motor: Good strength throughout subjectively per patient Sensory: Sensory testing is intact to soft touch on all 4 extremities subjectively per patient Coordination: Cerebellar testing reveals good finger-nose-finger  Gait and station: Patient is able to stand from a seated position. gait is normal.   Reflexes: UTA  DIAGNOSTIC DATA (LABS, IMAGING, TESTING) - I reviewed patient records, labs, notes, testing and imaging myself where available.  Lab Results  Component Value Date   HGB 13.9 06/16/2011   HCT 41.0 06/16/2011      Component Value Date/Time   NA 146 (H) 05/23/2018 1453   K 4.5 05/23/2018 1453   CL 106 05/23/2018 1453   CO2 25 05/23/2018 1453   GLUCOSE 84 05/23/2018 1453   GLUCOSE 127 (H) 06/16/2011 1430   BUN 16 05/23/2018 1453   CREATININE 0.92 05/23/2018 1453   CALCIUM 8.9 05/23/2018 1453   GFRNONAA 83 05/23/2018 1453   GFRAA 96 05/23/2018 1453      ASSESSMENT AND PLAN 73 y.o. year old male  has a past medical history of A-fib ([place]), Allergies, Back pain, Bladder cancer ([place]) (dx aug'12), Enlarged prostate with urinary obstruction, OSA on CPAP, PD (Parkinson's disease) ([place]), and Skin cancer of nose (05/2018). here with:  1.  Obstructive sleep apnea on CPAP  The patient download showed excellent compliance and good treatment of his apnea.  He is encouraged to continue using the CPAP nightly and greater than 4 hours each night.  I did try to encourage the patient that the more consistent he is with using CPAP he should start to see the benefit.  He voiced understanding.  He will follow-up in 6 months or sooner if needed.   I spent 15 minutes with the patient this time was spent reviewing his CPAP download and plan of care.   Ward Givens, MSN, NP-C 12/27/2018, 1:15 PM [facility] [address], [place], [place] [address] [phone]

## 2018-12-27 NOTE — Progress Notes (Signed)
I have read the note, and I agree with the clinical assessment and plan.  Shirl Weir K Jaiya Mooradian   

## 2018-12-27 NOTE — Telephone Encounter (Signed)
Called pt and Due to current COVID 19 pandemic, our office is severely reducing in office visits until further notice, in order to minimize the risk to our patients and healthcare providers.  Pt understands that although there may be some limitations with this type of visit, we will take all precautions to reduce any security or privacy concerns.  Pt understands that this will be treated like an in office visit and we will file with pt's insurance, and there may be a patient responsible charge related to this service. Pt consented to doxy.me. email sent to [email or web address]

## 2018-12-28 ENCOUNTER — Ambulatory Visit: Payer: Self-pay | Admitting: Surgery

## 2018-12-28 NOTE — H&P (Signed)
History of Present Illness Ryan Woodward. Ryan Redder MD; 12/28/2018 3:13 PM) The patient is a 73 year old male who presents with an inguinal hernia. Referred by Dr. Lona Kettle  This is a 73 year old male with parkinsonism and obstructive sleep apnea requiring CPAP who presents with a five-week history of some discomfort in his left groin. He has developed some swelling in this area. He denies any obstructive symptoms. He denies any pain or swelling in his right groin. The patient has a history of atrial fibrillation and is followed by cardiology. His last visit was in February of this year. He does wear his CPAP mask every night.   Past Surgical History [name] [place], RMA; 12/28/2018 2:46 PM) Colon Polyp Removal - Colonoscopy Spinal Surgery - Neck Tonsillectomy TURP  Diagnostic Studies History [name] [place], RMA; 12/28/2018 2:46 PM) Colonoscopy 1-5 years ago  Allergies [name] [place], RMA; 12/28/2018 2:46 PM) No Known Drug Allergies [12/28/2018]: Allergies Reconciled  Medication History [employer], RMA; 12/28/2018 2:48 PM) Carbidopa-Levodopa (25-100MG  Tablet, Oral) Active. clonazePAM (0.5MG  Tablet, Oral) Active. Metoprolol Succinate ER (25MG  Tablet ER 24HR, Oral) Active. Aspirin (81MG  Tablet DR, Oral) Active. Fluticasone Propionate (50MCG/ACT Suspension, Nasal) Active. Multi Vitamin (Oral) Active. Pantoprazole Sodium (40MG  Tablet DR, Oral) Active. MiraLax (Oral) Active. Sildenafil Citrate (20MG  Tablet, Oral) Active. Medications Reconciled  Other Problems [name] [place], RMA; 12/28/2018 2:46 PM) Atrial Fibrillation Bladder Problems Cancer Enlarged Prostate Sleep Apnea     Review of Systems [name] [place] RMA; 12/28/2018 2:46 PM) General Not Present- Appetite Loss, Chills, Fatigue, Fever, Night Sweats, Weight Gain and Weight Loss. Skin Not Present- Change in Wart/Mole, Dryness, Hives, Jaundice, New Lesions, Non-Healing  Wounds, Rash and Ulcer. HEENT Not Present- Earache, Hearing Loss, Hoarseness, Nose Bleed, Oral Ulcers, Ringing in the Ears, Seasonal Allergies, Sinus Pain, Sore Throat, Visual Disturbances, Wears glasses/contact lenses and Yellow Eyes. Respiratory Not Present- Bloody sputum, Chronic Cough, Difficulty Breathing, Snoring and Wheezing. Breast Not Present- Breast Mass, Breast Pain, Nipple Discharge and Skin Changes. Cardiovascular Present- Rapid Heart Rate. Not Present- Chest Pain, Difficulty Breathing Lying Down, Leg Cramps, Palpitations, Shortness of Breath and Swelling of Extremities. Gastrointestinal Not Present- Abdominal Pain, Bloating, Bloody Stool, Change in Bowel Habits, Chronic diarrhea, Constipation, Difficulty Swallowing, Excessive gas, Gets full quickly at meals, Hemorrhoids, Indigestion, Nausea, Rectal Pain and Vomiting. Male Genitourinary Not Present- Blood in Urine, Change in Urinary Stream, Frequency, Impotence, Nocturia, Painful Urination, Urgency and Urine Leakage.  Vitals [name] [place] RMA; 12/28/2018 2:48 PM) 12/28/2018 2:48 PM Weight: 181.6 lb Height: 76in Body Surface Area: 2.13 m Body Mass Index: 22.1 kg/m  Temp.: 97.15F(Temporal)  Pulse: 68 (Regular)  P.OX: 99% (Room air) BP: 122/80 (Sitting, Left Arm, Standard)      Physical Exam Rodman Key K. Rubye Strohmeyer MD; 12/28/2018 3:14 PM)  The physical exam findings are as follows: Note:WDWN in NAD Eyes: Pupils equal, round; sclera anicteric HENT: Oral mucosa moist; good dentition Neck: No masses palpated, no thyromegaly Lungs: CTA bilaterally; normal respiratory effort CV: Regular rate and rhythm; no murmurs; extremities well-perfused with no edema Abd: +bowel sounds, soft, non-tender, no palpable organomegaly; no palpable hernias GU: bilateral descended testes; no testicular masses; small palpable right inguinal hernia; moderate-sized left inguinal hernia reducible Skin: Warm, dry; no sign of  jaundice Psychiatric - alert and oriented x 4; calm mood and affect    Assessment & Plan Rodman Key K. Chrystine Frogge MD; 12/28/2018 3:09 PM)  BILATERAL INGUINAL HERNIA WITHOUT OBSTRUCTION OR GANGRENE (K40.20)  Current Plans Schedule for Surgery - Open bilateral inguinal hernia repairs with  mesh. The surgical procedure has been discussed with the patient. Potential risks, benefits, alternative treatments, and expected outcomes have been explained. All of the patient's questions at this time have been answered. The likelihood of reaching the patient's treatment goal is good. The patient understand the proposed surgical procedure and wishes to proceed.  Ryan Woodward. Georgette Dover, MD, [facility]  General/ Trauma Surgery Beeper [phone]  12/28/2018 3:15 PM

## 2018-12-29 ENCOUNTER — Encounter: Payer: Self-pay | Admitting: Psychology

## 2018-12-29 ENCOUNTER — Encounter: Payer: Medicare Other | Attending: Psychology | Admitting: Psychology

## 2018-12-29 ENCOUNTER — Other Ambulatory Visit: Payer: Self-pay

## 2018-12-29 DIAGNOSIS — R413 Other amnesia: Secondary | ICD-10-CM

## 2018-12-29 NOTE — Progress Notes (Addendum)
The patient arrived 30 minutes early for his 13:00 testing appointment. The evaluation lasted 180 minutes.  Mental Status & Behavioral Observations:  Appearance: Casually and appropriately dressed and groomed.  Gait: Ambulated independently, somewhat wide-based with rigid posture. Speech: Clear, normal rate, low tone & volume; Mild word finding difficulties.  Thought process: Logical & linear  Affect: Mildly restricted but appropriate  Interpersonal: Reserved but appropriate  Orientation: Oriented x 4 Effort/Motivation: Good   He did not any difficulty hearing or understanding test instructions and did not require much additional prompting. He exhibited good frustration tolerance on questions he did not know or tasks that were more difficult and persisted well overall (e.g. did not require many breaks).   Tests Administered:  Finger Tapping Test   Grooved Pegboard   Hand Dynamometer   Wechsler Adult Intelligence Scale-Fourth Edition (WAIS-IV)  Wechsler Memory Scale-Fourth Edition (WMS-IV) Older Adult Version (ages 62-99)  Results of WAIS-IV:  Composite Score Summary  Scale Sum of Scaled Scores Composite Score Percentile Rank 95% Conf. Interval Qualitative Description  Verbal Comprehension 32 VCI 103 58 97-109 Average  Perceptual Reasoning 27 PRI 94 34 88-101 Average  Working Memory 22 WMI 105 63 98-111 Average  Processing Speed 16 PSI 89 23 82-98 Low Average  Full Scale 97 FSIQ 98 45 94-102 Average  General Ability 59 GAI 99 47 94-104 Average   Index Level Discrepancy Comparisons  Comparison Score 1 Score 2 Difference Critical Value .05 Significant Difference Y/N Base Rate by Overall Sample  VCI - PRI 103 94 9 8.31 Y 25.9  VCI - WMI 103 105 -2 9.30 N 45.4  VCI - PSI 103 89 14 10.19 Y 19.0  PRI - WMI 94 105 -11 10.18 Y 20.3  PRI - PSI 94 89 5 11.00 N 37.0  WMI - PSI 105 89 16 11.76 Y 14.9  FSIQ - GAI 98 99 -1 3.61 N 44.9   Differences Between Subtest and Overall  Mean of Subtest Scores  Subtest Subtest Scaled Score Mean Scaled Score Difference Critical Value .05 Strength or Weakness Base Rate  Block Design 8 9.70 -1.70 2.85  >25%  Similarities 9 9.70 -0.70 2.82  >25%  Digit Span 10 9.70 0.30 2.22  >25%  Matrix Reasoning 12 9.70 2.30 2.54  >25%  Vocabulary 12 9.70 2.30 2.03 S 15-25%  Arithmetic 12 9.70 2.30 2.73  15-25%  Symbol Search 7 9.70 -2.70 3.42  25%  Visual Puzzles 7 9.70 -2.70 2.71  15-25%  Information 11 9.70 1.30 2.19  >25%  Coding 9 9.70 -0.70 2.97  >25%        Results of WMS-IV (Older Adult Battery):  Brief Cognitive Status Exam Classification  Age Years of Education Raw Score Classification Level Base Rate  73 years 0 months 18 47 Low Average 26.3    Index Score Summary  Index Sum of Scaled Scores Index Score Percentile Rank 95% Confidence Interval Qualitative Descriptor  Auditory Memory (AMI) 34 91 27 85-98 Average  Visual Memory (VMI) 9 69 2 65-75 Extremely Low  Immediate Memory (IMI) 21 81 10 76-88 Low Average  Delayed Memory (DMI) 22 83 13 77-92 Low Average   Primary Subtest Scaled Score Summary  Subtest Domain Raw Score Scaled Score Percentile Rank  Logical Memory I AM 27 8 25   Logical Memory II AM 10 7 16   Verbal Paired Associates I AM 17 9 37  Verbal Paired Associates II AM 6 10 50  Visual Reproduction I VM 18 4 2  Visual Reproduction II VM 4 5 5   Symbol Span VWM 8 6 9    Auditory Memory Process Score Summary  Process Score Raw Score Scaled Score Percentile Rank Cumulative Percentage (Base Rate)  LM II Recognition 17 - - 26-50%  VPA II Recognition 27 - - 26-50%   Visual Memory Process Score Summary  Process Score Raw Score Scaled Score Percentile Rank Cumulative Percentage (Base Rate)  VR II Recognition 2 - - 10-16%   Results of Grooved Pegboard:  Marland Kitchen Dominant Hand (Time=136", T=32, 4%) . Non-Dominant Hand (Time=138", T=33, 5%)  Results of Hand Dynamometer: Marland Kitchen Dominant Hand (31.6 Kg,  WNL) . Non-Dominant Hand (30.6 Kg, WNL)   Results of Finger Tapping Test: . Dominant Hand (Average in 5 trials=58, T=65, 93%) . Non-Dominant Hand (Average in 5 trials=53, T=63, 91

## 2019-01-11 ENCOUNTER — Encounter: Payer: Medicare Other | Attending: Psychology | Admitting: Psychology

## 2019-01-11 ENCOUNTER — Other Ambulatory Visit: Payer: Self-pay

## 2019-01-11 ENCOUNTER — Encounter: Payer: Self-pay | Admitting: Psychology

## 2019-01-11 DIAGNOSIS — R413 Other amnesia: Secondary | ICD-10-CM | POA: Diagnosis not present

## 2019-01-11 DIAGNOSIS — G20 Parkinson's disease: Secondary | ICD-10-CM | POA: Diagnosis not present

## 2019-01-11 NOTE — Progress Notes (Signed)
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 Score Raw Score Scaled Score Percentile Rank Cumulative Percentage (Base Rate)  LM II Recognition 17 - - 26-50%  VPA II Recognition 27 - - 26-50%   Visual Memory Process Score Summary  Process Score Raw Score Scaled Score Percentile Rank Cumulative Percentage (Base Rate)  VR II Recognition 2 - - 10-16%    SUBTEST-LEVEL DIFFERENCES WITHIN INDEXES  Auditory Memory Index  Subtest Scaled Score AMI Mean Score Difference from Mean Critical Value Base Rate  Logical Memory I 8 8.50 -0.50 2.45 >25%  Logical Memory II 7 8.50 -1.50 2.38 >25%  Verbal Paired Associates I 9 8.50 0.50 2.04 >25%  Verbal Paired Associates II 10 8.50 1.50 3.06 >25%  Statistical significance (critical value) at the .05 level.   Immediate Memory Index  Subtest Scaled Score IMI Mean Score Difference from Mean Critical Value Base Rate  Logical Memory  I 8 7.00 1.00 2.01 >25%  Verbal Paired Associates I 9 7.00 2.00 1.71 25%  Visual Reproduction I 4 7.00 -3.00 1.71 10-15%  Statistical significance (critical value) at the .05 level.   Delayed Memory Index  Subtest Scaled Score DMI Mean Score Difference from Mean Critical Value Base Rate  Logical Memory II 7 7.33 -0.33 2.15 >25%  Verbal Paired Associates II 10 7.33 2.67 2.63 15%  Visual Reproduction II 5 7.33 -2.33 1.77 25%  Statistical significance (critical value) at the .05 level.    SUBTEST DISCREPANCY COMPARISON  Comparison Score 1 Score 2 Difference Critical Value Base Rate  Visual Reproduction I - Visual Reproduction II 4 5 -1 1.99 81.0  Statistical significance (critical value) at the .05 level.    SUBTEST-LEVEL CONTRAST SCALED SCORES  Logical Memory  Score Score 1 Score 2 Contrast Scaled Score  LM II Recognition vs. Delayed Recall 26-50% 7 8  LM Immediate Recall vs. Delayed Recall 8 7 7    Verbal Paired Associates  Score Score 1 Score 2 Contrast Scaled Score  VPA II Recognition vs. Delayed Recall 26-50% 10 10  VPA Immediate Recall vs. Delayed Recall 9 10 12    Visual Reproduction  Score Score 1 Score 2 Contrast Scaled Score  VR II Recognition vs. Delayed Recall 10-16% 5 7  VR Immediate Recall vs. Delayed Recall 4 5 8     INDEX-LEVEL CONTRAST SCALED SCORES  WMS-IV Indexes  Score Score 1 Score 2 Contrast Scaled Score  Auditory Memory Index vs. Visual Memory Index 91 69 3  Immediate Memory Index vs. Delayed Memory Index 81 83 9    RAW SCORES  Subtest Score Range Adult Score Range Older Adult Raw Score  Brief Cognitive Status Exam 0-58 0-58 47  Logical Memory I 0-50 0-53 27  Logical Memory II 0-50 0-39 10  Verbal Paired Associates I 0-56 0-40 17  Verbal Paired Associates II 0-14 0-10 6  CVLT-II Trials 1-5 5-95 5-95   CVLT-II Long-Delay -5-5 -5-5   Designs I 0-120    Designs II 0-120    Visual Reproduction I 0-43 0-43 18  Visual Reproduction II 0-43 0-43  4  Spatial Addition 0-24    Symbol Span 0-50 0-50 8  Process Score Range Adult Score Range Older Adult Raw Score  LM II Recognition 0-30 0-23 17  VPA II Recognition 0-40 0-30 27  VPA II Word Recall 0-28 0-20   DE I Content 0-48    DE I Spatial 0-24    DE II Content 0-48    DE II Spatial 0-24    DE II Recognition 0-24    VR II Recognition 0-7 0-7 2  VR II Copy 0-43 0-43     ABILITY-MEMORY ANALYSIS  Ability Score:  GAI: 99 Date of Testing:  WAIS-IV; WMS-IV 2018/12/29  Predicted Difference Method   Index Predicted WMS-IV Index Score Actual WMS-IV Index Score Difference Critical Value  Significant Difference Y/N Base Rate  Auditory Memory 99 91 8 10.41 N   Visual Memory 99 69 30 7.89 Y <1%  Immediate Memory 99 81 18 9.97 Y 5-10%  Delayed Memory 99 83 16 12.33 Y 10-15%  Statistical significance (critical value) at the .01 level.   Contrast Scaled Scores  Score Score 1 Score 2 Contrast Scaled Score  General Ability Index vs. Auditory Memory Index 99 91 8  General Ability Index vs. Visual Memory Index 99 69 3  General Ability Index vs. Immediate Memory Index 99 81 5  General Ability Index vs. Delayed Memory Index 99 83 7  Verbal Comprehension Index vs. Auditory Memory Index 103 91 8  Perceptual Reasoning Index vs. Visual Memory Index 94 69 3  Working Memory Index vs. Auditory Memory Index 105 91 8    End of Report

## 2019-01-15 ENCOUNTER — Telehealth: Payer: Self-pay | Admitting: Emergency Medicine

## 2019-01-15 ENCOUNTER — Telehealth: Payer: Self-pay | Admitting: Cardiology

## 2019-01-15 NOTE — Telephone Encounter (Signed)
Ryan Woodward called back, informed her that he has appointment on 01/17/2019 and we should have more information regarding the patient being cleared or not then. She verbally understood.

## 2019-01-15 NOTE — Telephone Encounter (Signed)
Virtual Visit Pre-Appointment Phone Call  "(Name), I am calling you today to discuss your upcoming appointment. We are currently trying to limit exposure to the virus that causes COVID-19 by seeing patients at home rather than in the office."  1. "What is the BEST phone number to call the day of the visit?" - include this in appointment notes  2. Do you have or have access to (through a family member/friend) a smartphone with video capability that we can use for your visit?" a. If yes - list this number in appt notes as cell (if different from BEST phone #) and list the appointment type as a VIDEO visit in appointment notes b. If no - list the appointment type as a PHONE visit in appointment notes  3. Confirm consent - "In the setting of the current Covid19 crisis, you are scheduled for a (phone or video) visit with your provider on (date) at (time).  Just as we do with many in-office visits, in order for you to participate in this visit, we must obtain consent.  If you'd like, I can send this to your mychart (if signed up) or email for you to review.  Otherwise, I can obtain your verbal consent now.  All virtual visits are billed to your insurance company just like a normal visit would be.  By agreeing to a virtual visit, we'd like you to understand that the technology does not allow for your provider to perform an examination, and thus may limit your provider's ability to fully assess your condition. If your provider identifies any concerns that need to be evaluated in person, we will make arrangements to do so.  Finally, though the technology is pretty good, we cannot assure that it will always work on either your or our end, and in the setting of a video visit, we may have to convert it to a phone-only visit.  In either situation, we cannot ensure that we have a secure connection.  Are you willing to proceed?" STAFF: Did the patient verbally acknowledge consent to telehealth visit? Document  YES/NO here: YES 4.   5. Advise patient to be prepared - "Two hours prior to your appointment, go ahead and check your blood pressure, pulse, oxygen saturation, and your weight (if you have the equipment to check those) and write them all down. When your visit starts, your provider will ask you for this information. If you have an Apple Watch or Kardia device, please plan to have heart rate information ready on the day of your appointment. Please have a pen and paper handy nearby the day of the visit as well."  6. Give patient instructions for MyChart download to smartphone OR [email or web address] as below if video visit (depending on what platform provider is using)  7. Inform patient they will receive a phone call 15 minutes prior to their appointment time (may be from unknown caller ID) so they should be prepared to answer    Ryan Woodward has been deemed a candidate for a follow-up tele-health visit to limit community exposure during the Covid-19 pandemic. I spoke with the patient via phone to ensure availability of phone/video source, confirm preferred email & phone number, and discuss instructions and expectations.  I reminded Ryan Woodward to be prepared with any vital sign and/or heart rhythm information that could potentially be obtained via home monitoring, at the time of his visit. I reminded Ryan Woodward to expect a phone call  prior to his visit.  Ryan Woodward 01/15/2019 10:13 AM   INSTRUCTIONS FOR DOWNLOADING THE MYCHART APP TO SMARTPHONE  - The patient must first make sure to have activated MyChart and know their login information - If Apple, go to [employer] and type in MyChart in the search bar and download the app. If Android, ask patient to go to [employer] and type in [place] in the search bar and download the app. The app is free but as with any other app downloads, their phone may require them to verify saved payment information or Apple/Android  password.  - The patient will need to then log into the app with their MyChart username and password, and select [facility] as their healthcare provider to link the account. When it is time for your visit, go to the MyChart app, find appointments, and click Begin Video Visit. Be sure to Select Allow for your device to access the Microphone and Camera for your visit. You will then be connected, and your provider will be with you shortly.  **If they have any issues connecting, or need assistance please contact MyChart service desk [phone])**  **If using a computer, in order to ensure the best quality for their visit they will need to use either of the following Internet Browsers: [employer], or Google Chrome**  IF USING DOXIMITY or DOXY.ME - The patient will receive a link just prior to their visit by text.     FULL LENGTH CONSENT FOR TELE-HEALTH VISIT   I hereby voluntarily request, consent and authorize [facility] and its employed or contracted physicians, physician assistants, nurse practitioners or other licensed health care professionals (the Practitioner), to provide me with telemedicine health care services (the Services") as deemed necessary by the treating Practitioner. I acknowledge and consent to receive the Services by the Practitioner via telemedicine. I understand that the telemedicine visit will involve communicating with the Practitioner through live audiovisual communication technology and the disclosure of certain medical information by electronic transmission. I acknowledge that I have been given the opportunity to request an in-person assessment or other available alternative prior to the telemedicine visit and am voluntarily participating in the telemedicine visit.  I understand that I have the right to withhold or withdraw my consent to the use of telemedicine in the course of my care at any time, without affecting my right to future care or treatment,  and that the Practitioner or I may terminate the telemedicine visit at any time. I understand that I have the right to inspect all information obtained and/or recorded in the course of the telemedicine visit and may receive copies of available information for a reasonable fee.  I understand that some of the potential risks of receiving the Services via telemedicine include:   Delay or interruption in medical evaluation due to technological equipment failure or disruption;  Information transmitted may not be sufficient (e.g. poor resolution of images) to allow for appropriate medical decision making by the Practitioner; and/or   In rare instances, security protocols could fail, causing a breach of personal health information.  Furthermore, I acknowledge that it is my responsibility to provide information about my medical history, conditions and care that is complete and accurate to the best of my ability. I acknowledge that Practitioner's advice, recommendations, and/or decision may be based on factors not within their control, such as incomplete or inaccurate data provided by me or distortions of diagnostic images or specimens that may result from electronic  transmissions. I understand that the practice of medicine is not an exact science and that Practitioner makes no warranties or guarantees regarding treatment outcomes. I acknowledge that I will receive a copy of this consent concurrently upon execution via email to the email address I last provided but may also request a printed copy by calling the office of [place].    I understand that my insurance will be billed for this visit.   I have read or had this consent read to me.  I understand the contents of this consent, which adequately explains the benefits and risks of the Services being provided via telemedicine.   I have been provided ample opportunity to ask questions regarding this consent and the Services and have had my questions  answered to my satisfaction.  I give my informed consent for the services to be provided through the use of telemedicine in my medical care  By participating in this telemedicine visit I agree to the above.

## 2019-01-15 NOTE — Telephone Encounter (Signed)
Left message for patient to return call to be scheduled for a televisit regarding clearance.   Attempted to call Kennyth Lose at [facility] to inform her of this. Will continue efforts.

## 2019-01-16 ENCOUNTER — Other Ambulatory Visit: Payer: Self-pay | Admitting: Neurology

## 2019-01-17 ENCOUNTER — Telehealth: Payer: Self-pay | Admitting: Emergency Medicine

## 2019-01-17 ENCOUNTER — Other Ambulatory Visit: Payer: Self-pay

## 2019-01-17 ENCOUNTER — Telehealth (INDEPENDENT_AMBULATORY_CARE_PROVIDER_SITE_OTHER): Payer: Medicare Other | Admitting: Cardiology

## 2019-01-17 ENCOUNTER — Encounter: Payer: Self-pay | Admitting: Cardiology

## 2019-01-17 VITALS — Wt 180.0 lb

## 2019-01-17 DIAGNOSIS — R002 Palpitations: Secondary | ICD-10-CM | POA: Diagnosis not present

## 2019-01-17 DIAGNOSIS — G20 Parkinson's disease: Secondary | ICD-10-CM | POA: Diagnosis not present

## 2019-01-17 DIAGNOSIS — I480 Paroxysmal atrial fibrillation: Secondary | ICD-10-CM

## 2019-01-17 DIAGNOSIS — G4733 Obstructive sleep apnea (adult) (pediatric): Secondary | ICD-10-CM

## 2019-01-17 DIAGNOSIS — Z01810 Encounter for preprocedural cardiovascular examination: Secondary | ICD-10-CM

## 2019-01-17 NOTE — Progress Notes (Signed)
Virtual Visit via Video Note   This visit type was conducted due to national recommendations for restrictions regarding the COVID-19 Pandemic (e.g. social distancing) in an effort to limit this patient's exposure and mitigate transmission in our community.  Due to his co-morbid illnesses, this patient is at least at moderate risk for complications without adequate follow up.  This format is felt to be most appropriate for this patient at this time.  All issues noted in this document were discussed and addressed.  A limited physical exam was performed with this format.  Please refer to the patient's chart for his consent to telehealth for [facility].  Evaluation Performed:  Follow-up visit  This visit type was conducted due to national recommendations for restrictions regarding the COVID-19 Pandemic (e.g. social distancing).  This format is felt to be most appropriate for this patient at this time.  All issues noted in this document were discussed and addressed.  No physical exam was performed (except for noted visual exam findings with Video Visits).  Please refer to the patient's chart (MyChart message for video visits and phone note for telephone visits) for the patient's consent to telehealth for [facility].  Date:  01/17/2019  ID: Ryan Woodward, DOB November 22, 1945, MRN [id number]   Patient Location: [place] [address]   Provider location:   [place] Office  PCP:  Lawerance Cruel, MD  Cardiologist:  Jenne Campus, MD     Chief Complaint: I need surgery  History of Present Illness:    Ryan Woodward is a 73 y.o. male  who presents via audio/video conferencing for a telehealth visit today.  Past medical history significant for paroxysmal atrial fibrillation.  So far one documented episode of atrial fibrillation that happened during the colonoscopy.  He also got obstructive sleep apnea.  No history of coronary artery disease.  Recently diagnosed  with Parkinson's disease.  He is having tele-visit with me to talk about his upcoming surgery.  He will required hernia surgery.  Overall he is doing very well he exercised on the regular basis he walks about 1.5 miles every single day.  He has no difficulty doing it he is walking involving also climbing so heels and he had no problem doing it.  He does have some shortness of breath when he climbs hills but no chest pain tightness squeezing pressure burning chest.  He also have 1 flight of stairs at home and he is able to climb it with no difficulties. Denies having a palpitation no tightness squeezing pressure burning chest   The patient does not have symptoms concerning for COVID-19 infection (fever, chills, cough, or new SHORTNESS OF BREATH).    Prior CV studies:   The following studies were reviewed today:  Echocardiogram done in September 22, 2018 showed:  1. The left ventricle has normal systolic function with an ejection fraction of 60-65%. The cavity size was normal. Left ventricular diastolic parameters were normal.  2. The right ventricle has normal systolic function. The cavity was normal. There is no increase in right ventricular wall thickness.  3. The mitral valve is normal in structure.  4. The tricuspid valve is normal in structure.  5. The aortic valve is tricuspid Aortic valve regurgitation is trivial by color flow Doppler.  6. The pulmonic valve was normal in structure. Pulmonic valve regurgitation is mild by color flow Doppler.  7. There is mild dilatation of the ascending aorta measuring 36 mm.  Past Medical History:  Diagnosis Date  . A-fib ([place])   . Allergies   . Back pain   . Bladder cancer ([place]) dx aug'12   s/p turbt  . Enlarged prostate with urinary obstruction    nocturia/ freq/ dysuria  . OSA on CPAP   . PD (Parkinson's disease) ([place])   . Skin cancer of nose 05/2018   basal cell    Past Surgical History:  Procedure Laterality Date  . CERVICAL  FUSION  2000   C4-5  . COLONOSCOPY    . CYSTOSCOPY  06/16/2011   Procedure: CYSTOSCOPY;  Surgeon: Molli Hazard, MD;  Location: [facility];  Service: Urology;  Laterality: N/A;  . CYSTOSCOPY WITH URETHRAL DILATATION  w/ TURBT BX   05-04-11  . SINUS SURGERY    . TRANSURETHRAL RESECTION OF PROSTATE  06/16/2011   Procedure: TRANSURETHRAL RESECTION OF THE PROSTATE (TURP);  Surgeon: Molli Hazard, MD;  Location[facility];  Service: Urology;  Laterality: N/A;  URETHRAL DILATATION, MEATOTOMY, PENIL BLOCK     Current Meds  Medication Sig  . aspirin EC 81 MG tablet Take 81 mg by mouth daily.  . carbidopa-levodopa (SINEMET IR) 25-100 MG tablet Take 1 tablet by mouth 3 (three) times daily. Take them 4-5 hours apart. For example 8am, 12 and 4pm or similar.  Do not eat protein within 30 minutes of taking tabs.  . clonazePAM (KLONOPIN) 0.5 MG tablet Take 1/2 to 1 pill at bedtime for sleep disorder (Patient taking differently: Take 0.5 mg by mouth at bedtime. Take 1/2 to 1 pill at bedtime for sleep disorder)  . fluticasone (FLONASE) 50 MCG/ACT nasal spray Place 2 sprays into the nose every morning.    . metoprolol succinate (TOPROL-XL) 25 MG 24 hr tablet Take 0.5 tablets (12.5 mg total) by mouth daily.  . Multiple Vitamins-Minerals (MULTIVITAMIN PO) Take by mouth.  . pantoprazole (PROTONIX) 40 MG tablet Take 40 mg by mouth daily as needed.  . Polyethylene Glycol 3350 (MIRALAX PO) Take by mouth daily.   . sildenafil (REVATIO) 20 MG tablet Take 20 mg by mouth 3 (three) times daily as needed.      Family History: The patient's family history includes Alzheimer's disease in his maternal uncle and mother; Stroke in his mother. There is no history of Parkinson's disease.   ROS:   Please see the history of present illness.     All other systems reviewed and are negative.   Labs/Other Tests and Data Reviewed:     Recent Labs: 05/23/2018: BUN 16;  Creatinine, Ser 0.92; Potassium 4.5; Sodium 146  Recent Lipid Panel No results found for: CHOL, TRIG, HDL, CHOLHDL, VLDL, LDLCALC, LDLDIRECT    Exam:    Vital Signs:  Wt 180 lb (81.6 kg)   BMI 21.91 kg/m     Wt Readings from Last 3 Encounters:  01/17/19 180 lb (81.6 kg)  09/20/18 183 lb 12.8 oz (83.4 kg)  08/25/18 186 lb (84.4 kg)     Well nourished, well developed in no acute distress. Alert awake oriented x3 talking to me under the video link not in any distress doing well overall  Diagnosis for this visit:   1. Paroxysmal atrial fibrillation (HCC)   2. Obstructive sleep apnea   3. Parkinson's disease ([place])   4. Palpitations   5. Preop cardiovascular exam      ASSESSMENT & PLAN:    1.  Paroxysmal atrial fibrillation only one documented episodes of atrial  fibrillation after colonoscopy.  He does have significant sleep apnea therefore I worry that maybe this episode of atrial fibrillation was triggered by sleep apnea during colonoscopy.  Since that time there was no more episodes of atrial fibrillation.  His chads 2 Vascor equals 1, therefore, he is not anticoagulated.  His last echocardiogram showed normal left atrial size.  We will continue present management 2.  Obstructive sleep apnea need to use CPAP mask and does not use it on the regular basis 3.  Parkinson's disease.  Stable on appropriate medications still very active. 4.  Palpitations denies having any 5.  Preop cardiovascular examination he is stable from my cardiac point of view to proceed with surgery as scheduled.  Because of good exercise tolerance there are no additional testing needed.  His echocardiogram showed preserved left ventricular ejection fraction.  I suspect the episode of atrial fibrillation that he had when he had his colonoscopy was related to sleep apnea.  Obviously did need to be taking care of around surgical time.  I will not alter any of his medication he is beta-blocker and very small dose can  be given the day of the surgery however if his heart rate is less than 45 I would hold that medication.  He does not have any dizziness or passing out.  COVID-19 Education: The signs and symptoms of COVID-19 were discussed with the patient and how to seek care for testing (follow up with PCP or arrange E-visit).  The importance of social distancing was discussed today.  Patient Risk:   After full review of this patients clinical status, I feel that they are at least moderate risk at this time.  Time:   Today, I have spent 18 minutes with the patient with telehealth technology discussing pt health issues.  I spent 5 minutes reviewing her chart before the visit.  Visit was finished at 12:05 PM.    Medication Adjustments/Labs and Tests Ordered: Current medicines are reviewed at length with the patient today.  Concerns regarding medicines are outlined above.  No orders of the defined types were placed in this encounter.  Medication changes: No orders of the defined types were placed in this encounter.    Disposition: Follow-up 5 months  Signed, Park Liter, MD, [facility] 01/17/2019 12:10 PM    [place]

## 2019-01-17 NOTE — Telephone Encounter (Signed)
Left message for patient to return call to be scheduled for 5 month fu appointment.

## 2019-01-17 NOTE — Patient Instructions (Signed)
Medication Instructions:  Your physician recommends that you continue on your current medications as directed. Please refer to the Current Medication list given to you today.  If you need a refill on your cardiac medications before your next appointment, please call your pharmacy.   Lab work: None.  If you have labs (blood work) drawn today and your tests are completely normal, you will receive your results only by: . MyChart Message (if you have MyChart) OR . A paper copy in the mail If you have any lab test that is abnormal or we need to change your treatment, we will call you to review the results.  Testing/Procedures: None.   Follow-Up: At [facility], you and your health needs are our priority.  As part of our continuing mission to provide you with exceptional heart care, we have created designated Provider Care Teams.  These Care Teams include your primary Cardiologist (physician) and Advanced Practice Providers (APPs -  Physician Assistants and Nurse Practitioners) who all work together to provide you with the care you need, when you need it. You will need a follow up appointment in 5 months.  Please call our office 2 months in advance to schedule this appointment.  You may see No primary care provider on file. or another member of our [facility] Provider Team in [place]: Brian Munley, MD . Rajan Revankar, MD  Any Other Special Instructions Will Be Listed Below (If Applicable).    

## 2019-01-31 ENCOUNTER — Encounter (HOSPITAL_BASED_OUTPATIENT_CLINIC_OR_DEPARTMENT_OTHER): Payer: Self-pay | Admitting: *Deleted

## 2019-01-31 ENCOUNTER — Other Ambulatory Visit: Payer: Self-pay

## 2019-02-01 ENCOUNTER — Encounter: Payer: Medicare Other | Attending: Psychology | Admitting: Psychology

## 2019-02-01 DIAGNOSIS — G20 Parkinson's disease: Secondary | ICD-10-CM | POA: Diagnosis not present

## 2019-02-01 DIAGNOSIS — R439 Unspecified disturbances of smell and taste: Secondary | ICD-10-CM | POA: Diagnosis not present

## 2019-02-01 DIAGNOSIS — R251 Tremor, unspecified: Secondary | ICD-10-CM | POA: Insufficient documentation

## 2019-02-01 DIAGNOSIS — R413 Other amnesia: Secondary | ICD-10-CM | POA: Diagnosis not present

## 2019-02-05 ENCOUNTER — Other Ambulatory Visit (HOSPITAL_COMMUNITY)
Admission: RE | Admit: 2019-02-05 | Discharge: 2019-02-05 | Disposition: A | Discharge status: Home / Self Care | Payer: Medicare Other | Source: Ambulatory Visit | Attending: Surgery | Admitting: Surgery

## 2019-02-05 DIAGNOSIS — Z01812 Encounter for preprocedural laboratory examination: Secondary | ICD-10-CM | POA: Diagnosis present

## 2019-02-05 DIAGNOSIS — Z1159 Encounter for screening for other viral diseases: Secondary | ICD-10-CM | POA: Insufficient documentation

## 2019-02-05 LAB — SARS CORONAVIRUS 2 (TAT 6-24 HRS): SARS Coronavirus 2: NEGATIVE

## 2019-02-05 NOTE — Progress Notes (Signed)
Ensure pre surgery drink given with instructions to complete by [place], pt verbalized understanding.

## 2019-02-06 ENCOUNTER — Encounter: Payer: Self-pay | Admitting: Psychology

## 2019-02-06 NOTE — Progress Notes (Signed)
Today I provided feedback to the patient and his husband regarding the results of the recent neuropsychological evaluation.  This feedback was conducted in my office and was an in person visit that lasted 1 hour.  Below is the summary and interpretations of the recent full neuropsychological evaluation that can be found in his complete form on the visit dated 01/11/2019.  We went over recommendations and results of the testing and diagnostic considerations.   Summary of Results:                                    Overall, the results of the current neuropsychological evaluation are consistent with some mild to moderate decreases in overall global cognitive performance.  There does appear to be a general decrease in the patient's global functioning.  However, there was a difference between visual-based versus verbal-based measures with the patient doing generally well with regard to his auditory encoding abilities, social judgment and comprehension and other verbal-based skills.  The patient had difficulty with visual scanning and visual searching task and even greater difficulty with regard to visual spatial and visual analysis.  His verbal and visual reasoning abilities were both generally efficient and within normal limits.  The patient also showed memory deficits consistent with his complaints of difficulties in these areas.  However, there was a significant difference between visual versus auditory memory functions with visual memory being more impaired than auditory memory.  Both visual and auditory memory were below predicted levels but visual memory deficits were much more significant.  This pattern would suggest greater frontal and parietal involvement versus temporal lobe involvement.  This pattern of visual deficits greater than auditory deficits was also consistent with patterns seen on the Wechsler Adult Intelligence Scale measures.  The patient showed deficits with regard to fine motor control  measures as well but no deficits with regard to overall motor strength or motor speed performance measures.  Impression/Diagnosis:                                 Overall, the results of the current neuropsychological evaluation are very consistent with patterns typically seen with Parkinson's disease.  The patient shows some significant fine motor deficits while overall gross motor strength is within normal limits.  There is not a difference seen related to laterality and both his dominant nondominant hands were generally consistent with each other.  The patient does show an overall decrease in what would be expected for his global cognitive functioning in general but his verbal skills and verbal judgment and reasoning skills were in continue to be generally well-preserved as well as his auditory encoding abilities.  The patient's primary and more most significant cognitive deficits had to do with visual spatial skills of visual reasoning and problem-solving were generally well preserved.  The patient showed difficulties with visual scanning and visual searching as well and overall speed of mental operations.  The patient's auditory memory while lower than predicted levels based on his age and education were better preserved than the patient's visual memory functions and visual-spatial functions this would suggest more frontal lobe and parietal lobe involvement then temporal lobe involvement and would also be consistent with Parkinson's types of cognitive changes.  Clinical observations do show more stiff gait as well.  The patient also describe during the clinical interview a significant change in  his sense of smell without a loss and functions of taste related to salt, bitter, sweet etc.  Tremor, changes in gait, loss of smell are all early symptoms of parkinsonian type conditions.  The patient is having some cognitive deficits related to word finding and verbal fluency, visual-spatial deficits, overall speed  of mental operations and visual scanning/searching abilities, visual encoding greater than auditory encoding deficits and visual memory deficits greater than auditory memory deficits.                                         Ilean Skill, Psy.D. Neuropsychologist

## 2019-02-07 ENCOUNTER — Ambulatory Visit (HOSPITAL_BASED_OUTPATIENT_CLINIC_OR_DEPARTMENT_OTHER): Payer: Medicare Other | Admitting: Anesthesiology

## 2019-02-07 ENCOUNTER — Encounter (HOSPITAL_BASED_OUTPATIENT_CLINIC_OR_DEPARTMENT_OTHER): Payer: Self-pay | Admitting: *Deleted

## 2019-02-07 ENCOUNTER — Ambulatory Visit (HOSPITAL_BASED_OUTPATIENT_CLINIC_OR_DEPARTMENT_OTHER)
Admission: RE | Admit: 2019-02-07 | Discharge: 2019-02-07 | Disposition: A | Discharge status: Home / Self Care | Payer: Medicare Other | Attending: Surgery | Admitting: Surgery

## 2019-02-07 ENCOUNTER — Encounter (HOSPITAL_BASED_OUTPATIENT_CLINIC_OR_DEPARTMENT_OTHER)
Admission: RE | Disposition: A | Discharge status: Home / Self Care | Payer: Self-pay | Source: Home / Self Care | Attending: Surgery

## 2019-02-07 DIAGNOSIS — N400 Enlarged prostate without lower urinary tract symptoms: Secondary | ICD-10-CM | POA: Diagnosis not present

## 2019-02-07 DIAGNOSIS — G20 Parkinson's disease: Secondary | ICD-10-CM | POA: Diagnosis not present

## 2019-02-07 DIAGNOSIS — Z87891 Personal history of nicotine dependence: Secondary | ICD-10-CM | POA: Diagnosis not present

## 2019-02-07 DIAGNOSIS — I4891 Unspecified atrial fibrillation: Secondary | ICD-10-CM | POA: Diagnosis not present

## 2019-02-07 DIAGNOSIS — Z7982 Long term (current) use of aspirin: Secondary | ICD-10-CM | POA: Diagnosis not present

## 2019-02-07 DIAGNOSIS — K4020 Bilateral inguinal hernia, without obstruction or gangrene, not specified as recurrent: Secondary | ICD-10-CM | POA: Diagnosis not present

## 2019-02-07 DIAGNOSIS — Z79899 Other long term (current) drug therapy: Secondary | ICD-10-CM | POA: Insufficient documentation

## 2019-02-07 DIAGNOSIS — G4733 Obstructive sleep apnea (adult) (pediatric): Secondary | ICD-10-CM | POA: Insufficient documentation

## 2019-02-07 HISTORY — PX: INGUINAL HERNIA REPAIR: SHX194

## 2019-02-07 HISTORY — DX: Gastro-esophageal reflux disease without esophagitis: K21.9

## 2019-02-07 HISTORY — PX: INSERTION OF MESH: SHX5868

## 2019-02-07 SURGERY — REPAIR, HERNIA, INGUINAL, ADULT
Anesthesia: General | Site: Groin | Laterality: Bilateral

## 2019-02-07 MED ORDER — DEXAMETHASONE SODIUM PHOSPHATE 4 MG/ML IJ SOLN
INTRAMUSCULAR | Status: DC | PRN
  Administered 2019-02-07: 10 mg via INTRAVENOUS

## 2019-02-07 MED ORDER — BUPIVACAINE-EPINEPHRINE 0.25% -1:200000 IJ SOLN
INTRAMUSCULAR | Status: DC | PRN
  Administered 2019-02-07: 20 mL

## 2019-02-07 MED ORDER — CHLORHEXIDINE GLUCONATE CLOTH 2 % EX PADS: 6.0000 | MEDICATED_PAD | Freq: Once | CUTANEOUS | Status: DC

## 2019-02-07 MED ORDER — MEPERIDINE HCL 25 MG/ML IJ SOLN: 6.2500 mg | INTRAMUSCULAR | Status: DC | PRN

## 2019-02-07 MED ORDER — ACETAMINOPHEN 500 MG PO TABS
1000.0000 mg | ORAL_TABLET | ORAL | Status: AC
  Administered 2019-02-07: 1000 mg via ORAL

## 2019-02-07 MED ORDER — OXYCODONE HCL 5 MG PO TABS
5.0000 mg | ORAL_TABLET | Freq: Once | ORAL | Status: AC
  Administered 2019-02-07: 5 mg via ORAL

## 2019-02-07 MED ORDER — GABAPENTIN 300 MG PO CAPS
300.0000 mg | ORAL_CAPSULE | ORAL | Status: AC
  Administered 2019-02-07: 300 mg via ORAL

## 2019-02-07 MED ORDER — CEFAZOLIN SODIUM-DEXTROSE 2-4 GM/100ML-% IV SOLN
INTRAVENOUS | Status: AC
  Filled 2019-02-07: qty 100

## 2019-02-07 MED ORDER — LIDOCAINE 2% (20 MG/ML) 5 ML SYRINGE
INTRAMUSCULAR | Status: DC | PRN
  Administered 2019-02-07: 60 mg via INTRAVENOUS

## 2019-02-07 MED ORDER — LIDOCAINE 2% (20 MG/ML) 5 ML SYRINGE
INTRAMUSCULAR | Status: AC
  Filled 2019-02-07: qty 5

## 2019-02-07 MED ORDER — FENTANYL CITRATE (PF) 100 MCG/2ML IJ SOLN
INTRAMUSCULAR | Status: AC
  Filled 2019-02-07: qty 2

## 2019-02-07 MED ORDER — GLYCOPYRROLATE PF 0.2 MG/ML IJ SOSY
PREFILLED_SYRINGE | INTRAMUSCULAR | Status: AC
  Filled 2019-02-07: qty 1

## 2019-02-07 MED ORDER — SCOPOLAMINE 1 MG/3DAYS TD PT72: 1.0000 | MEDICATED_PATCH | Freq: Once | TRANSDERMAL | Status: DC

## 2019-02-07 MED ORDER — ACETAMINOPHEN 500 MG PO TABS
ORAL_TABLET | ORAL | Status: AC
  Filled 2019-02-07: qty 2

## 2019-02-07 MED ORDER — MIDAZOLAM HCL 2 MG/2ML IJ SOLN
INTRAMUSCULAR | Status: AC
  Filled 2019-02-07: qty 2

## 2019-02-07 MED ORDER — FENTANYL CITRATE (PF) 100 MCG/2ML IJ SOLN
50.0000 ug | INTRAMUSCULAR | Status: DC | PRN
  Administered 2019-02-07: 100 ug via INTRAVENOUS

## 2019-02-07 MED ORDER — ONDANSETRON HCL 4 MG/2ML IJ SOLN
INTRAMUSCULAR | Status: DC | PRN
  Administered 2019-02-07: 4 mg via INTRAVENOUS

## 2019-02-07 MED ORDER — OXYCODONE HCL 5 MG PO TABS
ORAL_TABLET | ORAL | Status: AC
  Filled 2019-02-07: qty 1

## 2019-02-07 MED ORDER — LACTATED RINGERS IV SOLN
INTRAVENOUS | Status: DC
  Administered 2019-02-07 (×2): via INTRAVENOUS

## 2019-02-07 MED ORDER — CEFAZOLIN SODIUM-DEXTROSE 2-4 GM/100ML-% IV SOLN
2.0000 g | INTRAVENOUS | Status: AC
  Administered 2019-02-07: 2 g via INTRAVENOUS

## 2019-02-07 MED ORDER — GABAPENTIN 300 MG PO CAPS
ORAL_CAPSULE | ORAL | Status: AC
  Filled 2019-02-07: qty 1

## 2019-02-07 MED ORDER — PHENYLEPHRINE 40 MCG/ML (10ML) SYRINGE FOR IV PUSH (FOR BLOOD PRESSURE SUPPORT)
PREFILLED_SYRINGE | INTRAVENOUS | Status: AC
  Filled 2019-02-07: qty 10

## 2019-02-07 MED ORDER — MIDAZOLAM HCL 2 MG/2ML IJ SOLN
1.0000 mg | INTRAMUSCULAR | Status: DC | PRN
  Administered 2019-02-07: 1 mg via INTRAVENOUS

## 2019-02-07 MED ORDER — HYDROCODONE-ACETAMINOPHEN 5-325 MG PO TABS: 1.0000 | ORAL_TABLET | Freq: Four times a day (QID) | ORAL | 0 refills | Status: DC | PRN

## 2019-02-07 MED ORDER — FENTANYL CITRATE (PF) 100 MCG/2ML IJ SOLN
25.0000 ug | INTRAMUSCULAR | Status: DC | PRN
  Administered 2019-02-07: 25 ug via INTRAVENOUS
  Administered 2019-02-07: 50 ug via INTRAVENOUS

## 2019-02-07 MED ORDER — BUPIVACAINE HCL (PF) 0.25 % IJ SOLN
INTRAMUSCULAR | Status: DC | PRN
  Administered 2019-02-07 (×2): 30 mL

## 2019-02-07 MED ORDER — EPHEDRINE SULFATE-NACL 50-0.9 MG/10ML-% IV SOSY
PREFILLED_SYRINGE | INTRAVENOUS | Status: DC | PRN
  Administered 2019-02-07: 10 mg via INTRAVENOUS

## 2019-02-07 MED ORDER — PROPOFOL 10 MG/ML IV BOLUS
INTRAVENOUS | Status: DC | PRN
  Administered 2019-02-07: 150 mg via INTRAVENOUS

## 2019-02-07 MED ORDER — METOCLOPRAMIDE HCL 5 MG/ML IJ SOLN: 10.0000 mg | Freq: Once | INTRAMUSCULAR | Status: DC | PRN

## 2019-02-07 SURGICAL SUPPLY — 59 items
APL PRP STRL LF DISP 70% ISPRP (MISCELLANEOUS) ×1
APL SKNCLS STERI-STRIP NONHPOA (GAUZE/BANDAGES/DRESSINGS) ×1
BENZOIN TINCTURE PRP APPL 2/3 (GAUZE/BANDAGES/DRESSINGS) ×3 IMPLANT
BLADE CLIPPER SURG (BLADE) ×2 IMPLANT
BLADE HEX COATED 2.75 (ELECTRODE) ×3 IMPLANT
BLADE SURG 15 STRL LF DISP TIS (BLADE) ×2 IMPLANT
BLADE SURG 15 STRL SS (BLADE) ×6
CANISTER SUCT 1200ML W/VALVE (MISCELLANEOUS) IMPLANT
CHLORAPREP W/TINT 26 (MISCELLANEOUS) ×3 IMPLANT
CLOSURE WOUND 1/2 X4 (GAUZE/BANDAGES/DRESSINGS) ×1
COVER BACK TABLE REUSABLE LG (DRAPES) ×3 IMPLANT
COVER MAYO STAND REUSABLE (DRAPES) ×3 IMPLANT
COVER WAND RF STERILE (DRAPES) IMPLANT
DECANTER SPIKE VIAL GLASS SM (MISCELLANEOUS) ×3 IMPLANT
DRAIN PENROSE 1/2X12 LTX STRL (WOUND CARE) ×3 IMPLANT
DRAPE LAPAROTOMY TRNSV 102X78 (DRAPES) ×3 IMPLANT
DRAPE UTILITY XL STRL (DRAPES) ×3 IMPLANT
DRSG TEGADERM 4X4.75 (GAUZE/BANDAGES/DRESSINGS) ×5 IMPLANT
ELECT REM PT RETURN 9FT ADLT (ELECTROSURGICAL) ×3
ELECTRODE REM PT RTRN 9FT ADLT (ELECTROSURGICAL) ×1 IMPLANT
GAUZE SPONGE 4X4 12PLY STRL LF (GAUZE/BANDAGES/DRESSINGS) ×5 IMPLANT
GLOVE BIO SURGEON STRL SZ 6.5 (GLOVE) ×1 IMPLANT
GLOVE BIO SURGEON STRL SZ7 (GLOVE) ×5 IMPLANT
GLOVE BIO SURGEONS STRL SZ 6.5 (GLOVE) ×1
GLOVE BIOGEL PI IND STRL 6.5 (GLOVE) IMPLANT
GLOVE BIOGEL PI IND STRL 7.5 (GLOVE) ×1 IMPLANT
GLOVE BIOGEL PI INDICATOR 6.5 (GLOVE) ×4
GLOVE BIOGEL PI INDICATOR 7.5 (GLOVE) ×4
GOWN STRL REUS W/ TWL LRG LVL3 (GOWN DISPOSABLE) ×2 IMPLANT
GOWN STRL REUS W/TWL LRG LVL3 (GOWN DISPOSABLE) ×9
MESH PARIETEX PROGRIP LEFT (Mesh General) ×2 IMPLANT
MESH PARIETEX PROGRIP RIGHT (Mesh General) ×2 IMPLANT
NDL HYPO 25X1 1.5 SAFETY (NEEDLE) ×1 IMPLANT
NEEDLE HYPO 25X1 1.5 SAFETY (NEEDLE) ×3 IMPLANT
NS IRRIG 1000ML POUR BTL (IV SOLUTION) ×2 IMPLANT
PACK BASIN DAY SURGERY FS (CUSTOM PROCEDURE TRAY) ×3 IMPLANT
PENCIL BUTTON HOLSTER BLD 10FT (ELECTRODE) ×3 IMPLANT
SLEEVE SCD COMPRESS KNEE MED (MISCELLANEOUS) ×3 IMPLANT
SPONGE INTESTINAL PEANUT (DISPOSABLE) ×3 IMPLANT
SPONGE LAP 18X18 RF (DISPOSABLE) IMPLANT
STRIP CLOSURE SKIN 1/2X4 (GAUZE/BANDAGES/DRESSINGS) ×2 IMPLANT
SUT MON AB 4-0 PC3 18 (SUTURE) ×5 IMPLANT
SUT PDS AB 0 CT 36 (SUTURE) IMPLANT
SUT SILK 2 0 SH (SUTURE) IMPLANT
SUT SILK 3 0 SH 30 (SUTURE) IMPLANT
SUT SILK 3 0 TIES 17X18 (SUTURE)
SUT SILK 3-0 18XBRD TIE BLK (SUTURE) IMPLANT
SUT VIC AB 0 CT1 27 (SUTURE) ×6
SUT VIC AB 0 CT1 27XBRD ANBCTR (SUTURE) ×1 IMPLANT
SUT VIC AB 0 SH 27 (SUTURE) IMPLANT
SUT VIC AB 2-0 SH 27 (SUTURE) ×6
SUT VIC AB 2-0 SH 27XBRD (SUTURE) ×1 IMPLANT
SUT VIC AB 3-0 SH 27 (SUTURE) ×6
SUT VIC AB 3-0 SH 27X BRD (SUTURE) ×1 IMPLANT
SYR CONTROL 10ML LL (SYRINGE) ×3 IMPLANT
TOWEL GREEN STERILE FF (TOWEL DISPOSABLE) ×3 IMPLANT
TUBE CONNECTING 20'X1/4 (TUBING)
TUBE CONNECTING 20X1/4 (TUBING) IMPLANT
YANKAUER SUCT BULB TIP NO VENT (SUCTIONS) IMPLANT

## 2019-02-07 NOTE — Transfer of Care (Signed)
Immediate Anesthesia Transfer of Care Note  Patient: Ryan Woodward  Procedure(s) Performed: BILATERAL INGUINAL HERNIA REPAIR WITH MESH (Bilateral Groin) INSERTION OF MESH (Bilateral Groin)  Patient Location: PACU  Anesthesia Type:General  Level of Consciousness: drowsy  Airway & Oxygen Therapy: Patient Spontanous Breathing and Patient connected to nasal cannula oxygen  Post-op Assessment: Report given to RN and Post -op Vital signs reviewed and stable  Post vital signs: Reviewed and stable  Last Vitals:  Vitals Value Taken Time  BP    Temp    Pulse 55 02/07/19 1353  Resp 11 02/07/19 1353  SpO2 100 % 02/07/19 1353  Vitals shown include unvalidated device data.  Last Pain:  Vitals:   02/07/19 1156  TempSrc: Oral  PainSc: 0-No pain         Complications: No apparent anesthesia complications

## 2019-02-07 NOTE — H&P (Signed)
History of Present Illness  The patient is a 73 year old male who presents with an inguinal hernia.  Referred by Dr. Lona Kettle  This is a 73 year old male with parkinsonism and obstructive sleep apnea requiring CPAP who presents with a five-week history of some discomfort in his left groin. He has developed some swelling in this area. He denies any obstructive symptoms. He denies any pain or swelling in his right groin. The patient has a history of atrial fibrillation and is followed by cardiology. His last visit was in February of this year. He does wear his CPAP mask every night.   Past Surgical History  Colon Polyp Removal - Colonoscopy Spinal Surgery - Neck Tonsillectomy TURP  Diagnostic Studies History  Colonoscopy 1-5 years ago  Allergies  No Known Drug Allergies Allergies Reconciled  Medication History Carbidopa-Levodopa (25-100MG  Tablet, Oral) Active. clonazePAM (0.5MG  Tablet, Oral) Active. Metoprolol Succinate ER (25MG  Tablet ER 24HR, Oral) Active. Aspirin (81MG  Tablet DR, Oral) Active. Fluticasone Propionate (50MCG/ACT Suspension, Nasal) Active. Multi Vitamin (Oral) Active. Pantoprazole Sodium (40MG  Tablet DR, Oral) Active. MiraLax (Oral) Active. Sildenafil Citrate (20MG  Tablet, Oral) Active. Medications Reconciled  Other Problems Atrial Fibrillation Bladder Problems Cancer Enlarged Prostate Sleep Apnea     Review of Systems General Not Present- Appetite Loss, Chills, Fatigue, Fever, Night Sweats, Weight Gain and Weight Loss. Skin Not Present- Change in Wart/Mole, Dryness, Hives, Jaundice, New Lesions, Non-Healing Wounds, Rash and Ulcer. HEENT Not Present- Earache, Hearing Loss, Hoarseness, Nose Bleed, Oral Ulcers, Ringing in the Ears, Seasonal Allergies, Sinus Pain, Sore Throat, Visual Disturbances, Wears glasses/contact lenses and Yellow Eyes. Respiratory Not Present- Bloody sputum, Chronic Cough, Difficulty  Breathing, Snoring and Wheezing. Breast Not Present- Breast Mass, Breast Pain, Nipple Discharge and Skin Changes. Cardiovascular Present- Rapid Heart Rate. Not Present- Chest Pain, Difficulty Breathing Lying Down, Leg Cramps, Palpitations, Shortness of Breath and Swelling of Extremities. Gastrointestinal Not Present- Abdominal Pain, Bloating, Bloody Stool, Change in Bowel Habits, Chronic diarrhea, Constipation, Difficulty Swallowing, Excessive gas, Gets full quickly at meals, Hemorrhoids, Indigestion, Nausea, Rectal Pain and Vomiting. Male Genitourinary Not Present- Blood in Urine, Change in Urinary Stream, Frequency, Impotence, Nocturia, Painful Urination, Urgency and Urine Leakage.  Vitals  Weight: 181.6 lb Height: 76in Body Surface Area: 2.13 m Body Mass Index: 22.1 kg/m  Temp.: 97.77F(Temporal)  Pulse: 68 (Regular)  P.OX: 99% (Room air) BP: 122/80 (Sitting, Left Arm, Standard)      Physical Exam   The physical exam findings are as follows: Note:WDWN in NAD Eyes: Pupils equal, round; sclera anicteric HENT: Oral mucosa moist; good dentition Neck: No masses palpated, no thyromegaly Lungs: CTA bilaterally; normal respiratory effort CV: Regular rate and rhythm; no murmurs; extremities well-perfused with no edema Abd: +bowel sounds, soft, non-tender, no palpable organomegaly; no palpable hernias GU: bilateral descended testes; no testicular masses; small palpable right inguinal hernia; moderate-sized left inguinal hernia reducible Skin: Warm, dry; no sign of jaundice Psychiatric - alert and oriented x 4; calm mood and affect    Assessment & Plan   BILATERAL INGUINAL HERNIA WITHOUT OBSTRUCTION OR GANGRENE (K40.20)  Current Plans Schedule for Surgery - Open bilateral inguinal hernia repairs with mesh. The surgical procedure has been discussed with the patient. Potential risks, benefits, alternative treatments, and expected outcomes have been explained.  All of the patient's questions at this time have been answered. The likelihood of reaching the patient's treatment goal is good. The patient understand the proposed surgical procedure and wishes to proceed.  Imogene Burn. Dawan Farney, MD, FACS  [facility]  General/ Trauma Surgery Beeper [phone]  02/07/2019 11:49 AM

## 2019-02-07 NOTE — Op Note (Signed)
Bilateral Inguinal Hernia repair with mesh, Open, Procedure Note  Indications: This is a 73 year old male with parkinsonism and obstructive sleep apnea requiring CPAP who presents with a five-week history of some discomfort in his left groin. He has developed some swelling in this area. He denies any obstructive symptoms. He denies any pain or swelling in his right groin. The patient has a history of atrial fibrillation and is followed by cardiology. His last visit was in February of this year. He does wear his CPAP mask every night.  He was examined and actually had bilateral inguinal hernias  Pre-operative Diagnosis: bilateral reducible inguinal hernia Post-operative Diagnosis: same  Surgeon: Maia Petties   Assistants: none  Anesthesia: General LMA anesthesia and TAP blocks  ASA Class: 2  Procedure Details  The patient was seen again in the Holding Room. The risks, benefits, complications, treatment options, and expected outcomes were discussed with the patient. The possibilities of reaction to medication, pulmonary aspiration, perforation of viscus, bleeding, recurrent infection, the need for additional procedures, and development of a complication requiring transfusion or further operation were discussed with the patient and/or family. The likelihood of success in repairing the hernia and returning the patient to their previous functional status is good.  There was concurrence with the proposed plan, and informed consent was obtained. The site of surgery was properly noted/marked. The patient was taken to the Operating Room, identified as MARCIA Woodward, and the procedure verified as bilateral inguinal hernia repair. A Time Out was held and the above information confirmed.  The patient was placed in the supine position and underwent induction of anesthesia. The lower abdomen and groin was prepped with Chloraprep and draped in the standard fashion, and 0.25% Marcaine with epinephrine was  used to anesthetize the skin over the mid-portion of the right inguinal canal. An oblique incision was made. Dissection was carried down through the subcutaneous tissue with cautery to the external oblique fascia.  We opened the external oblique fascia along the direction of its fibers to the external ring.  The spermatic cord was circumferentially dissected bluntly and retracted with a Penrose drain.  The ilioinguinal nerve was identified and preserved.  The floor of the inguinal canal was inspected and revealed a large direct defect.  We skeletonized the spermatic cord and did not find an indirect hernia.  The floor of the inguinal canal was closed with 0 Vicryl.  We used a right Progrip mesh which was inserted and deployed across the floor of the inguinal canal. The mesh was tucked underneath the external oblique fascia laterally.  The flap of the mesh was closed around the spermatic cord to recreate the internal inguinal ring.  The mesh was secured to the pubic tubercle with 0 Vicryl.  We also placed stay sutures in the inferior edge of the mesh to attach it to the shelving edge.  Another stay suture was used to close the mesh flap.  The external oblique fascia was reapproximated with 2-0 Vicryl.  3-0 Vicryl was used to close the subcutaneous tissues and 4-0 Monocryl was used to close the skin in subcuticular fashion.    We then turned our attention to the left side.  An oblique incision was made. Dissection was carried down through the subcutaneous tissue with cautery to the external oblique fascia.  We opened the external oblique fascia along the direction of its fibers to the external ring.  The spermatic cord was circumferentially dissected bluntly and retracted with a Penrose drain.  The ilioinguinal nerve was identified and preserved.  The floor of the inguinal canal was inspected and revealed another direct hernia defect.  .  We skeletonized the spermatic cord and did not find an indirect hernia.  The  floor of the inguinal canal was closed with 0 Vicryl.  We used a left Progrip mesh which was inserted and deployed across the floor of the inguinal canal. The mesh was tucked underneath the external oblique fascia laterally.  The flap of the mesh was closed around the spermatic cord to recreate the internal inguinal ring.  The mesh was secured to the pubic tubercle with 0 Vicryl.  We also placed stay sutures in the inferior edge of the mesh to attach it to the shelving edge.  Another stay suture was used to close the mesh flap. The external oblique fascia was reapproximated with 2-0 Vicryl.  3-0 Vicryl was used to close the subcutaneous tissues and 4-0 Monocryl was used to close the skin in subcuticular fashion    Benzoin and steri-strips were used to seal the incisions.  Clean dressings were applied.  The patient was then extubated and brought to the recovery room in stable condition.  All sponge, instrument, and needle counts were correct prior to closure and at the conclusion of the case.   Estimated Blood Loss: Minimal                 Complications: None; patient tolerated the procedure well.         Disposition: PACU - hemodynamically stable.         Condition: stable  Ryan Burn. Georgette Dover, MD, [facility]  General/ Trauma Surgery Beeper [phone]  02/07/2019 2:05 PM

## 2019-02-07 NOTE — Anesthesia Procedure Notes (Signed)
Anesthesia Regional Block: TAP block   Pre-Anesthetic Checklist: ,, timeout performed, Correct Patient, Correct Site, Correct Laterality, Correct Procedure, Correct Position, site marked, Risks and benefits discussed,  Surgical consent,  Pre-op evaluation,  At surgeon's request and post-op pain management  Laterality: Left  Prep: Maximum Sterile Barrier Precautions used, chloraprep       Needles:  Injection technique: Single-shot  Needle Type: Echogenic Stimulator Needle     Needle Length: 10cm      Additional Needles:   Procedures:,,,, ultrasound used (permanent image in chart),,,,  Narrative:  Start time: 02/07/2019 12:17 PM End time: 02/07/2019 12:22 PM Injection made incrementally with aspirations every 5 mL.  Performed by: Personally  Anesthesiologist: Montez Hageman, MD  Additional Notes: Risks, benefits and alternative to block explained extensively.  Patient tolerated procedure well, without complications.

## 2019-02-07 NOTE — Anesthesia Procedure Notes (Signed)
Anesthesia Regional Block: TAP block   Pre-Anesthetic Checklist: ,, timeout performed, Correct Patient, Correct Site, Correct Laterality, Correct Procedure, Correct Position, site marked, Risks and benefits discussed,  Surgical consent,  Pre-op evaluation,  At surgeon's request and post-op pain management  Laterality: Right  Prep: Maximum Sterile Barrier Precautions used, chloraprep       Needles:  Injection technique: Single-shot  Needle Type: Echogenic Stimulator Needle     Needle Length: 10cm      Additional Needles:   Procedures:,,,, ultrasound used (permanent image in chart),,,,  Narrative:  Start time: 02/07/2019 12:25 PM End time: 02/07/2019 12:30 PM Injection made incrementally with aspirations every 5 mL.  Performed by: Personally  Anesthesiologist: Montez Hageman, MD  Additional Notes: Risks, benefits and alternative to block explained extensively.  Patient tolerated procedure well, without complications.

## 2019-02-07 NOTE — Discharge Instructions (Addendum)
[facility] ° ° INGUINAL HERNIA REPAIR: POST OP INSTRUCTIONS ° °Always review your discharge instruction sheet given to you by the facility where your surgery was performed. °IF YOU HAVE DISABILITY OR FAMILY LEAVE FORMS, YOU MUST BRING THEM TO THE OFFICE FOR PROCESSING.   °DO NOT GIVE THEM TO YOUR DOCTOR. ° °1. A  prescription for pain medication may be given to you upon discharge.  Take your pain medication as prescribed, if needed.  If narcotic pain medicine is not needed, then you may take acetaminophen (Tylenol) or ibuprofen (Advil) as needed. °2. Take your usually prescribed medications unless otherwise directed. °3. If you need a refill on your pain medication, please contact your pharmacy.  They will contact our office to request authorization. Prescriptions will not be filled after 5 pm or on week-ends. °4. You should follow a light diet the first 24 hours after arrival home, such as soup and crackers, etc.  Be sure to include lots of fluids daily.  Resume your normal diet the day after surgery. °5. Most patients will experience some swelling and bruising around the umbilicus or in the groin and scrotum.  Ice packs and reclining will help.  Swelling and bruising can take several days to resolve.  °6. It is common to experience some constipation if taking pain medication after surgery.  Increasing fluid intake and taking a stool softener (such as Colace) will usually help or prevent this problem from occurring.  A mild laxative (Milk of Magnesia or Miralax) should be taken according to package directions if there are no bowel movements after 48 hours. °7. Unless discharge instructions indicate otherwise, you may remove your bandages 24-48 hours after surgery, and you may shower at that time.  You will have steri-strips (small skin tapes) in place directly over the incision.  These strips should be left on the skin for 7-10 days. °8. ACTIVITIES:  You may resume regular (light) daily activities  beginning the next day--such as daily self-care, walking, climbing stairs--gradually increasing activities as tolerated.  You may have sexual intercourse when it is comfortable.  Refrain from any heavy lifting or straining until approved by your doctor. °a. You may drive when you are no longer taking prescription pain medication, you can comfortably wear a seatbelt, and you can safely maneuver your car and apply brakes. °b. RETURN TO WORK:  2-3 weeks with light duty - no lifting over 15 lbs. °9. You should see your doctor in the office for a follow-up appointment approximately 2-3 weeks after your surgery.  Make sure that you call for this appointment within a day or two after you arrive home to insure a convenient appointment time. °10. OTHER INSTRUCTIONS:  __________________________________________________________________________________________________________________________________________________________________________________________  °WHEN TO CALL YOUR DOCTOR: °1. Fever over 101.0 °2. Inability to urinate °3. Nausea and/or vomiting °4. Extreme swelling or bruising °5. Continued bleeding from incision. °6. Increased pain, redness, or drainage from the incision ° °The clinic staff is available to answer your questions during regular business hours.  Please don’t hesitate to call and ask to speak to one of the nurses for clinical concerns.  If you have a medical emergency, go to the nearest emergency room or call 911.  A surgeon from [facility] is always on call at the hospital ° ° °[address], [address], [place], [place]  [address] ? ° [address] [address], [place],    [address] °[phone]    [phone]    FAX [phone] °Web site:   [email or web address]   Post Anesthesia Home Care Instructions  Activity: Get plenty of rest for the remainder of the day. A responsible individual must stay with you for 24 hours following the procedure.  For the next 24 hours, DO  NOT: -Drive a car -Paediatric nurse -Drink alcoholic beverages -Take any medication unless instructed by your physician -Make any legal decisions or sign important papers.  Meals: Start with liquid foods such as gelatin or soup. Progress to regular foods as tolerated. Avoid greasy, spicy, heavy foods. If nausea and/or vomiting occur, drink only clear liquids until the nausea and/or vomiting subsides. Call your physician if vomiting continues.  Special Instructions/Symptoms: Your throat may feel dry or sore from the anesthesia or the breathing tube placed in your throat during surgery. If this causes discomfort, gargle with warm salt water. The discomfort should disappear within 24 hours.  If you had a scopolamine patch placed behind your ear for the management of post- operative nausea and/or vomiting:  1. The medication in the patch is effective for 72 hours, after which it should be removed.  Wrap patch in a tissue and discard in the trash. Wash hands thoroughly with soap and water. 2. You may remove the patch earlier than 72 hours if you experience unpleasant side effects which may include dry mouth, dizziness or visual disturbances. 3. Avoid touching the patch. Wash your hands with soap and water after contact with the patch.

## 2019-02-07 NOTE — Progress Notes (Signed)
Assisted Dr. Marcell Barlow with right, left, transabdominal plane block. Side rails up, monitors on throughout procedure. See vital signs in flow sheet. Tolerated Procedure well.

## 2019-02-07 NOTE — Anesthesia Postprocedure Evaluation (Signed)
Anesthesia Post Note  Patient: Ryan Woodward  Procedure(s) Performed: BILATERAL INGUINAL HERNIA REPAIR WITH MESH (Bilateral Groin) INSERTION OF MESH (Bilateral Groin)     Patient location during evaluation: PACU Anesthesia Type: General Level of consciousness: awake and alert Pain management: pain level controlled Vital Signs Assessment: post-procedure vital signs reviewed and stable Respiratory status: spontaneous breathing, nonlabored ventilation, respiratory function stable and patient connected to nasal cannula oxygen Cardiovascular status: blood pressure returned to baseline and stable Postop Assessment: no apparent nausea or vomiting Anesthetic complications: no    Last Vitals:  Vitals:   02/07/19 1445 02/07/19 1458  BP:  (!) 170/90  Pulse: 66 61  Resp: 12 16  Temp:  36.6 C  SpO2: 97% 99%    Last Pain:  Vitals:   02/07/19 1458  TempSrc:   PainSc: 7                  Montez Hageman

## 2019-02-07 NOTE — Anesthesia Procedure Notes (Signed)
Procedure Name: LMA Insertion Date/Time: 02/07/2019 12:33 PM Performed by: Lieutenant Diego, CRNA Pre-anesthesia Checklist: Patient identified, Emergency Drugs available, Suction available and Patient being monitored Patient Re-evaluated:Patient Re-evaluated prior to induction Oxygen Delivery Method: Circle system utilized Preoxygenation: Pre-oxygenation with 100% oxygen Induction Type: IV induction Ventilation: Mask ventilation without difficulty LMA: LMA inserted LMA Size: 5.0 Number of attempts: 1 Placement Confirmation: positive ETCO2 and breath sounds checked- equal and bilateral Tube secured with: Tape Dental Injury: Teeth and Oropharynx as per pre-operative assessment

## 2019-02-07 NOTE — Anesthesia Preprocedure Evaluation (Signed)
Anesthesia Evaluation  Patient identified by MRN, date of birth, ID band Patient awake    Reviewed: Allergy & Precautions, NPO status , Patient's Chart, lab work & pertinent test results  Airway Mallampati: II  TM Distance: >3 FB Neck ROM: Full    Dental no notable dental hx.    Pulmonary sleep apnea , former smoker,    Pulmonary exam normal breath sounds clear to auscultation       Cardiovascular Normal cardiovascular exam+ dysrhythmias Atrial Fibrillation  Rhythm:Regular Rate:Normal     Neuro/Psych Parkinson's negative psych ROS   GI/Hepatic negative GI ROS, Neg liver ROS,   Endo/Other  negative endocrine ROS  Renal/GU negative Renal ROS  negative genitourinary   Musculoskeletal negative musculoskeletal ROS (+)   Abdominal   Peds negative pediatric ROS (+)  Hematology negative hematology ROS (+)   Anesthesia Other Findings   Reproductive/Obstetrics negative OB ROS                             Anesthesia Physical Anesthesia Plan  ASA: II  Anesthesia Plan: General   Post-op Pain Management:  Regional for Post-op pain   Induction: Intravenous  PONV Risk Score and Plan: 2 and Ondansetron, Midazolam and Treatment may vary due to age or medical condition  Airway Management Planned: Oral ETT and LMA  Additional Equipment:   Intra-op Plan:   Post-operative Plan: Extubation in OR  Informed Consent: I have reviewed the patients History and Physical, chart, labs and discussed the procedure including the risks, benefits and alternatives for the proposed anesthesia with the patient or authorized representative who has indicated his/her understanding and acceptance.     Dental advisory given  Plan Discussed with: CRNA  Anesthesia Plan Comments:         Anesthesia Quick Evaluation

## 2019-02-08 ENCOUNTER — Encounter (HOSPITAL_BASED_OUTPATIENT_CLINIC_OR_DEPARTMENT_OTHER): Payer: Self-pay | Admitting: Surgery

## 2019-03-06 ENCOUNTER — Ambulatory Visit: Payer: Medicare Other | Admitting: Cardiology

## 2019-03-28 ENCOUNTER — Other Ambulatory Visit: Payer: Self-pay | Admitting: *Deleted

## 2019-03-28 MED ORDER — CLONAZEPAM 0.5 MG PO TABS: 0.5000 mg | ORAL_TABLET | Freq: Every day | ORAL | 1 refills | Status: DC

## 2019-03-29 ENCOUNTER — Other Ambulatory Visit: Payer: Self-pay | Admitting: Neurology

## 2019-03-29 MED ORDER — CLONAZEPAM 0.5 MG PO TABS: 0.5000 mg | ORAL_TABLET | Freq: Every day | ORAL | 1 refills | Status: DC

## 2019-03-29 NOTE — Telephone Encounter (Signed)
Faxed signed Clonazepam prescription to pharmacy. Received a receipt of confirmation.

## 2019-05-16 ENCOUNTER — Telehealth: Payer: Self-pay | Admitting: Cardiology

## 2019-05-16 NOTE — Telephone Encounter (Signed)
Fine with me

## 2019-05-16 NOTE — Telephone Encounter (Signed)
Ok with me 

## 2019-05-16 NOTE — Telephone Encounter (Signed)
Patient would like to switch from Dr. Agustin Cree to Dr. Angelena Form. Are you both in agreement?

## 2019-07-04 ENCOUNTER — Ambulatory Visit: Payer: Medicare Other | Admitting: Adult Health

## 2019-07-04 ENCOUNTER — Encounter: Payer: Self-pay | Admitting: Adult Health

## 2019-07-04 ENCOUNTER — Telehealth: Payer: Self-pay | Admitting: *Deleted

## 2019-07-04 ENCOUNTER — Other Ambulatory Visit: Payer: Self-pay

## 2019-07-04 VITALS — BP 112/78 | HR 54 | Temp 97.4°F | Ht 76.0 in | Wt 184.0 lb

## 2019-07-04 DIAGNOSIS — Z9989 Dependence on other enabling machines and devices: Secondary | ICD-10-CM

## 2019-07-04 DIAGNOSIS — G20 Parkinson's disease: Secondary | ICD-10-CM

## 2019-07-04 DIAGNOSIS — G20C Parkinsonism, unspecified: Secondary | ICD-10-CM

## 2019-07-04 DIAGNOSIS — G4752 REM sleep behavior disorder: Secondary | ICD-10-CM | POA: Diagnosis not present

## 2019-07-04 DIAGNOSIS — G4733 Obstructive sleep apnea (adult) (pediatric): Secondary | ICD-10-CM | POA: Diagnosis not present

## 2019-07-04 NOTE — Telephone Encounter (Signed)
   [facility] Pre-operative Risk Assessment    Request for surgical clearance:  1. What type of surgery is being performed? CATARACT EXTRACTION W/INTROCULAR LENS IMPLANT OF THE RIGHT EYE FOLLOWED BY THE LEFT EYE   2. When is this surgery scheduled? 07/09/19   3. What type of clearance is required (medical clearance vs. Pharmacy clearance to hold med vs. Both)? MEDICAL  4. Are there any medications that need to be held prior to surgery and how long? NO MEDICATIONS ARE NEEDING TO BE HELD   5. Practice name and name of physician performing surgery? [facility];  DR. Christia Reading BEVIS, FAAO  6. What is your office phone number [phone]    7.   What is your office fax number [phone]  8.   Anesthesia type (None, local, MAC, general) ? TOPICAL W/IV MEDICATIONS   Julaine Hua 07/04/2019, 4:00 PM  _________________________________________________________________   (provider comments below)

## 2019-07-04 NOTE — Telephone Encounter (Signed)
   Primary Cardiologist: Lauree Chandler, MD  Chart reviewed as part of pre-operative protocol coverage. Cataract extractions are recognized in guidelines as low risk surgeries that do not typically require specific preoperative testing or holding of blood thinner therapy. Therefore, given past medical history and time since last visit, based on ACC/AHA guidelines, [place] would be at acceptable risk for the planned procedure without further cardiovascular testing.   I will route this recommendation to the requesting party via Epic fax function and remove from pre-op pool.  Please call with questions.  Daune Perch, NP 07/04/2019, 4:18 PM

## 2019-07-04 NOTE — Patient Instructions (Addendum)
Your Plan:  Continue using CPAP nightly and greater than 4 hours each night Continue Sinemet and Klonopin If your symptoms worsen or you develop new symptoms please let us know.    Thank you for coming to see [place] at [facility]. I hope we have been able to provide you high quality care today.  You may receive a patient satisfaction survey over the next few weeks. We would appreciate your feedback and comments so that we may continue to improve ourselves and the health of our patients.

## 2019-07-04 NOTE — Progress Notes (Addendum)
PATIENT: Ryan Woodward DOB: 11-May-1946  REASON FOR VISIT: follow up HISTORY FROM: patient  HISTORY OF PRESENT ILLNESS: Today 07/04/19:  Mr. [name] is a 73 year old male with a history of obstructive sleep apnea on CPAP.  His download indicates that he use his machine nightly for compliance of 100%.  He uses machine greater than 4 hours each night.  On average he uses his machine 8 hours and 36 minutes.  His residual AHI is 1.4 on 5 to 18 cm of water with EPR 3.  He does not have a significant leak.  Overall he feels that he has been doing well.  He continues to notice the benefit with the CPAP.  In regards to his Parkinson's he reports a mild tremor in both upper extremities.  He has noticed hoarseness with his voice.  Typically gets worse as the day progresses.  He denies any trouble with his sleep since he started Klonopin.  He states on occasion he may flail his arms or act out a dream.  But this is improved since the initiation of Klonopin.  He reports on occasion he may have trouble chewing and swallowing type foods.  However he states is very rare for him to get choked.  He continues on Sinemet 25--100 mg 3 times a day.  he returns today for an evaluation.  HISTORY 12/27/18:  Mr. [name] is a 73 year old male with a history of obstructive sleep apnea on CPAP.  He returns today for a virtual visit.  His CPAP download indicates that he uses machine nightly for compliance of 100%.  He uses machine greater than 4 hours 29 out of 30 days for compliance of 97%.  On average he uses his machine 8 hours and 2 minutes.  His residual AHI is 2.1 on 5 to 18 cm of water with EPR 3.  He does not have a significant leak.  He is still trying to used to using of the mask.  He reports that he has a full facemask.  He has not yet seen the benefit of using the CPAP but plans to keep on trying it.  He joins me today for virtual visit.  REVIEW OF SYSTEMS: Out of a complete 14 system review of symptoms, the patient  complains only of the following symptoms, and all other reviewed systems are negative.  See HPI  ALLERGIES: No Known Allergies  HOME MEDICATIONS: Outpatient Medications Prior to Visit  Medication Sig Dispense Refill  . aspirin EC 81 MG tablet Take 81 mg by mouth daily.    . carbidopa-levodopa (SINEMET IR) 25-100 MG tablet Take 1 tablet by mouth 3 (three) times daily. Take them 4-5 hours apart. For example 8am, 12 and 4pm or similar.  Do not eat protein within 30 minutes of taking tabs. 270 tablet 4  . clonazePAM (KLONOPIN) 0.5 MG tablet Take 1 tablet (0.5 mg total) by mouth at bedtime. Take 1/2 to 1 pill at bedtime for sleep disorder 90 tablet 1  . fluticasone (FLONASE) 50 MCG/ACT nasal spray Place 2 sprays into the nose every morning.      Marland Kitchen HYDROcodone-acetaminophen (NORCO/VICODIN) 5-325 MG tablet Take 1 tablet by mouth every 6 (six) hours as needed for moderate pain. 15 tablet 0  . metoprolol succinate (TOPROL-XL) 25 MG 24 hr tablet Take 0.5 tablets (12.5 mg total) by mouth daily. 30 tablet 1  . Multiple Vitamins-Minerals (MULTIVITAMIN PO) Take by mouth.    . pantoprazole (PROTONIX) 40 MG tablet Take 40 mg  by mouth daily as needed.    . Polyethylene Glycol 3350 (MIRALAX PO) Take by mouth daily.     . sildenafil (REVATIO) 20 MG tablet Take 20 mg by mouth 3 (three) times daily as needed.     No facility-administered medications prior to visit.     PAST MEDICAL HISTORY: Past Medical History:  Diagnosis Date  . A-fib ([place])    isolated episode during colonoscopy  . Allergies   . Back pain   . Bladder cancer ([place]) dx aug'12   s/p turbt  . Enlarged prostate with urinary obstruction    nocturia/ freq/ dysuria  . GERD (gastroesophageal reflux disease)   . OSA on CPAP    wears CPAP nightly  . PD (Parkinson's disease) ([place])   . Skin cancer of nose 05/2018   basal cell    PAST SURGICAL HISTORY: Past Surgical History:  Procedure Laterality Date  . CERVICAL FUSION  2000   C4-5  .  COLONOSCOPY    . CYSTOSCOPY  06/16/2011   Procedure: CYSTOSCOPY;  Surgeon: Molli Hazard, MD;  [facility];  Service: Urology;  Laterality: N/A;  . CYSTOSCOPY WITH URETHRAL DILATATION  w/ TURBT BX   05-04-11  . INGUINAL HERNIA REPAIR Bilateral 02/07/2019   Procedure: BILATERAL INGUINAL HERNIA REPAIR WITH MESH;  Surgeon: Donnie Mesa, MD;  Location: [place];  Service: General;  Laterality: Bilateral;  GENERAL AND TAP BLOCK ANESTHESIA  . INSERTION OF MESH Bilateral 02/07/2019   Procedure: INSERTION OF MESH;  Surgeon: Donnie Mesa, MD;  Location: [place];  Service: General;  Laterality: Bilateral;  . SINUS SURGERY    . TRANSURETHRAL RESECTION OF PROSTATE  06/16/2011   Procedure: TRANSURETHRAL RESECTION OF THE PROSTATE (TURP);  Surgeon: Molli Hazard, MD;  Location: [facility];  Service: Urology;  Laterality: N/A;  URETHRAL DILATATION, MEATOTOMY, PENIL BLOCK    FAMILY HISTORY: Family History  Problem Relation Age of Onset  . Alzheimer's disease Mother   . Stroke Mother   . Alzheimer's disease Maternal Uncle   . Parkinson's disease Neg Hx     SOCIAL HISTORY: Social History   Socioeconomic History  . Marital status: Soil scientist    Spouse name: Not on file  . Number of children: 0  . Years of education: Not on file  . Highest education level: Master's degree (e.g., MA, MS, MEng, MEd, MSW, MBA)  Occupational History  . Not on file  Social Needs  . Financial resource strain: Not on file  . Food insecurity    Worry: Not on file    Inability: Not on file  . Transportation needs    Medical: Not on file    Non-medical: Not on file  Tobacco Use  . Smoking status: Former Smoker    Quit date: 09/1985    Years since quitting: 33.8  . Smokeless tobacco: Never Used  Substance and Sexual Activity  . Alcohol use: Yes    Alcohol/week: 14.0 standard drinks    Types: 14 Cans of beer per week     Comment: 2 beers daily  . Drug use: Never  . Sexual activity: Not on file  Lifestyle  . Physical activity    Days per week: Not on file    Minutes per session: Not on file  . Stress: Not on file  Relationships  . Social Herbalist on phone: Not on file    Gets together: Not on file  Attends religious service: Not on file    Active member of club or organization: Not on file    Attends meetings of clubs or organizations: Not on file    Relationship status: Not on file  . Intimate partner violence    Fear of current or ex partner: Not on file    Emotionally abused: Not on file    Physically abused: Not on file    Forced sexual activity: Not on file  Other Topics Concern  . Not on file  Social History Narrative   Lives at home with partner   Right handed   Caffeine: 2 cups of coffee each morning       PHYSICAL EXAM  Vitals:   07/04/19 1118  BP: 112/78  Pulse: (!) 54  Temp: (!) 97.4 F (36.3 C)  TempSrc: Oral  Weight: 184 lb (83.5 kg)  Height: 6\' 4"  (1.93 m)   Body mass index is 22.4 kg/m.  Generalized: Well developed, in no acute distress  Chest: Lungs clear to auscultation bilaterally  Neurological examination  Mentation: Alert oriented to time, place, history taking. Follows all commands.  Dysphonia Cranial nerve II-XII: Extraocular movements were full, visual field were full on confrontational test Head turning and shoulder shrug  were normal and symmetric. Motor: The motor testing reveals 5 over 5 strength of all 4 extremities. Good symmetric motor tone is noted throughout.  Mild cogwheeling in the right bicep.  Finger taps mildly impaired bilaterally.  Toe and heel taps mildly impaired bilaterally.   Sensory: Sensory testing is intact to soft touch on all 4 extremities. No evidence of extinction is noted.  Gait and station: Patient is able to stand without assistance.  He has good stride.  Good arm swing.  2 steps with turns.  Romberg is negative.     DIAGNOSTIC DATA (LABS, IMAGING, TESTING) - I reviewed patient records, labs, notes, testing and imaging myself where available.  Lab Results  Component Value Date   HGB 13.9 06/16/2011   HCT 41.0 06/16/2011      Component Value Date/Time   NA 146 (H) 05/23/2018 1453   K 4.5 05/23/2018 1453   CL 106 05/23/2018 1453   CO2 25 05/23/2018 1453   GLUCOSE 84 05/23/2018 1453   GLUCOSE 127 (H) 06/16/2011 1430   BUN 16 05/23/2018 1453   CREATININE 0.92 05/23/2018 1453   CALCIUM 8.9 05/23/2018 1453   GFRNONAA 83 05/23/2018 1453   GFRAA 96 05/23/2018 1453      ASSESSMENT AND PLAN 73 y.o. year old male  has a past medical history of A-fib ([place]), Allergies, Back pain, Bladder cancer ([place]) (dx aug'12), Enlarged prostate with urinary obstruction, GERD (gastroesophageal reflux disease), OSA on CPAP, PD (Parkinson's disease) ([place]), and Skin cancer of nose (05/2018). here with:  1. Obstructive sleep apnea on CPAP 2. Parkinson disease 3. REM sleep behavior disorder  The patient's CPAP download shows excellent compliance and good treatment of his apnea.  He is encouraged to continue using CPAP nightly and greater than 4 hours each night.  He will continue on Sinemet 25-100 mg 3 times a day.  He will continue on Klonopin at bedtime.  He is advised that if his symptoms worsen or he develops new symptoms he should let us know.  He will follow-up in 6 months or sooner if needed    Ward Givens, MSN, NP-C 07/04/2019, 11:14 AM [facility] [address], [place], [place] [address]  Made any corrections needed,  and agree with history, physical, neuro exam,assessment and plan as stated.     Sarina Ill, MD [facility]  [phone]

## 2019-07-13 ENCOUNTER — Encounter

## 2019-07-13 ENCOUNTER — Ambulatory Visit: Payer: Medicare Other | Admitting: Physician Assistant

## 2019-08-24 ENCOUNTER — Telehealth: Payer: Self-pay | Admitting: Neurology

## 2019-08-28 NOTE — Telephone Encounter (Signed)
Per [place] registry, last refill 06/18/2019 written 12/27/2018 Clonazepam 0.5 Mg Tablet #90.00 90 An Ahe

## 2019-09-06 ENCOUNTER — Encounter: Payer: Self-pay | Admitting: *Deleted

## 2019-09-06 NOTE — Telephone Encounter (Signed)
I called the pt and let him know the refill request was too early. Unable to fill until on or after 09/18/2019. Pt verbalized understanding. He will fax his new insurance info the office.

## 2019-09-06 NOTE — Telephone Encounter (Signed)
Patient called in to check status of medication refill

## 2019-09-06 NOTE — Telephone Encounter (Signed)
Patient supplied via fax his new insurance info  (copy of front of card) which I have sent to medical records for scanning. He also provided a notice he had received from [facility] stating his Clonazepam 0.5 mg and hydrocodone-acet 5-325 are now limited to 30 day supply allowances.   [employer] ID: [id number] Rx BIN: [id number] Rx PCN: [id number] Rx GRP: [id number]

## 2019-09-09 IMAGING — NM NM DATSCAN
3 series · 18 of 18 positions shown · non-contrast
Comparison: Brain MRI 06/14/2018

CLINICAL DATA: Pt c/o memory problems x 5-6 months. He states he
has difficulty remembering words and after a couple of minutes he'll
remember.

EXAM:
NUCLEAR MEDICINE BRAIN IMAGING WITH SPECT  (DaTscan )
TECHNIQUE: SPECT images of the brain were obtained after intravenous injection
of radiopharmaceutical. 4 hour post injection imaging. Appropriate
positioning. 0.8 ml lugols solution administered in a.m
RADIOPHARMACEUTICALS:  4.6 millicuries I 123 Ioflupane

[Series 1: brain spect · 4.14mm/px · 6 of 120 frames shown]
[frame 11/120  full-range]
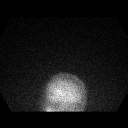
[frame 31/120  full-range]
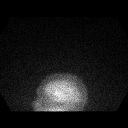
[frame 51/120  full-range]
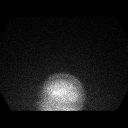
[frame 71/120  full-range]
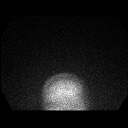
[frame 91/120  full-range]
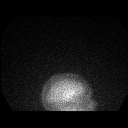
[frame 111/120  full-range]
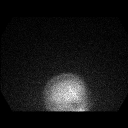

[Series 1: spect - (id) _(id)_tra · 4.1mm · 4.14mm/px · 6 of 128 frames shown]
[frame 11/128]
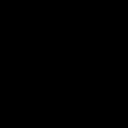
[frame 32/128]
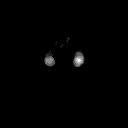
[frame 54/128]
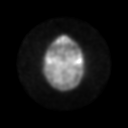
[frame 75/128]
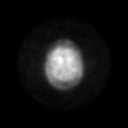
[frame 96/128]
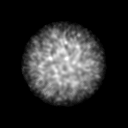
[frame 118/128]
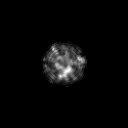

[Series 1: spect - (id) _(id)_cor · 4.1mm · 4.14mm/px · 6 of 128 frames shown]
[frame 11/128]
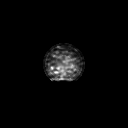
[frame 32/128]
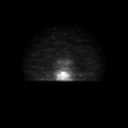
[frame 54/128]
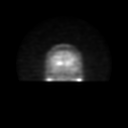
[frame 75/128]
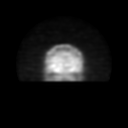
[frame 96/128]
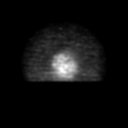
[frame 118/128]
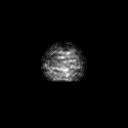

[18 of 18 positions shown; findings below may reference images not displayed]

FINDINGS: Near absent radiotracer activity within the posterior LEFT and RIGHT
striata (putamen). There is decreased relative activity within the
head of the LEFT caudate nucleus compared to the RIGHT. The findings
are consistent with loss of dopamine transport populations within
the striata.
IMPRESSION: Loss of dopamine transport populations within the LEFT and RIGHT
putamen is a pattern consistent with Parkinson's syndrome pathology.

## 2019-09-21 ENCOUNTER — Other Ambulatory Visit: Payer: Self-pay | Admitting: Neurology

## 2019-10-19 ENCOUNTER — Other Ambulatory Visit: Payer: Self-pay | Admitting: *Deleted

## 2019-10-19 MED ORDER — METOPROLOL SUCCINATE ER 25 MG PO TB24: 12.5000 mg | ORAL_TABLET | Freq: Every day | ORAL | 0 refills | Status: DC

## 2019-11-10 ENCOUNTER — Other Ambulatory Visit: Payer: Self-pay | Admitting: Cardiology

## 2019-11-12 ENCOUNTER — Ambulatory Visit: Payer: Medicare Other | Admitting: Cardiovascular Disease

## 2019-11-14 ENCOUNTER — Ambulatory Visit: Payer: Medicare PPO | Admitting: Cardiovascular Disease

## 2019-11-14 ENCOUNTER — Other Ambulatory Visit: Payer: Self-pay

## 2019-11-14 ENCOUNTER — Encounter: Payer: Self-pay | Admitting: Cardiovascular Disease

## 2019-11-14 VITALS — BP 126/80 | HR 52 | Ht 76.0 in | Wt 182.0 lb

## 2019-11-14 DIAGNOSIS — I480 Paroxysmal atrial fibrillation: Secondary | ICD-10-CM

## 2019-11-14 NOTE — Progress Notes (Addendum)
Chief Complaint  Patient presents with  . Follow-up    atrial fib   History of Present Illness: 74 yo male with history of remote PVCs, atrial fibrillation, BPH, bladder cancer, GERD, sleep apnea on CPAP and Parkinson's disease who is here today for cardiac follow up. He was followed by Dr. Wynonia Lawman and was seen in 2020 by Dr. Agustin Cree but is changing to our office today. He tells me that he had a remote episode of atrial fibrillation during colonoscopy. He was not anti-coagulated due to CHADS VASC score of 1. Echo February 2020 with LVEF=60-65%. Trivial AI.   Addendum: records received from Dr. Ezekiel Slocumb office. Normal exercise stress test April 2015.   He is here today for follow up. The patient denies any chest pain, dyspnea, palpitations, lower extremity edema, orthopnea, PND, dizziness, near syncope or syncope.   Primary Care Physician: Lawerance Cruel, MD   Past Medical History:  Diagnosis Date  . A-fib ([place])    isolated episode during colonoscopy  . Allergies   . Back pain   . Bladder cancer ([place]) dx aug'12   s/p turbt  . Enlarged prostate with urinary obstruction    nocturia/ freq/ dysuria  . GERD (gastroesophageal reflux disease)   . OSA on CPAP    wears CPAP nightly  . PD (Parkinson's disease) ([place])   . Skin cancer of nose 05/2018   basal cell    Past Surgical History:  Procedure Laterality Date  . CERVICAL FUSION  2000   C4-5  . COLONOSCOPY    . CYSTOSCOPY  06/16/2011   Procedure: CYSTOSCOPY;  Surgeon: Molli Hazard, MD;  Location: [facility];  Service: Urology;  Laterality: N/A;  . CYSTOSCOPY WITH URETHRAL DILATATION  w/ TURBT BX   05-04-11  . INGUINAL HERNIA REPAIR Bilateral 02/07/2019   Procedure: BILATERAL INGUINAL HERNIA REPAIR WITH MESH;  Surgeon: Donnie Mesa, MD;  Location: [place];  Service: General;  Laterality: Bilateral;  GENERAL AND TAP BLOCK ANESTHESIA  . INSERTION OF MESH Bilateral 02/07/2019   Procedure:  INSERTION OF MESH;  Surgeon: Donnie Mesa, MD;  Location: [place];  Service: General;  Laterality: Bilateral;  . SINUS SURGERY    . TRANSURETHRAL RESECTION OF PROSTATE  06/16/2011   Procedure: TRANSURETHRAL RESECTION OF THE PROSTATE (TURP);  Surgeon: Molli Hazard, MD;  Location: [facility];  Service: Urology;  Laterality: N/A;  URETHRAL DILATATION, MEATOTOMY, PENIL BLOCK    Current Outpatient Medications  Medication Sig Dispense Refill  . aspirin EC 81 MG tablet Take 81 mg by mouth daily.    . carbidopa-levodopa (SINEMET IR) 25-100 MG tablet Take 1 tablet by mouth 3 (three) times daily. Take them 4-5 hours apart. For example 8am, 12 and 4pm or similar.  Do not eat protein within 30 minutes of taking tabs. 270 tablet 4  . clonazePAM (KLONOPIN) 0.5 MG tablet TAKE 1/2 TO 1 TAB BY MOUTH AT BEDTIME FOR SLEEP 30 tablet 4  . fluticasone (FLONASE) 50 MCG/ACT nasal spray Place 2 sprays into the nose every morning.      . metoprolol succinate (TOPROL-XL) 25 MG 24 hr tablet Take 0.5 tablets (12.5 mg total) by mouth daily. PLEASE CALL OFFICE TO SCHEDULE APPOINTMENT FOR FURTHER REFILLS 15 tablet 0  . Multiple Vitamins-Minerals (MULTIVITAMIN PO) Take by mouth.    . Polyethylene Glycol 3350 (MIRALAX PO) Take by mouth daily.     . sildenafil (REVATIO) 20 MG tablet Take 20 mg  by mouth 3 (three) times daily as needed.     No current facility-administered medications for this visit.    No Known Allergies  Social History   Socioeconomic History  . Marital status: Soil scientist    Spouse name: Not on file  . Number of children: 0  . Years of education: Not on file  . Highest education level: Master's degree (e.g., MA, MS, MEng, MEd, MSW, MBA)  Occupational History  . Occupation: Band teacher-Retired  Tobacco Use  . Smoking status: Former Smoker    Types: Cigarettes    Quit date: 09/1985    Years since quitting: 34.2  . Smokeless tobacco: Never Used    Substance and Sexual Activity  . Alcohol use: Yes    Alcohol/week: 14.0 standard drinks    Types: 14 Cans of beer per week    Comment: 2 beers daily  . Drug use: Never  . Sexual activity: Not on file  Other Topics Concern  . Not on file  Social History Narrative   Lives at home with partner   Right handed   Caffeine: 2 cups of coffee each morning    Social Determinants of Health   Financial Resource Strain:   . Difficulty of Paying Living Expenses:   Food Insecurity:   . Worried About Charity fundraiser in the Last Year:   . Arboriculturist in the Last Year:   Transportation Needs:   . Film/video editor (Medical):   Marland Kitchen Lack of Transportation (Non-Medical):   Physical Activity:   . Days of Exercise per Week:   . Minutes of Exercise per Session:   Stress:   . Feeling of Stress :   Social Connections:   . Frequency of Communication with Friends and Family:   . Frequency of Social Gatherings with Friends and Family:   . Attends Religious Services:   . Active Member of Clubs or Organizations:   . Attends Archivist Meetings:   Marland Kitchen Marital Status:   Intimate Partner Violence:   . Fear of Current or Ex-Partner:   . Emotionally Abused:   Marland Kitchen Physically Abused:   . Sexually Abused:     Family History  Problem Relation Age of Onset  . Alzheimer's disease Mother   . Stroke Mother   . Alzheimer's disease Maternal Uncle   . Parkinson's disease Neg Hx     Review of Systems:  As stated in the HPI and otherwise negative.   BP 126/80   Pulse (!) 52   Ht 6\' 4"  (1.93 m)   Wt 182 lb (82.6 kg)   SpO2 98%   BMI 22.15 kg/m   Physical Examination: General: Well developed, well nourished, NAD  HEENT: OP clear, mucus membranes moist  SKIN: warm, dry. No rashes. Neuro: No focal deficits  Musculoskeletal: Muscle strength 5/5 all ext  Psychiatric: Mood and affect normal  Neck: No JVD, no carotid bruits, no thyromegaly, no lymphadenopathy.  Lungs:Clear bilaterally,  no wheezes, rhonci, crackles Cardiovascular: Irregular  No murmurs, gallops or rubs. Abdomen:Soft. Bowel sounds present. Non-tender.  Extremities: No lower extremity edema. Pulses are 2 + in the bilateral DP/PT.  EKG:  EKG is ordered today. The ekg ordered today demonstrates Sinus with PACs (Reviewed with partners)  Echo February 2020:  1. The left ventricle has normal systolic function with an ejection  fraction of 60-65%. The cavity size was normal. Left ventricular diastolic  parameters were normal.  2. The right ventricle has normal  systolic function. The cavity was  normal. There is no increase in right ventricular wall thickness.  3. The mitral valve is normal in structure.  4. The tricuspid valve is normal in structure.  5. The aortic valve is tricuspid Aortic valve regurgitation is trivial by  color flow Doppler.  6. The pulmonic valve was normal in structure. Pulmonic valve  regurgitation is mild by color flow Doppler.  7. There is mild dilatation of the ascending aorta measuring 36 mm.   Recent Labs: No results found for requested labs within last 8760 hours.   Lipid Panel No results found for: CHOL, TRIG, HDL, CHOLHDL, VLDL, LDLCALC, LDLDIRECT   Wt Readings from Last 3 Encounters:  11/14/19 182 lb (82.6 kg)  07/04/19 184 lb (83.5 kg)  02/07/19 183 lb 10.3 oz (83.3 kg)     Assessment and Plan:   1. Paroxysmal atrial fibrillation: He is in sinus today. His rhythm is irregular on exam but EKG with sinus with PACs. I reviewed with my partners today to confirm this. Continue ASA and metoprolol. Will get records from Dr. Thurman Coyer office  Current medicines are reviewed at length with the patient today.  The patient does not have concerns regarding medicines.  The following changes have been made:  no change  Labs/ tests ordered today include:  No orders of the defined types were placed in this encounter.  Disposition:   FU with me in 12 months.     Signed, Lauree Chandler, MD 11/14/2019 5:12 PM    [facility] [address], [place], [place]  [address] Phone: [phone]; Fax: [phone]

## 2019-11-14 NOTE — Patient Instructions (Signed)

## 2019-11-15 NOTE — Addendum Note (Signed)
Addended by: Mendel Ryder on: 11/15/2019 01:06 PM   Modules accepted: Orders

## 2019-11-24 ENCOUNTER — Other Ambulatory Visit: Payer: Self-pay | Admitting: Neurology

## 2020-01-02 ENCOUNTER — Encounter: Payer: Self-pay | Admitting: Adult Health

## 2020-01-07 ENCOUNTER — Ambulatory Visit: Payer: Medicare PPO | Admitting: Adult Health

## 2020-01-07 ENCOUNTER — Encounter: Payer: Self-pay | Admitting: Adult Health

## 2020-01-07 ENCOUNTER — Other Ambulatory Visit: Payer: Self-pay

## 2020-01-07 VITALS — BP 132/81 | HR 56 | Ht 76.0 in | Wt 181.0 lb

## 2020-01-07 DIAGNOSIS — G20 Parkinson's disease: Secondary | ICD-10-CM

## 2020-01-07 DIAGNOSIS — Z9989 Dependence on other enabling machines and devices: Secondary | ICD-10-CM | POA: Diagnosis not present

## 2020-01-07 DIAGNOSIS — G4733 Obstructive sleep apnea (adult) (pediatric): Secondary | ICD-10-CM | POA: Diagnosis not present

## 2020-01-07 NOTE — Progress Notes (Signed)
PATIENT: Ryan Woodward DOB: Oct 24, 1945  REASON FOR VISIT: follow up HISTORY FROM: patient  HISTORY OF PRESENT ILLNESS: Today 01/07/20:  Ryan Woodward is a 74 year old male with a history of obstructive sleep apnea on CPAP.  His download indicates that he use his machine nightly for compliance of 100%.  He uses machine greater than 4 hours 29 days for compliance of 97%.  On average he uses his machine 7 hours and 54 minutes.  His residual AHI is 1.8 on 5 to 18 cm of water with EPR of 3.  Leak in the 95th percentile is 21 L/min.  He reports that the CPAP continues to work well for him.  He currently takes Sinemet 1 tablet 3 times a day for Parkinson's disease.  Reports that he does not have a significant tremor.  Denies any changes with his gait or balance.  Reports on occasion he have trouble swallowing certain foods.  This does not occur with every meal.  Reports that in regards to his memory he does have trouble in the most with numbers.  Denies any new issues.  Returns today for an evaluation.  HISTORY 07/04/19:  Ryan Woodward is a 74 year old male with a history of obstructive sleep apnea on CPAP.  His download indicates that he use his machine nightly for compliance of 100%.  He uses machine greater than 4 hours each night.  On average he uses his machine 8 hours and 36 minutes.  His residual AHI is 1.4 on 5 to 18 cm of water with EPR 3.  He does not have a significant leak.  Overall he feels that he has been doing well.  He continues to notice the benefit with the CPAP.  In regards to his Parkinson's he reports a mild tremor in both upper extremities.  He has noticed hoarseness with his voice.  Typically gets worse as the day progresses.  He denies any trouble with his sleep since he started Klonopin.  He states on occasion he may flail his arms or act out a dream.  But this is improved since the initiation of Klonopin.  He reports on occasion he may have trouble chewing and swallowing type foods.   However he states is very rare for him to get choked.  He continues on Sinemet 25--100 mg 3 times a day.  he returns today for an evaluation.  REVIEW OF SYSTEMS: Out of a complete 14 system review of symptoms, the patient complains only of the following symptoms, and all other reviewed systems are negative.  ALLERGIES: No Known Allergies  HOME MEDICATIONS: Outpatient Medications Prior to Visit  Medication Sig Dispense Refill  . aspirin EC 81 MG tablet Take 81 mg by mouth daily.    . carbidopa-levodopa (SINEMET IR) 25-100 MG tablet TAKE 1 TAB 3 XDAILY TAKE 4-5HRS APART FOR EXAMPLE 8AM 12 & 4PM OR SIMILAR DO NOT EAT PROTEIN 30 MIM 270 tablet 0  . clonazePAM (KLONOPIN) 0.5 MG tablet TAKE 1/2 TO 1 TAB BY MOUTH AT BEDTIME FOR SLEEP 30 tablet 4  . fluticasone (FLONASE) 50 MCG/ACT nasal spray Place 2 sprays into the nose every morning.      . metoprolol succinate (TOPROL-XL) 25 MG 24 hr tablet Take 0.5 tablets (12.5 mg total) by mouth daily. PLEASE CALL OFFICE TO SCHEDULE APPOINTMENT FOR FURTHER REFILLS 15 tablet 0  . Multiple Vitamins-Minerals (MULTIVITAMIN PO) Take by mouth.    . Polyethylene Glycol 3350 (MIRALAX PO) Take by mouth daily.     Marland Kitchen  sildenafil (REVATIO) 20 MG tablet Take 20 mg by mouth 3 (three) times daily as needed.     No facility-administered medications prior to visit.    PAST MEDICAL HISTORY: Past Medical History:  Diagnosis Date  . A-fib ([place])    isolated episode during colonoscopy  . Allergies   . Back pain   . Bladder cancer ([place]) dx aug'12   s/p turbt  . Enlarged prostate with urinary obstruction    nocturia/ freq/ dysuria  . GERD (gastroesophageal reflux disease)   . OSA on CPAP    wears CPAP nightly  . PD (Parkinson's disease) ([place])   . Skin cancer of nose 05/2018   basal cell    PAST SURGICAL HISTORY: Past Surgical History:  Procedure Laterality Date  . CERVICAL FUSION  2000   C4-5  . COLONOSCOPY    . CYSTOSCOPY  06/16/2011   Procedure: CYSTOSCOPY;   Surgeon: Molli Hazard, MD;  [facility];  Service: Urology;  Laterality: N/A;  . CYSTOSCOPY WITH URETHRAL DILATATION  w/ TURBT BX   05-04-11  . INGUINAL HERNIA REPAIR Bilateral 02/07/2019   Procedure: BILATERAL INGUINAL HERNIA REPAIR WITH MESH;  Surgeon: Donnie Mesa, MD;  Location: [place];  Service: General;  Laterality: Bilateral;  GENERAL AND TAP BLOCK ANESTHESIA  . INSERTION OF MESH Bilateral 02/07/2019   Procedure: INSERTION OF MESH;  Surgeon: Donnie Mesa, MD;  Location: [place];  Service: General;  Laterality: Bilateral;  . SINUS SURGERY    . TRANSURETHRAL RESECTION OF PROSTATE  06/16/2011   Procedure: TRANSURETHRAL RESECTION OF THE PROSTATE (TURP);  Surgeon: Molli Hazard, MD;  Location: [facility];  Service: Urology;  Laterality: N/A;  URETHRAL DILATATION, MEATOTOMY, PENIL BLOCK    FAMILY HISTORY: Family History  Problem Relation Age of Onset  . Alzheimer's disease Mother   . Stroke Mother   . Alzheimer's disease Maternal Uncle   . Parkinson's disease Neg Hx     SOCIAL HISTORY: Social History   Socioeconomic History  . Marital status: Soil scientist    Spouse name: Not on file  . Number of children: 0  . Years of education: Not on file  . Highest education level: Master's degree (e.g., MA, MS, MEng, MEd, MSW, MBA)  Occupational History  . Occupation: Band teacher-Retired  Tobacco Use  . Smoking status: Former Smoker    Types: Cigarettes    Quit date: 09/1985    Years since quitting: 34.3  . Smokeless tobacco: Never Used  Substance and Sexual Activity  . Alcohol use: Yes    Alcohol/week: 14.0 standard drinks    Types: 14 Cans of beer per week    Comment: 2 beers daily  . Drug use: Never  . Sexual activity: Not on file  Other Topics Concern  . Not on file  Social History Narrative   Lives at home with partner   Right handed   Caffeine: 2 cups of coffee each morning     Social Determinants of Health   Financial Resource Strain:   . Difficulty of Paying Living Expenses:   Food Insecurity:   . Worried About Charity fundraiser in the Last Year:   . Arboriculturist in the Last Year:   Transportation Needs:   . Film/video editor (Medical):   Marland Kitchen Lack of Transportation (Non-Medical):   Physical Activity:   . Days of Exercise per Week:   . Minutes of Exercise per Session:  Stress:   . Feeling of Stress :   Social Connections:   . Frequency of Communication with Friends and Family:   . Frequency of Social Gatherings with Friends and Family:   . Attends Religious Services:   . Active Member of Clubs or Organizations:   . Attends Archivist Meetings:   Marland Kitchen Marital Status:   Intimate Partner Violence:   . Fear of Current or Ex-Partner:   . Emotionally Abused:   Marland Kitchen Physically Abused:   . Sexually Abused:       PHYSICAL EXAM  Vitals:   01/07/20 1109  BP: 132/81  Pulse: (!) 56  Weight: 181 lb (82.1 kg)  Height: 6\' 4"  (1.93 m)   Body mass index is 22.03 kg/m.  Generalized: Well developed, in no acute distress   Neurological examination  Mentation: Alert oriented to time, place, history taking. Follows all commands speech and language fluent Cranial nerve II-XII: Pupils were equal round reactive to light. Extraocular movements were full, visual field were full on confrontational test. Facial sensation and strength were normal. Uvula tongue midline. Head turning and shoulder shrug  were normal and symmetric. Motor: The motor testing reveals 5 over 5 strength of all 4 extremities. Good symmetric motor tone is noted throughout.  Sensory: Sensory testing is intact to soft touch on all 4 extremities. No evidence of extinction is noted.  Coordination: Cerebellar testing reveals good finger-nose-finger and heel-to-shin bilaterally.  Gait and station: Patient able to stand without assistance.  Good stride.  Slightly decreased arm swing.   Good turns. Reflexes: Deep tendon reflexes are symmetric and normal bilaterally.   DIAGNOSTIC DATA (LABS, IMAGING, TESTING) - I reviewed patient records, labs, notes, testing and imaging myself where available.  Lab Results  Component Value Date   HGB 13.9 06/16/2011   HCT 41.0 06/16/2011      Component Value Date/Time   NA 146 (H) 05/23/2018 1453   K 4.5 05/23/2018 1453   CL 106 05/23/2018 1453   CO2 25 05/23/2018 1453   GLUCOSE 84 05/23/2018 1453   GLUCOSE 127 (H) 06/16/2011 1430   BUN 16 05/23/2018 1453   CREATININE 0.92 05/23/2018 1453   CALCIUM 8.9 05/23/2018 1453   GFRNONAA 83 05/23/2018 1453   GFRAA 96 05/23/2018 1453      ASSESSMENT AND PLAN 74 y.o. year old male  has a past medical history of A-fib ([place]), Allergies, Back pain, Bladder cancer ([place]) (dx aug'12), Enlarged prostate with urinary obstruction, GERD (gastroesophageal reflux disease), OSA on CPAP, PD (Parkinson's disease) ([place]), and Skin cancer of nose (05/2018). here with :  Obstructive sleep apnea on CPAP   Good compliance  Good treatment of apnea  Encouraged use CPAP nightly and greater than 4 hours each night  Parkinson's disease   Symptoms stable  Continue Sinemet IR 3 times a day  Advised patient that if his symptoms worsen or he develops new symptoms he should let us know and follow-up in 6 months or sooner if needed   I spent 25 minutes of face-to-face and non-face-to-face time with patient.  This included previsit chart review, lab review, study review, order entry, electronic health record documentation, patient education.  Ward Givens, MSN, NP-C 01/07/2020, 11:49 AM [facility] [address] [place], [place] [address] [phone]

## 2020-01-07 NOTE — Patient Instructions (Signed)
Your Plan:  Continue CPAP Continue Sinemet If your symptoms worsen or you develop new symptoms please let us know.   Thank you for coming to see [place] at [facility]. I hope we have been able to provide you high quality care today.  You may receive a patient satisfaction survey over the next few weeks. We would appreciate your feedback and comments so that we may continue to improve ourselves and the health of our patients.

## 2020-01-21 ENCOUNTER — Telehealth: Payer: Self-pay | Admitting: Cardiovascular Disease

## 2020-01-21 NOTE — Telephone Encounter (Signed)
Pt c/o medication issue:  1. Name of Medication:   metoprolol succinate (TOPROL-XL) 25 MG 24 hr tablet   2. How are you currently taking this medication (dosage and times per day)? As directed  3. Are you having a reaction (difficulty breathing--STAT)? no  4. What is your medication issue? Patient states that the instructions used to state, "Take 0.5 tablet to 1 tablet daily as needed" but now the instructions say to take 0.5 tablet daily. He wants to know if a new prescription can be written to where he can take up 1 tablet daily. Please advise.

## 2020-01-21 NOTE — Telephone Encounter (Signed)
Left detailed message on mobile number (DPR) Do not see where Toprol XL has ever been written as 0.5 tab - 1 tab. Wanted to clarify this and find out why he may need increase.  Noted that clonazePAM is prescribed 0.5 -1 tab and want to make sure he is not referring to this.

## 2020-01-21 NOTE — Telephone Encounter (Signed)
See other phone encounter from today for this patient.

## 2020-01-21 NOTE — Telephone Encounter (Signed)
Pt now follows DR. McAlhany. I will forward call to Dr. Camillia Herter nurse.

## 2020-01-21 NOTE — Telephone Encounter (Signed)
*  STAT* If patient is at the pharmacy, call can be transferred to refill team.   1. Which medications need to be refilled? (please list name of each medication and dose if known) metoprolol succinate (TOPROL-XL) 25 MG 24 hr tablet  2. Which pharmacy/location (including street and city if local pharmacy) is medication to be sent to? [id number] [place], [place]  3. Do they need a 30 day or 90 day supply? 90   Patient now see's Dr. Angelena Form and followed up with him in April.

## 2020-01-23 MED ORDER — METOPROLOL SUCCINATE ER 25 MG PO TB24: 12.5000 mg | ORAL_TABLET | Freq: Every day | ORAL | 3 refills | Status: DC

## 2020-01-23 NOTE — Telephone Encounter (Signed)
Patient returned my all and confirmed he is only taking Toprol XL 12.5 mg daily. I will send refill.  [facility].

## 2020-02-21 ENCOUNTER — Other Ambulatory Visit: Payer: Self-pay | Admitting: Neurology

## 2020-02-21 ENCOUNTER — Telehealth: Payer: Self-pay | Admitting: Cardiovascular Disease

## 2020-02-21 NOTE — Telephone Encounter (Signed)
*  STAT* If patient is at the pharmacy, call can be transferred to refill team.   1. Which medications need to be refilled? (please list name of each medication and dose if known) metoprolol succinate (TOPROL-XL) 25 MG 24 hr tablet  2. Which pharmacy/location (including street and city if local pharmacy) is medication to be sent to? [id number] [place], [place]  3. Do they need a 30 day or 90 day supply? 90 day   Patient states he needs the oval pills not the circle ones.

## 2020-02-21 NOTE — Telephone Encounter (Signed)
Called pt to inform him that his medication was already sent to his pharmacy as requested. I also called pt's pharmacy as well to make sure that they had the prescription and the pharmacist stated that pt's medication was ready to be picked up and I explained to the pharmacist how the pt requested the oval pills and not the circle ones and the pharmacist stated that he would correct this for the pt. Pharmacist verbalized understanding.

## 2020-02-25 ENCOUNTER — Other Ambulatory Visit: Payer: Self-pay | Admitting: Adult Health

## 2020-03-04 DIAGNOSIS — Z23 Encounter for immunization: Secondary | ICD-10-CM | POA: Diagnosis not present

## 2020-03-04 DIAGNOSIS — J309 Allergic rhinitis, unspecified: Secondary | ICD-10-CM | POA: Diagnosis not present

## 2020-03-04 DIAGNOSIS — Z131 Encounter for screening for diabetes mellitus: Secondary | ICD-10-CM | POA: Diagnosis not present

## 2020-03-04 DIAGNOSIS — Z79899 Other long term (current) drug therapy: Secondary | ICD-10-CM | POA: Diagnosis not present

## 2020-03-04 DIAGNOSIS — Z0000 Encounter for general adult medical examination without abnormal findings: Secondary | ICD-10-CM | POA: Diagnosis not present

## 2020-03-04 DIAGNOSIS — K219 Gastro-esophageal reflux disease without esophagitis: Secondary | ICD-10-CM | POA: Diagnosis not present

## 2020-04-04 DIAGNOSIS — G4733 Obstructive sleep apnea (adult) (pediatric): Secondary | ICD-10-CM | POA: Diagnosis not present

## 2020-04-14 ENCOUNTER — Other Ambulatory Visit: Payer: Self-pay | Admitting: Adult Health

## 2020-04-15 ENCOUNTER — Other Ambulatory Visit: Payer: Self-pay | Admitting: Adult Health

## 2020-04-15 MED ORDER — CLONAZEPAM 0.5 MG PO TABS: ORAL_TABLET | ORAL | 5 refills | Status: DC

## 2020-04-15 NOTE — Telephone Encounter (Signed)
Pt request refill clonazePAM (KLONOPIN) 0.5 MG tablet at [place], [place].  Pt has updated pharmacy information due to moving to [place], [place]

## 2020-05-05 DIAGNOSIS — M9904 Segmental and somatic dysfunction of sacral region: Secondary | ICD-10-CM | POA: Diagnosis not present

## 2020-05-05 DIAGNOSIS — M5136 Other intervertebral disc degeneration, lumbar region: Secondary | ICD-10-CM | POA: Diagnosis not present

## 2020-05-05 DIAGNOSIS — M9905 Segmental and somatic dysfunction of pelvic region: Secondary | ICD-10-CM | POA: Diagnosis not present

## 2020-05-05 DIAGNOSIS — M9903 Segmental and somatic dysfunction of lumbar region: Secondary | ICD-10-CM | POA: Diagnosis not present

## 2020-05-14 ENCOUNTER — Other Ambulatory Visit: Payer: Self-pay | Admitting: *Deleted

## 2020-05-14 MED ORDER — CARBIDOPA-LEVODOPA 25-100 MG PO TABS: ORAL_TABLET | ORAL | 1 refills | Status: DC

## 2020-06-10 DIAGNOSIS — C672 Malignant neoplasm of lateral wall of bladder: Secondary | ICD-10-CM | POA: Diagnosis not present

## 2020-06-10 DIAGNOSIS — N400 Enlarged prostate without lower urinary tract symptoms: Secondary | ICD-10-CM | POA: Diagnosis not present

## 2020-06-10 DIAGNOSIS — N5201 Erectile dysfunction due to arterial insufficiency: Secondary | ICD-10-CM | POA: Diagnosis not present

## 2020-07-03 DIAGNOSIS — Z20822 Contact with and (suspected) exposure to covid-19: Secondary | ICD-10-CM | POA: Diagnosis not present

## 2020-07-04 DIAGNOSIS — G4733 Obstructive sleep apnea (adult) (pediatric): Secondary | ICD-10-CM | POA: Diagnosis not present

## 2020-07-21 ENCOUNTER — Encounter: Payer: Self-pay | Admitting: Adult Health

## 2020-07-21 ENCOUNTER — Ambulatory Visit: Payer: Medicare PPO | Admitting: Adult Health

## 2020-07-21 ENCOUNTER — Other Ambulatory Visit: Payer: Self-pay

## 2020-07-21 ENCOUNTER — Telehealth: Payer: Self-pay | Admitting: Adult Health

## 2020-07-21 VITALS — BP 141/69 | HR 51 | Ht 76.0 in | Wt 180.0 lb

## 2020-07-21 DIAGNOSIS — Z9989 Dependence on other enabling machines and devices: Secondary | ICD-10-CM

## 2020-07-21 DIAGNOSIS — R413 Other amnesia: Secondary | ICD-10-CM

## 2020-07-21 DIAGNOSIS — Z03818 Encounter for observation for suspected exposure to other biological agents ruled out: Secondary | ICD-10-CM | POA: Diagnosis not present

## 2020-07-21 DIAGNOSIS — G4733 Obstructive sleep apnea (adult) (pediatric): Secondary | ICD-10-CM | POA: Diagnosis not present

## 2020-07-21 DIAGNOSIS — G20 Parkinson's disease: Secondary | ICD-10-CM | POA: Diagnosis not present

## 2020-07-21 DIAGNOSIS — Z20822 Contact with and (suspected) exposure to covid-19: Secondary | ICD-10-CM | POA: Diagnosis not present

## 2020-07-21 NOTE — Telephone Encounter (Signed)
Pt. is calling to see if he should come for appt. or not as he has a slight cold. Please advise.

## 2020-07-21 NOTE — Progress Notes (Addendum)
PATIENT: Ryan Woodward DOB: 09-12-1945  REASON FOR VISIT: follow up HISTORY FROM: patient  HISTORY OF PRESENT ILLNESS: Today 07/21/20: Ryan Woodward is a 74 year old male with a history of obstructive sleep apnea on CPAP.  His download indicates that he uses machine 27 out of 30 days for compliance of 90%.  He uses machine greater than 4 hours 26 days for compliance of 87%.  On average he uses his machine 8 hours and 7 minutes.  His residual AHI is 0.8 on 5 to 18 cm of water with EPR 3.  Leak in the 95th percentile is 10.8 L/min.  In regards to his Parkinson's he feels that he has remained stable.  Continues to have mild tremor in the upper extremities.  No changes with his gait or balance.  No falls.  Continues to have some trouble swallowing food.  Does not wish to proceed with any further testing.  He also notices changes with his memory.  States that he can start a sentence but sometimes cannot finish it.  His partner reports that he does not know how to use the TV remote.  He is able to complete all ADLs independently.  He was managing the finances but has given this up due to mistakes.  He returns today for an evaluation.  01/07/20:Ryan Woodward is a 74 year old male with a history of obstructive sleep apnea on CPAP.  His download indicates that he use his machine nightly for compliance of 100%.  He uses machine greater than 4 hours 29 days for compliance of 97%.  On average he uses his machine 7 hours and 54 minutes.  His residual AHI is 1.8 on 5 to 18 cm of water with EPR of 3.  Leak in the 95th percentile is 21 L/min.  He reports that the CPAP continues to work well for him.  He currently takes Sinemet 1 tablet 3 times a day for Parkinson's disease.  Reports that he does not have a significant tremor.  Denies any changes with his gait or balance.  Reports on occasion he have trouble swallowing certain foods.  This does not occur with every meal.  Reports that in regards to his memory he does have  trouble in the most with numbers.  Denies any new issues.  Returns today for an evaluation.  HISTORY 07/04/19:  Ryan Woodward is a 74 year old male with a history of obstructive sleep apnea on CPAP.  His download indicates that he use his machine nightly for compliance of 100%.  He uses machine greater than 4 hours each night.  On average he uses his machine 8 hours and 36 minutes.  His residual AHI is 1.4 on 5 to 18 cm of water with EPR 3.  He does not have a significant leak.  Overall he feels that he has been doing well.  He continues to notice the benefit with the CPAP.  In regards to his Parkinson's he reports a mild tremor in both upper extremities.  He has noticed hoarseness with his voice.  Typically gets worse as the day progresses.  He denies any trouble with his sleep since he started Klonopin.  He states on occasion he may flail his arms or act out a dream.  But this is improved since the initiation of Klonopin.  He reports on occasion he may have trouble chewing and swallowing type foods.  However he states is very rare for him to get choked.  He continues on Sinemet 25--100 mg 3  times a day.  he returns today for an evaluation.  REVIEW OF SYSTEMS: Out of a complete 14 system review of symptoms, the patient complains only of the following symptoms, and all other reviewed systems are negative  Epworth sleepiness score 4 Fatigue severity score 15  ALLERGIES: No Known Allergies  HOME MEDICATIONS: Outpatient Medications Prior to Visit  Medication Sig Dispense Refill  . aspirin EC 81 MG tablet Take 81 mg by mouth daily.    . carbidopa-levodopa (SINEMET IR) 25-100 MG tablet TAKE 1 TAB 3 XDAILY TAKE 4-5HRS APART FOR EXAMPLE 8AM 12 & 4PM OR SIMILAR DO NOT EAT PROTEIN 30 MIM 270 tablet 1  . clonazePAM (KLONOPIN) 0.5 MG tablet TAKE 1/2 TO 1 TAB BY MOUTH AT BEDTIME FOR SLEEP 30 tablet 5  . fluticasone (FLONASE) 50 MCG/ACT nasal spray Place 2 sprays into the nose every morning.    . metoprolol  succinate (TOPROL-XL) 25 MG 24 hr tablet Take 0.5 tablets (12.5 mg total) by mouth daily. 45 tablet 3  . Multiple Vitamins-Minerals (MULTIVITAMIN PO) Take by mouth.    . Polyethylene Glycol 3350 (MIRALAX PO) Take by mouth daily.     . sildenafil (REVATIO) 20 MG tablet Take 20 mg by mouth 3 (three) times daily as needed.     No facility-administered medications prior to visit.    PAST MEDICAL HISTORY: Past Medical History:  Diagnosis Date  . A-fib ([place])    isolated episode during colonoscopy  . Allergies   . Back pain   . Bladder cancer ([place]) dx aug'12   s/p turbt  . Enlarged prostate with urinary obstruction    nocturia/ freq/ dysuria  . GERD (gastroesophageal reflux disease)   . OSA on CPAP    wears CPAP nightly  . PD (Parkinson's disease) ([place])   . Skin cancer of nose 05/2018   basal cell    PAST SURGICAL HISTORY: Past Surgical History:  Procedure Laterality Date  . CERVICAL FUSION  2000   C4-5  . COLONOSCOPY    . CYSTOSCOPY  06/16/2011   Procedure: CYSTOSCOPY;  Surgeon: Molli Hazard, MD;  [facility];  Service: Urology;  Laterality: N/A;  . CYSTOSCOPY WITH URETHRAL DILATATION  w/ TURBT BX   05-04-11  . INGUINAL HERNIA REPAIR Bilateral 02/07/2019   Procedure: BILATERAL INGUINAL HERNIA REPAIR WITH MESH;  Surgeon: Donnie Mesa, MD;  Location: [facility];  Service: General;  Laterality: Bilateral;  GENERAL AND TAP BLOCK ANESTHESIA  . INSERTION OF MESH Bilateral 02/07/2019   Procedure: INSERTION OF MESH;  Surgeon: Donnie Mesa, MD;  Location: [facility];  Service: General;  Laterality: Bilateral;  . SINUS SURGERY    . TRANSURETHRAL RESECTION OF PROSTATE  06/16/2011   Procedure: TRANSURETHRAL RESECTION OF THE PROSTATE (TURP);  Surgeon: Molli Hazard, MD;  [facility];  Service: Urology;  Laterality: N/A;  URETHRAL DILATATION, MEATOTOMY, PENIL BLOCK    FAMILY HISTORY: Family  History  Problem Relation Age of Onset  . Alzheimer's disease Mother   . Stroke Mother   . Alzheimer's disease Maternal Uncle   . Parkinson's disease Neg Hx     SOCIAL HISTORY: Social History   Socioeconomic History  . Marital status: Soil scientist    Spouse name: Not on file  . Number of children: 0  . Years of education: Not on file  . Highest education level: Master's degree (e.g., MA, MS, MEng, MEd, MSW, MBA)  Occupational History  .  Occupation: Band teacher-Retired  Tobacco Use  . Smoking status: Former Smoker    Types: Cigarettes    Quit date: 09/1985    Years since quitting: 34.9  . Smokeless tobacco: Never Used  Vaping Use  . Vaping Use: Never used  Substance and Sexual Activity  . Alcohol use: Yes    Alcohol/week: 14.0 standard drinks    Types: 14 Cans of beer per week    Comment: 2 beers daily  . Drug use: Never  . Sexual activity: Not on file  Other Topics Concern  . Not on file  Social History Narrative   Lives at home with partner   Right handed   Caffeine: 2 cups of coffee each morning    Social Determinants of Health   Financial Resource Strain: Not on file  Food Insecurity: Not on file  Transportation Needs: Not on file  Physical Activity: Not on file  Stress: Not on file  Social Connections: Not on file  Intimate Partner Violence: Not on file      PHYSICAL EXAM  Vitals:   07/21/20 1010  BP: (!) 141/69  Pulse: (!) 51  Weight: 180 lb (81.6 kg)  Height: 6\' 4"  (1.93 m)   Body mass index is 21.91 kg/m.   MMSE - Mini Mental State Exam 05/23/2018  Orientation to time 5  Orientation to Place 5  Registration 3  Attention/ Calculation 5  Recall 3  Language- name 2 objects 2  Language- repeat 1  Language- follow 3 step command 3  Language- read & follow direction 1  Write a sentence 1  Copy design 1  Total score 30     Generalized: Well developed, in no acute distress   Neurological examination  Mentation: Alert oriented to  time, place, history taking. Follows all commands speech and language fluent Cranial nerve II-XII: Pupils were equal round reactive to light. Extraocular movements were full, visual field were full on confrontational test. Facial sensation and strength were normal. Uvula tongue midline. Head turning and shoulder shrug  were normal and symmetric. Motor: The motor testing reveals 5 over 5 strength of all 4 extremities. Good symmetric motor tone is noted throughout.  Finger taps mildly impaired bilaterally.  Toe taps mildly impaired bilaterally. Sensory: Sensory testing is intact to soft touch on all 4 extremities. No evidence of extinction is noted.  Coordination: Cerebellar testing reveals good finger-nose-finger and heel-to-shin bilaterally.  Gait and station: Patient able to stand without assistance.  Good stride.  Slightly decreased arm swing bilaterally.  4 steps with  turns. Reflexes: Deep tendon reflexes are symmetric and normal bilaterally.   DIAGNOSTIC DATA (LABS, IMAGING, TESTING) - I reviewed patient records, labs, notes, testing and imaging myself where available.  Lab Results  Component Value Date   HGB 13.9 06/16/2011   HCT 41.0 06/16/2011      Component Value Date/Time   NA 146 (H) 05/23/2018 1453   K 4.5 05/23/2018 1453   CL 106 05/23/2018 1453   CO2 25 05/23/2018 1453   GLUCOSE 84 05/23/2018 1453   GLUCOSE 127 (H) 06/16/2011 1430   BUN 16 05/23/2018 1453   CREATININE 0.92 05/23/2018 1453   CALCIUM 8.9 05/23/2018 1453   GFRNONAA 83 05/23/2018 1453   GFRAA 96 05/23/2018 1453      ASSESSMENT AND PLAN 74 y.o. year old male  has a past medical history of A-fib ([place]), Allergies, Back pain, Bladder cancer ([place]) (dx aug'12), Enlarged prostate with urinary obstruction, GERD (gastroesophageal reflux disease),  OSA on CPAP, PD (Parkinson's disease) ([place]), and Skin cancer of nose (05/2018). here with :  Obstructive sleep apnea on CPAP   Good compliance  Good treatment of  apnea  Encouraged use CPAP nightly and greater than 4 hours each night  Parkinson's disease   Symptoms stable  Continue Sinemet IR 25-100 mg 3 times a day  Memory disturbance  MMSE 23/30  Referral for neuropsychology for evaluation and comparison to previous exam.  Previous neuropsychological evaluation showed mild memory disturbance consistent with Parkinson's disease.  Advised patient that if his symptoms worsen or he develops new symptoms he should let us know and follow-up in 6 months or sooner if needed   I spent 30 minutes of face-to-face and non-face-to-face time with patient.  This included previsit chart review, lab review, study review, order entry, electronic health record documentation, patient education.  Ward Givens, MSN, NP-C 07/21/2020, 11:05 AM [facility] [address], [place], [place] [address] [phone]  Made any corrections needed, and agree with history, physical, neuro exam,assessment and plan as stated.     Sarina Ill, MD [facility]

## 2020-07-21 NOTE — Telephone Encounter (Signed)
Pt came to office.

## 2020-07-21 NOTE — Patient Instructions (Signed)
Your Plan:  Continue Sinemet Continue CPAP If your symptoms worsen or you develop new symptoms please let us know.    Thank you for coming to see [place] at [facility]. I hope we have been able to provide you high quality care today.  You may receive a patient satisfaction survey over the next few weeks. We would appreciate your feedback and comments so that we may continue to improve ourselves and the health of our patients.

## 2020-07-30 DIAGNOSIS — M5136 Other intervertebral disc degeneration, lumbar region: Secondary | ICD-10-CM | POA: Diagnosis not present

## 2020-07-30 DIAGNOSIS — M5134 Other intervertebral disc degeneration, thoracic region: Secondary | ICD-10-CM | POA: Diagnosis not present

## 2020-07-30 DIAGNOSIS — M9902 Segmental and somatic dysfunction of thoracic region: Secondary | ICD-10-CM | POA: Diagnosis not present

## 2020-07-30 DIAGNOSIS — M9903 Segmental and somatic dysfunction of lumbar region: Secondary | ICD-10-CM | POA: Diagnosis not present

## 2020-10-03 DIAGNOSIS — G4733 Obstructive sleep apnea (adult) (pediatric): Secondary | ICD-10-CM | POA: Diagnosis not present

## 2020-10-13 ENCOUNTER — Other Ambulatory Visit: Payer: Self-pay | Admitting: Adult Health

## 2020-10-14 ENCOUNTER — Encounter: Payer: Medicare PPO | Attending: Psychology | Admitting: Psychology

## 2020-10-14 ENCOUNTER — Encounter: Payer: Self-pay | Admitting: Psychology

## 2020-10-14 ENCOUNTER — Other Ambulatory Visit: Payer: Self-pay

## 2020-10-14 DIAGNOSIS — G20 Parkinson's disease: Secondary | ICD-10-CM

## 2020-10-14 DIAGNOSIS — R413 Other amnesia: Secondary | ICD-10-CM | POA: Diagnosis not present

## 2020-10-14 NOTE — Progress Notes (Signed)
10/14/2020: 8 AM/9 AM: Today's visit was an in person visit that was conducted in my outpatient clinic office with the patient and his son present.  I had seen the patient back in 2020 for neuropsychological evaluation with a diagnosis of Parkinson's dementia and memory loss.  The patient has been followed through [facility] primarily by Ward Givens, NP and the patient's son has been noting things progression in memory loss and other symptoms.  She suggested that we do repeat testing to assess for progressive changes in cognition and other functioning.  During today's clinical interview the patient reports that he has been doing well from a gait standpoint and is responding well to medication changes.  However, the patient's son reports that memory has been progressively worsened although most of the memory issues described appear to be primarily retrieval deficits then a true inability to store new information.  The patient is doing well with his gait and remaining active.  The patient is described as having increasing difficulties with keeping up with numbers, dates, and times and the son does most of their bills at home but the patient continues to try to make personal bill payment as well as keeping up with the bills for his beach house.  There have been some times where he has forgotten to pay bills but just a couple of times.  The patient is not cooking as much as he used to and used to be an excellent cook but tends to get confused while cooking and now that they have moved into a new house with a different cooking stove has trouble managing the new cooking stove.  The patient is also described as having significant obsessive thinking and the son gave an example where the patient was anxious about his doctor's appointment today and woke up multiple times last night and woke up his son about his concerns of getting to the appointment appropriately.  The patient was ultimately up at 5 or  6 this a.m. to get ready to be here for his appointment.  Progressive cognitive changes are noted, increases in obsessive thinking and anxiety but somewhat stable as far as tremors and gait changes.  We have set the patient up for follow-up neuropsychological evaluation and will repeat the previous neuropsychological test battery.

## 2020-10-20 DIAGNOSIS — Z20822 Contact with and (suspected) exposure to covid-19: Secondary | ICD-10-CM | POA: Diagnosis not present

## 2020-10-20 DIAGNOSIS — Z03818 Encounter for observation for suspected exposure to other biological agents ruled out: Secondary | ICD-10-CM | POA: Diagnosis not present

## 2020-10-30 ENCOUNTER — Other Ambulatory Visit: Payer: Self-pay | Admitting: Adult Health

## 2020-11-06 DIAGNOSIS — G4733 Obstructive sleep apnea (adult) (pediatric): Secondary | ICD-10-CM | POA: Diagnosis not present

## 2020-12-12 ENCOUNTER — Encounter: Payer: Self-pay | Admitting: Psychology

## 2020-12-12 ENCOUNTER — Other Ambulatory Visit: Payer: Self-pay

## 2020-12-12 ENCOUNTER — Encounter: Payer: Medicare PPO | Attending: Psychology | Admitting: Psychology

## 2020-12-12 DIAGNOSIS — G20 Parkinson's disease: Secondary | ICD-10-CM | POA: Diagnosis not present

## 2020-12-12 NOTE — Progress Notes (Addendum)
Neuropsychology Note  Ryan Woodward completed 240 minutes of neuropsychological testing with this provider. The patient did not appear overtly distressed by the testing session, per behavioral observation or via self-report. Rest breaks were offered.   Clinical Decision Making: Repeat testing battery from baseline evaluation 12/29/2018. Changes were made as deemed necessary based on patient performance on testing, my observations, and additional pertinent factors such as those listed above.  Tests Administered: Finger Tapping Test Grooved Pegboard Hand Dynamometer  Wechsler Adult Intelligence Scale, 4th Edition (WAIS-IV) Wechsler Memory Scale, 4th Edition (WMS-IV); Older Adult Battery   Results: (Full results to be included once scored)      Raw T %tile Description  Grooved Pegboard 2022   Dominant   180" 20 <1 Exceptionally Low    Non-Dominant  189" 25 1 Exceptionally Low  2020  Dominant   136" 32 4 Well Below Average  Non-Dominant  138" 33 5 Well Below Average  Hand Dynamometer  2022  Dominant   23 Kg -- -- Below Average    Non-Dominant  21 Kg -- -- Below Average 2020  Dominant   31 Kg  -- -- Average  Non-Dominant     30 Kg -- -- Average  Finger Tapping Test  2022  Dominant   54 56 73 Average  Non-Dominant  44 47 38 Average 2020  Dominant   58  65 93 Well Above Average   Non-Dominant  53 63 91 Well Above Average *Average in 5 trials     Composite Score Summary  Scale Composite Score 2020 (%tile) Composite Score 2022 (%tile) Reliable Change? Y/N Qualitative Description 2022  Verbal Comprehension 103 (58) 98 (45) No Average  Perceptual Reasoning 94 (34) 81 (10) Yes Below Average  Working Memory 105 (63) 80 (9) Yes Below Average  Processing Speed 89 (23) 65 (1) Yes Exceptionally low   Full Scale 98 (45) 79 (8) Yes Well Below Average  General Ability 99 (47) 88 (21) Yes Below Average    WMS-IV (Older Adult) Index Score Summary  Index Index Score 2020  (%tile) Index Score  2020 (%tile) Reliable Change? Y/N Qualitative Descriptor 2022  Auditory Memory (AMI) 91 (27) 81 (10) Yes Below Average  Visual Memory (VMI) 69 (2) 73 (4) No Well Below Average  Immediate Memory (IMI) 81 (10) 71 (3) Yes Below Average  Delayed Memory (DMI) 83 (13) 81 (10)  No Below Average    Subtest Scaled Score 2020 (%tile) Scaled Score 2022 (%tile) Reliable Change? Qualitative Description 2022  Logical Memory I 8 (25) 5 (5) Yes Well Below Average  Logical Memory II 7 (16) 6 (9) No Below Average  Verbal Paired Associates I 9 (37) 8 (25) No Average  Verbal Paired Associates II 10 (5) 8 (25) No Average  Visual Reproduction I 4 (2) 3 (1) No Exceptionally Low   Visual Reproduction II 5 (5) 7 (16) No Below Average  Symbol Span 6 5 (5) No Well Below Average    Auditory Memory Process Score Summary  Process Score Raw Score (Base Rate) 2020 Raw Score 2022 Reliable Change? Y/N Qualitative Description 2022  LM II Recognition 17 (26-50) 16 (17-25) No Below Average  VPA II Recognition 27 (26-50) 23 (10-16) No Below Average    Visual Memory Process Score Summary  Process Score Raw Score  (Base Rate) 2020 Raw Score (Base Rate) 2022 Reliable Change? Y/N Qualitative Description 2022  VR II Recognition 2 (10-16) 3 (17-25) No Below Average    ABILITY-MEMORY  ANALYSIS (2020)   Ability Score:  GAI: 99 Date of Testing:  WAIS-IV; WMS-IV 2018/12/29  Predicted Difference Method   Index Predicted WMS-IV Index Score Actual WMS-IV Index Score Difference Critical Value  Significant Difference Y/N Base Rate  Auditory Memory 99 91 8 10.41 N   Visual Memory 99 69 30 7.89 Y <1%  Immediate Memory 99 81 18 9.97 Y 5-10%  Delayed Memory 99 83 16 12.33 Y 10-15%  Statistical significance (critical value) at the .01 level.    ABILITY-MEMORY ANALYSIS (2022)  Ability Score:  GAI: 88 Date of Testing:  WAIS-IV; WMS-IV 2020/12/12  Predicted Difference Method   Index  Predicted WMS-IV Index Score Actual WMS-IV Index Score Difference Critical Value  Significant Difference Y/N Base Rate  Auditory Memory 94 81 13 10.41 Y 15-20%  Visual Memory 93 73 20 7.89 Y 5-10%  Immediate Memory 92 71 21 9.97 Y 3-4%  Delayed Memory 93 81 12 12.33 N   Statistical significance (critical value) at the .01 level.    Feedback to Patient: Ryan Woodward will return on 01/14/21 for an interactive feedback session with Dr. Sima Matas at which time his test performances, clinical impressions and treatment recommendations will be reviewed in detail. The patient understands he can contact our office should he require our assistance before this time.  Full report to follow.  NOTE: 9 month follow up interview wih Dr. Sima Matas in EMR on 10/14/20

## 2020-12-17 ENCOUNTER — Other Ambulatory Visit: Payer: Self-pay | Admitting: Cardiovascular Disease

## 2021-01-08 ENCOUNTER — Other Ambulatory Visit: Payer: Self-pay

## 2021-01-08 ENCOUNTER — Encounter: Payer: Medicare PPO | Admitting: Psychology

## 2021-01-13 ENCOUNTER — Encounter: Payer: Self-pay | Admitting: Psychology

## 2021-01-13 ENCOUNTER — Encounter: Payer: Medicare PPO | Attending: Psychology | Admitting: Psychology

## 2021-01-13 ENCOUNTER — Other Ambulatory Visit: Payer: Self-pay

## 2021-01-13 DIAGNOSIS — R413 Other amnesia: Secondary | ICD-10-CM

## 2021-01-13 DIAGNOSIS — G20 Parkinson's disease: Secondary | ICD-10-CM | POA: Diagnosis not present

## 2021-01-13 DIAGNOSIS — F4323 Adjustment disorder with mixed anxiety and depressed mood: Secondary | ICD-10-CM | POA: Insufficient documentation

## 2021-01-13 NOTE — Progress Notes (Addendum)
Neuropsychological Evaluation   Patient:  Ryan Woodward   DOB: 05-15-1946  MR Number: [id number]  Location: [facility] [place], [place] [id number] [place] [address] Dept: [phone]  Start: 8 AM End: 9 AM  Provider/Observer:     Edgardo Roys PsyD  Chief Complaint:      Chief Complaint  Patient presents with   Memory Loss   Tremors   Fatigue    Reason For Service:     I had initially seen the patient back in 2020 for neuropsychological evaluation with a diagnosis of Parkinson's dementia and memory loss.  The patient has been followed through [facility] primarily by Ward Givens, NP and the patient's son has been noting changes including progression in memory loss and other symptoms.  The patient's provider asked that we do repeat testing to assess for progressive changes in cognition and other functioning.  The initial clinical background information can be found in the patient's EMR dated 11/27/2018 and complete neuropsychological evaluation that was done initially can be found in the patient's EMR dated 01/11/2019.  In 2020, the neuropsychological results in combination with medical information and subjective reports were consistent with patterns typically seen with Parkinson's disease.  The patient displayed significant fine motor deficits while overall gross motor strength was within normal limits.  There was no lateralization noted and there was also an overall decrease in global cognitive functioning in general.  However, his verbal skills and verbal judgment and reasoning skills were generally well-preserved as well as his auditory encoding abilities.  The patient had his most significant cognitive deficits in the areas of visual spatial skills while visual reasoning and problem-solving were generally well-preserved.  The patient had difficulty  with visual scanning and visual searching as well as speed of mental operations.  While auditory memory was lower than predicted based on his education and other variables they were better preserved than the patient's visual memory and visual-spatial functions suggesting more frontal lobe and parietal lobe involvement than temporal lobe involvement.  Clinical observation also included more stiff gait and the patient describing significant changes in his sense of smell without a loss and primary taste functions including salt, better and sweet etc.  Tremors, changes in gait, loss of smell or early symptoms of Parkinson's type condition.  There were also some word finding and verbal fluency issues, visual-spatial deficits, and overall changes in speed of mental operations.   During today's clinical interview the patient reports that he has been doing well from a gait standpoint and is responding well to medication changes.  However, the patient's son reports that memory has been progressively worsening although most of the memory issues described appear to be primarily retrieval deficits then a true inability to store new information.  The patient is doing well with his gait and remaining active.  The patient is described as having increasing difficulties with keeping up with numbers, dates, and times and the son does most of their bills at home but the patient continues to try to make personal bill payment as well as keeping up with the bills for his [place] house.  There have been some times where he has forgotten to pay bills but just a couple of times.  The patient is not cooking as much as he used to and used to be an excellent cook but tends to get confused while cooking and now that they  have moved into a new house with a different cooking stove has trouble managing the new cooking stove.  The patient has also described the development of significant obsessive thinking and the son gave an example where the  patient was anxious about his doctor's appointment today and woke up multiple times last night and woke up his son about his concerns of getting to the appointment appropriately.  The patient was ultimately up at 5 or 6 this a.m. to get ready to be here for his appointment.  Progressive cognitive changes are noted, increases in obsessive thinking and anxiety but somewhat stable as far as tremors and gait changes.  Patient is also having more auditory hallucinations and while he will perceive someone in the other room talking it has not progressed to a delusional nature.   We have set the patient up for follow-up neuropsychological evaluation and will repeat the previous neuropsychological test battery.  Participation Level:   Active  Participation Quality:  Appropriate      Behavioral Observation:  Well Groomed, Alert, and Appropriate.   Tests Administered: Finger Tapping Test Grooved Pegboard Hand Dynamometer Wechsler Adult Intelligence Scale, 4th Edition (WAIS-IV) Wechsler Memory Scale, 4th Edition (WMS-IV); Older Adult Battery    Test Results:   Below, the results from both the current (2022) evaluation will be presented alongside his 2020 evaluation results for easier comparisons.  The testing in 2020 was conducted on 12/29/2018 and the current evaluation test measures were conducted on 12/13/2018.                                                   Raw     T          %tile    Description Grooved Pegboard 2022              Dominant                     180"     20        <1        Exceptionally Low                                Non-Dominant             189"     25        1          Exceptionally Low  2020             Dominant                     136"     32        4          Well Below Average             Non-Dominant             138"     33        5          Well Below Average   Hand Dynamometer 2022             Dominant  23 Kg   --          --          Below  Average                                     Non-Dominant             21 Kg   --          --          Below Average 2020             Dominant                     31 Kg   --          --          Average             Non-Dominant             30 Kg   --          --          Average   Finger Tapping Test 2022             Dominant                     54        56        73        Average             Non-Dominant             44        47        38        Average 2020             Dominant                     58        65        93        Well Above Average              Non-Dominant             53        63        46        Well Above Average *Average in 5 trials    The patient was administered a number of measures of fine motor control and manual dexterity, hand strength both dominant nondominant as well as motor speed measures of both dominant nondominant index finger.  On the grooved pegboard test which assesses fine motor control and manual dexterity the patient displayed further deterioration and initially there were deficits noted in 2020 but he has shown progressive decline over the past 2 years on measures of fine motor control.  The patient's grip strength was analyzed with a standardized hand dynamometer measure.  In 2020 the patient's dominant nondominant hand grip strength fell in the average range but there has been some loss in motor strength over the past 2 years with his grip strength now falling in the below average range relative to a normative population and indicating about a 30% loss in grip strength from initial testing.  This was present for both dominant and  nondominant hand.  On measures of motor speed the patient performed quite well in 2020 following in the well above average range relative to a normative population.  The patient has decreased somewhat in his motor speed but his performance is now followed in the average range indicating some very mild loss in fine motor speed  bilaterally.        Composite Score Summary  Scale Composite Score 2020 (%tile) Composite Score 2022 (%tile) Reliable Change? Y/N Qualitative Description 2022  Verbal Comprehension 103 (58) 98 (45) No Average  Perceptual Reasoning 94 (34) 81 (10) Yes Below Average  Working Memory 105 (63) 80 (9) Yes Below Average  Processing Speed 89 (23) 65 (1) Yes Exceptionally low   Full Scale 98 (45) 79 (8) Yes Well Below Average  General Ability 99 (47) 88 (21) Yes Below Average    The patient was readministered the Wechsler Adult Intelligence Scale's to provide a very well normed and standardized measure allowing for direct comparisons between 2020 and 2022 performances.  Overall, there was no significant change in the patient's verbal comprehension skills which fell in the average range on both administrations and only represented a 5% loss in overall verbal skills.  This was the only area of cognitive functioning and this battery where the patient maintained performance without any statistically significant loss of functioning.  On global measures including full-scale IQ score and general abilities index scores the patient showed statistically significant losses globally.  The patient's initial performance was in the average range and while significantly below predicted levels based on his excellent education and occupational history now falls in the well below average range relative to a normative population.  The patient had a 20 point loss in his full-scale IQ score comparing these to assessment dates.  The patient showed less but still's clinically significant loss in his general abilities index score which places less emphasis on working memory and information processing speed which are his to lowest areas of functioning.  All the patient's verbal comprehension skills generally maintained average levels of performance relative to a normative population, the patient showed his most profound and  significant loss of function in the areas of auditory encoding and working memory as well as measures of information processing speed and visual scanning, visual searching, and overall speed of mental operations.  The patient did show a mild to moderate loss in areas of visual spatial and perceptual reasoning and visual analysis and organizational skills.  All of the global composite scores showed statistically significant change/loss over the past 2 years with the exception of his verbal comprehension skills.        WMS-IV (Older Adult) Index Score Summary  Index Index Score 2020 (%tile) Index Score  2022 (%tile) Reliable Change? Y/N Qualitative Descriptor 2022  Auditory Memory (AMI) 91 (27) 81 (10) Yes Below Average  Visual Memory (VMI) 69 (2) 73 (4) No Well Below Average  Immediate Memory (IMI) 81 (10) 71 (3) Yes Below Average  Delayed Memory (DMI) 83 (13) 81 (10) No Below Average    The patient was also readministered the [place] for older adults.  In 2020 the patient was showing some significant memory deficits relative to predicted levels based on his education occupational history both auditory and visual memory continue to be his greatest area of memory deficits but there was little to no further loss in function in his visual memory but they are significantly impaired on both assessment and continue to be generally his  lowest area of memory functioning.  The patient showed ongoing and further loss with regard to his immediate memory functions but his delayed memory function remained essentially the same with no further progression.   Subtest Scaled Score 2020 (%tile) Scaled Score 2022 (%tile) Reliable Change? Qualitative Description 2022  Logical Memory I 8 (25) 5 (5) Yes Well Below Average  Logical Memory II 7 (16) 6 (9) No Below Average  Verbal Paired Associates I 9 (37) 8 (25) No Average  Verbal Paired Associates II 10 (5) 8 (25) No Average  Visual Reproduction I  4 (2) 3 (1) No Exceptionally Low   Visual Reproduction II 5 (5) 7 (16) No Below Average  Symbol Span 6 5 (5) No Well Below Average            Auditory Memory Process Score Summary    Process Score Raw Score (Base Rate) 2020 Raw Score 2022 Reliable Change? Y/N Qualitative Description 2022  LM II Recognition 17 (26-50) 16 (17-25) No Below Average  VPA II Recognition 27 (26-50) 23 (10-16) No Below Average            Visual Memory Process Score Summary   Process Score Raw Score  (Base Rate) 2020 Raw Score (Base Rate) 2022 Reliable Change? Y/N Qualitative Description 2022  VR II Recognition 2 (10-16) 3 (17-25) No Below Average      ABILITY-MEMORY ANALYSIS (2020)    Ability Score:    GAI: 99 Date of Testing:           WAIS-IV; WMS-IV 2018/12/29            Predicted Difference Method    Index Predicted WMS-IV Index Score Actual WMS-IV Index Score Difference Critical Value   Significant Difference Y/N Base Rate  Auditory Memory 99 91 8 10.41 N    Visual Memory 99 69 30 7.89 Y <1%  Immediate Memory 99 81 18 9.97 Y 5-10%  Delayed Memory 99 83 16 12.33 Y 10-15%  Statistical significance (critical value) at the .01 level.       ABILITY-MEMORY ANALYSIS (2022)   Ability Score:    GAI: 88 Date of Testing:           WAIS-IV; WMS-IV 2020/12/12             Predicted Difference Method    Index Predicted WMS-IV Index Score Actual WMS-IV Index Score Difference Critical Value   Significant Difference Y/N Base Rate  Auditory Memory 94 81 13 10.41 Y 15-20%  Visual Memory 93 73 20 7.89 Y 5-10%  Immediate Memory 92 71 21 9.97 Y 3-4%  Delayed Memory 93 81 12 12.33 N    Statistical            Summary of Results:   Overall, the results of the current neuropsychological evaluation with direct comparisons to the evaluation conducted in 2020 do show further progression in a number of significant cognitive and motor functioning domains.  The patient is showing continued and  progressive loss in fine motor control variables including manual dexterity and manual fine motor control as well as a 30% loss in grip strength bilaterally.  The patient has maintained for the most part his fine motor speed and while there has been some mild decreases in motor speed his performance over the past 2 years has shown progression from well above average to average relative to a normative population.  The patient is displaying statistically significant loss in global cognitive functioning variables with generally well-preserved  verbal skills displaying only slight loss relative to his own performance and still performing in the average range (which is significantly below predicted levels based on his educational and occupational history).  Patient is showing significant loss with regard to visual-spatial, visual analysis, visual constructional and visual reasoning skills.  Greater and even more significant progression was found with regard to auditory and visual encoding capacity and there were also significant deficits and further loss with regard to information processing speed and overall speed of mental operations.  As far as specific memory functions, the patient is showing overall decrease in memory and learning capacity.  While there was no statistically significant change between the 2 assessments with regard to visual memory both his 2020 and 2022 assessment showed significant and profound deficits with regard to visual memory functions both relative to a normative population as well as relative to his predicted levels of performance based on education occupational history.  The patient's biggest area of change in auditory memory, which showed an overall significant loss relative to 2020 assessment was related to initially learning new auditory information versus his delayed auditory learning and memory.  The patient shows significant deficits in memory with regard to encoding, storage and  organization of new information with greatest deficits in the area of visual memory and learning versus auditory memory and learning.  The patient does show some mild improvements with regard to cueing and recognition formats with suggest that retrieval is also playing a role in his memory deficits but not the primary or sole role in his memory deficits.  Impression/Diagnosis:   Overall, the results of the current neuropsychological evaluation do continue to show patterns very consistent with a primary progressive dementia of the Parkinson's type with significant motor deficits that are progressing.  The patient reports that he had a positive response to medication strategies addressing his tremors and balance issues but there have been further progression with regard to fine motor control, hand muscle strength and some mild reduction in motor speed.  They have been further progressive loss with regard to global cognitive functioning.  The patient continues to have generally well-preserved verbal skills and verbal comprehension abilities but is showing progressive and significant loss in auditory and visual encoding, visual spatial and perceptual reasoning capacities and significant loss in overall information processing speed specifically.  While there are no progressive loss or changes with regard to visual memory these areas were already severely and profoundly impaired initially and continue to show the same significant deficits.  The patient appears to continue with his mild to moderate impairments with regard to delayed memory functions but they are generally stable since the initial testing.  Memory deficits are most significant for visual memory but are also present for auditory memory and learning with deficits in memory functions related to encoding, storage, organization and to some degree retrieval deficits.  As far as treatment recommendations, it is important that the patient continue to follow-up  with neurology as this is a primarily neurological area of expertise.  The patient does not appear to be severely depressed and is not showing any specific psychiatric changes but has had a significant loss in overall functioning.  He has good support from his family and they are helping him with day-to-day functions including bill payments etc.  At some point it is likely that the patient's son will need to take over financial management of his beach property as well as there do continue to be significant and progressing memory changes.  Given the level of visual memory deficits it would also be important for the patient to stop independently cooking as that presents some degree of hazard although cooking with someone else around would protect and provide safety oversight.  Absolutely, the patient should not be driving and should avoid situations where his motor deficits and disturbance in gait would produce significant safety concerns.  The patient is also having increasing level of auditory hallucinations and may be related to increased sleep disturbance.  We should look at possible medication management as it does cause some degree of distress.    I do remain available for the patient and his family if they have further questions as this is a progressive condition and will continue to have significant impacts on the patient but also his family and adapting and adjusting to these progressive changes.  I will sit down with the patient and his family to go over the results of the current neuropsychological evaluation and provide any more in-depth and individualized recommendations.  Diagnosis:    Parkinson's disease ([place])  Memory loss   _____________________ Ilean Skill, Psy.D. Clinical Neuropsychologist

## 2021-01-14 ENCOUNTER — Encounter: Payer: Medicare PPO | Admitting: Psychology

## 2021-01-14 ENCOUNTER — Other Ambulatory Visit: Payer: Self-pay

## 2021-01-14 ENCOUNTER — Encounter: Payer: Self-pay | Admitting: Psychology

## 2021-01-14 DIAGNOSIS — R413 Other amnesia: Secondary | ICD-10-CM | POA: Diagnosis not present

## 2021-01-14 DIAGNOSIS — G20 Parkinson's disease: Secondary | ICD-10-CM | POA: Diagnosis not present

## 2021-01-14 DIAGNOSIS — F4323 Adjustment disorder with mixed anxiety and depressed mood: Secondary | ICD-10-CM

## 2021-01-14 NOTE — Progress Notes (Signed)
01/14/2021 8 AM-9 AM:  Today's visit was an in person visit that was conducted in my outpatient clinic office.  The patient and his son were present for this feedback session as we reviewed the results of the recent follow-up neuropsychological evaluation.  The patient's complete evaluation can be found in his EMR dated 01/13/2021 and I will include the impression in summary and diagnostic considerations below from that evaluation for convenience.  We reviewed the results of the evaluation and identified an addressed the areas of progression in his Parkinson's disease.  The patient does appear to be responding well to medications to manage his tremors and while they were present they were not significant.  The patient's son reports that his older brother has Parkinson's as well and the motor tremors and the patient's brother are much more severe.  The patient is having worsening function with regard to memory and other cognitive and motor functions as identified in the report so there is clear progression over the past 2 years.    Summary of Results:                        Overall, the results of the current neuropsychological evaluation with direct comparisons to the evaluation conducted in 2020 do show further progression in a number of significant cognitive and motor functioning domains.  The patient is showing continued and progressive loss in fine motor control variables including manual dexterity and manual fine motor control as well as a 30% loss in grip strength bilaterally.  The patient has maintained for the most part his fine motor speed and while there has been some mild decreases in motor speed his performance over the past 2 years has shown progression from well above average to average relative to a normative population.  The patient is displaying statistically significant loss in global cognitive functioning variables with generally well-preserved verbal skills displaying only slight loss  relative to his own performance and still performing in the average range (which is significantly below predicted levels based on his educational and occupational history).  Patient is showing significant loss with regard to visual-spatial, visual analysis, visual constructional and visual reasoning skills.  Greater and even more significant progression was found with regard to auditory and visual encoding capacity and there were also significant deficits and further loss with regard to information processing speed and overall speed of mental operations.  As far as specific memory functions, the patient is showing overall decrease in memory and learning capacity.  While there was no statistically significant change between the 2 assessments with regard to visual memory both his 2020 and 2022 assessment showed significant and profound deficits with regard to visual memory functions both relative to a normative population as well as relative to his predicted levels of performance based on education occupational history.  The patient's biggest area of change in auditory memory, which showed an overall significant loss relative to 2020 assessment was related to initially learning new auditory information versus his delayed auditory learning and memory.  The patient shows significant deficits in memory with regard to encoding, storage and organization of new information with greatest deficits in the area of visual memory and learning versus auditory memory and learning.  The patient does show some mild improvements with regard to cueing and recognition formats with suggest that retrieval is also playing a role in his memory deficits but not the primary or sole role in his memory deficits.   Impression/Diagnosis:  Overall, the results of the current neuropsychological evaluation do continue to show patterns very consistent with a primary progressive dementia of the Parkinson's type with significant  motor deficits that are progressing.  The patient reports that he had a positive response to medication strategies addressing his tremors and balance issues but there have been further progression with regard to fine motor control, hand muscle strength and some mild reduction in motor speed.  They have been further progressive loss with regard to global cognitive functioning.  The patient continues to have generally well-preserved verbal skills and verbal comprehension abilities but is showing progressive and significant loss in auditory and visual encoding, visual spatial and perceptual reasoning capacities and significant loss in overall information processing speed specifically.  While there are no progressive loss or changes with regard to visual memory these areas were already severely and profoundly impaired initially and continue to show the same significant deficits.  The patient appears to continue with his mild to moderate impairments with regard to delayed memory functions but they are generally stable since the initial testing.  Memory deficits are most significant for visual memory but are also present for auditory memory and learning with deficits in memory functions related to encoding, storage, organization and to some degree retrieval deficits.  As far as treatment recommendations, it is important that the patient continue to follow-up with neurology as this is a primarily neurological area of expertise.  The patient does not appear to be severely depressed and is not showing any specific psychiatric changes but has had a significant loss in overall functioning.  He has good support from his family and they are helping him with day-to-day functions including bill payments etc.  At some point it is likely that the patient's son will need to take over financial management of [place] as well as there do continue to be significant and progressing memory changes.  Given the level of visual  memory deficits it would also be important for the patient to stop independently cooking as that presents some degree of hazard although cooking with someone else around would protect and provide safety oversight.  Absolutely, the patient should not be driving and should avoid situations where his motor deficits and disturbance in gait would produce significant safety concerns.  The patient is also having increasing level of auditory hallucinations and may be related to increased sleep disturbance.  We should look at possible medication management as it does cause some degree of distress.    I do remain available for the patient and his family if they have further questions as this is a progressive condition and will continue to have significant impacts on the patient but also his family and adapting and adjusting to these progressive changes.  I will sit down with the patient and his family to go over the results of the current neuropsychological evaluation and provide any more in-depth and individualized recommendations.   Diagnosis:                                Parkinson's disease ([place])   Memory loss  Adjustment disorder with mixed anxiety and depressive responses with significant anxiety type symptoms.     _____________________ Ilean Skill, Psy.D. Clinical Neuropsychologist

## 2021-01-24 ENCOUNTER — Other Ambulatory Visit: Payer: Self-pay | Admitting: Cardiovascular Disease

## 2021-01-26 ENCOUNTER — Ambulatory Visit: Payer: Medicare PPO | Admitting: Neurology

## 2021-01-26 ENCOUNTER — Encounter: Payer: Self-pay | Admitting: Neurology

## 2021-01-26 VITALS — BP 143/83 | HR 51 | Ht 76.0 in | Wt 178.0 lb

## 2021-01-26 DIAGNOSIS — G20 Parkinson's disease: Secondary | ICD-10-CM | POA: Diagnosis not present

## 2021-01-26 DIAGNOSIS — G4733 Obstructive sleep apnea (adult) (pediatric): Secondary | ICD-10-CM

## 2021-01-26 DIAGNOSIS — R413 Other amnesia: Secondary | ICD-10-CM | POA: Diagnosis not present

## 2021-01-26 DIAGNOSIS — G4752 REM sleep behavior disorder: Secondary | ICD-10-CM | POA: Diagnosis not present

## 2021-01-26 DIAGNOSIS — Z9989 Dependence on other enabling machines and devices: Secondary | ICD-10-CM

## 2021-01-26 NOTE — Patient Instructions (Addendum)
For constipation: try the "prune juice cocktail". Mix 1/2 cup applesauce, 2 tablespoons of miller's bran, 4-6 oz. prune juice. Store in [employer]. Take a tablespoonful per day at first, gradually increasing until you find the amount that works best. Most people find this mixture tasty.  Would like to start Rivastigmine which helps with slow down of memory loss - will contact cardiology  Hallucinations: Monitor. Can start Seroquel very low dose if needed.   Rem sleep disorder: Stable on clonazepam  Stable physically, continue current dose of Carbidopa/levodopa  For saliva: If it becomes a problem let me know  Recommend PT/OT and social worker (will see if social worker still available) - Let me know  Consider speech therapy if needed - let me know  Regular skin checks  Continue Cpap use   Parkinson's Disease Parkinson's disease is a type of movement disorder. It is a long-term condition that gets worse over time (is progressive). Each person with Parkinson's disease is affected differently. This condition limits your ability to control movements and move your body normally. The condition can range from mild to severe. Parkinson's diseasetends to get worse slowly over several years. What are the causes? Parkinson's disease results from a loss of brain cells (neurons) that make a brain chemical called dopamine. Dopamine is needed to control movement. As the condition gets worse, neurons make less dopamine. This makesit hard to move or control your movements. The exact cause of the loss of neurons and why they make less dopamine is not known. Factors related to genes and the environment may contribute to the causeof Parkinson's disease. What increases the risk? The following factors may make you more likely to develop this condition: Being male. Being age 45 or older. Having a family history of Parkinson's disease. Having had a traumatic brain injury. Having experienced  depression. Having been exposed to toxins, such as pesticides. What are the signs or symptoms? Symptoms of this condition can vary. The main symptoms are related to movement. These include: A tremor or shaking while you are resting that you cannot control. Stiffness in your neck, arms, and legs (rigidity). Slowing of movement. You may lose facial expressions and have trouble making small movements that are needed to button clothing or brush your teeth. An abnormal walk. You may walk with short, shuffling steps. Loss of balance and stability when standing. You may sway, fall backward, and have trouble making turns. Other symptoms include: Mental or cognitive changes including depression, anxiety, having false beliefs (delusions), or seeing, hearing, or feeling things that do not exist (hallucinations). Trouble speaking or swallowing. Changes in bowel or bladder functions including constipation, having to go urgently or frequently, or not being able to control your bowel or bladder. Changes in sleep habits or trouble sleeping. Parkinson's disease may be graded by severity of your condition as mild, moderate, or advanced. Parkinson's disease progression is different for everyone. You may not progress to the advanced stage. Mild Parkinson's disease involves: Movement problems that do not affect daily activities. Movement problems on one side of the body. Moderate Parkinson's disease involves: Movement problems on both sides of the body. Slowing of movement. Coordination and balance problems. Advanced Parkinson's disease involves: Extreme difficulty walking. Inability to live alone safely. Signs of dementia, such as having trouble remembering things, doing daily tasks such as getting dressed, and problem solving. How is this diagnosed? This condition is diagnosed by a specialist. A diagnosis may be made based on symptoms, your medical history, and a physical  exam. You may also have brainimaging  tests to check for a loss of dopamine-producing areas of the brain. How is this treated? There is no cure for Parkinson's disease. Treatment focuses on managing your symptoms. Treatment may include: Medicines. Everyone responds to medicines differently. Your response may change over time. Work with your health care provider to find the best medicines for you. Speech, occupational, and physical therapy. Deep brain stimulation surgery to reduce tremors and other involuntary movements. Follow these instructions at home: Medicines Take over-the-counter and prescription medicines only as told by your health care provider. Avoid taking medicines that can affect thinking, such as pain or sleeping medicines. Eating and drinking Follow instructions from your health care provider about eating or drinking restrictions. Do not drink alcohol. Activity Talk with your health care provider about if it is safe for you to drive. Do exercises as told by your health care provider or physical therapist. Lifestyle     Install grab bars and railings in your home to prevent falls. Do not use any products that contain nicotine or tobacco, such as cigarettes, e-cigarettes, and chewing tobacco. If you need help quitting, ask your health care provider. Consider joining a support group for people with Parkinson's disease. General instructions Work with your health care provider to determine what you need help with and what your safety needs are. Keep all follow-up visits as told by your health care provider, including any visits with a physical therapist, speech therapist, or occupational therapist. This is important. Contact a health care provider if: Medicines do not help your symptoms. You are unsteady or have fallen at home. You need more support to function well at home. You have trouble swallowing. You have severe constipation. You are having problems with side effects from your medicines. You feel  confused, anxious, or depressed. Get help right away if you: Are injured after a fall. See or hear things that are not real. Cannot swallow without choking. Have chest pain or trouble breathing. Do not feel safe at home. Have thoughts about hurting yourself or others. If you ever feel like you may hurt yourself or others, or have thoughts about taking your own life, get help right away. You can go to your nearest emergency department or call: Your local emergency services (911 in the [place] A suicide crisis helpline, such as the [facility] at (949)835-6174. This is open 24 hours a day. Summary Parkinson's disease is a long-term condition that gets worse over time. This condition limits your ability to control your movements and move your body normally. There is no cure for Parkinson's disease. Treatment focuses on managing your symptoms. Work with your health care provider to determine what you need help with and what your safety needs are. Keep all follow-up visits as told by your health care provider, including any visits with a physical therapist, speech therapist, or occupational therapist. This is important. This information is not intended to replace advice given to you by your health care provider. Make sure you discuss any questions you have with your healthcare provider. Document Revised: 10/05/2018 Document Reviewed: 10/05/2018 [employer] Patient Education  [employer].

## 2021-01-26 NOTE — Progress Notes (Signed)
[facility]    Provider:  Dr Jaynee Eagles Referring Provider: Lawerance Cruel, MD Primary Care Physician:  Lawerance Cruel, MD  CC:  memory loss, +DAT Scan c/w Parkinson's disease spectrum and progressive dementia of the Parkinson's type with significant motor deficits that are progressing.  Interval history January 26, 2021: Patient is here for follow-up on Parkinson's disease.  I last saw him in April 2020 however he seen Ward Givens, NP multiple times since then.  He is on clonazepam for REM sleep disorder.  He recently met again for follow-up of neurocognitive testing, progression in his Parkinson's disease, per Dr.Rodenbough he appears to be responding well to medications to manage his tremors, patient's older brother has Parkinson's as well and the motor tremors the patient's brothers are more severe, there is worsening cognitive function with regard to memory as well as clear progression of motor function in the past several years.  As compared to 2020, current comparisons do show further progression in number of significant cognitive and motor function domains, loss and fine motor control, loss in grip strength bilaterally, loss and global cognitive functioning with generally preserved verbal skills albeit significantly below his predicted levels based on his educational and occupational history.  Also showing significant loss with regard to visual spatial skills, information processing speed and overall speed of mental operations.  Overall decrease in memory and learning capacity.  Does not appear depressed, has good support from his family, it was recommended he stop cooking independently and do not drive.  Here with his partner. Patient says he has reached a plateau. For the most part he is stable. No falls. Walking every day, they go early 1.5 miles a day. His appetite is good. He feels a vitamin gets stuck in his mouth but if broken in half he swallowing, occ will  swallow wrong not often. Son says everything is fine. He has had 4 shots. No significant weight loss. He has been having hallucinations, 4 instances he was talking to someone they were not there, he looks away and looks bad and they are gone. He has seen his son. Not disturbing, he did see his grandson who was not there. He is convinced there are 2 of his son at the house. No delusions. No frustration or agitation. He has constipation and takes miralax (discuss with pcp). Stable on the clonazepam for REM sleep disorder.Tremor is stable, physcially he is stable.He feels he has lotsof salive in his mouth but no drooling. No depression.   Interval history 11/14/2018:  He was diagnosed with OSA and started on cpap. He saw Dr. Carles Collet who suspects he is in the pre-motor phase of Parkinson's Disease. He was referred for Neurocognitive testing at [facility]. Also suggested speech therapy. Clonazepam for REM sleep disorder. He is feeling well. He stammers and gets frustrated. I recommended speech therapy. It has been a little stressful recently because of cancellations he has to deal with and getting people back money. Will refer for speech therapy. He has an appointment for formal neurocognitive testing. I recommended his partner go to every appointment He is on clonazepam and feels his REM sleep disorder is better. He is on a cpap. He still feels like he flails and we can increase the medication as needed. He has no side effects to the Sinemet, he will start taking a whole tablet tid q4hrs. Refer to cardiologist. Will reach out to cardiologist about his afib and asa. I encouraged him to take his partner to all  the appointments, I don;t think he was able to tell me accurately what Dr. Carles Collet had explained to him. He is having swallowing difficulties, coughing, discussed swallow evaluation.  Interval history 08/11/2018: Here with partner.  Patient presented initially to me with concerns of memory loss, exam showed parkinsonism.   DAT scan was consistent with a Parkinson spectrum disorder.  Discussed in detail Parkinson's syndromes, Lewy Body Dementia vs Parkinson's Disease.  Also discussed pathophysiology, possible etiologies, progression of disorder, modifying factors such as starting medication and exercise.  Also provided community resources and on line sites for information.  We will start Sinemet, his partner reports that he has slight confusion so on to stay away from the dopamine agonists and will start Sinemet at this time, also recommended Parkinson specific exercise classes, however Parkinson's another community resources.  We also discussed that there is a Parkinson's clinic in this area with an excellent specialist Wells Guiles Tat and they would like to see her.  HPI:  [facility] is a 75 y.o. male here as requested by Dr. Harrington Challenger for memory problems.  Past medical history of atrial fibrillation, insomnia, memory difficulties, bladder cancer.  He is a former smoker. He feels he has memory problems. He can't think of a world, will remember in a few seconds, been ongoing for 5-6 months. Unclear if it is getting worse. A few times a day. Sometimes he describes the object and he is frustrated and stops. He has a partner for 39 years. His partner doesn't think there is anything wrong. 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 Mother had alzheimers. Mother started having memory issues at the age of 41. Still working and has his own business. He has noticed he goes to the computer to pay bills and he has to stop and think about what to do at the computer. He loses things in the house ongoing for a few months. Driving is fine, not gettig lost or accidents, he usually knows the day and date, he remembers important dates. Possibly after his bladder cancer chemo and surgery in 2013 his partner noticed he started having nightmares, always involves someone chasing him, then he wouldn't remember the dreams, act out the dreams and he talks  gibberish in his sleep he has found his bed sheets on the floor, once he got up and was going to leave in his underwear and his partner stopped him. He notices things on the night stand on the floor. No falls or imbalance. No tremors. Voice is softer. No changes in handwriting. He has constipation and loss of smell.   Reviewed notes, labs and imaging from outside physicians, which showed:  Reviewed notes from referring physician Dr. Harrington Challenger.  Patient feels his memory has been progressively worsening especially in the months prior to last visit which was July 2019.  Memory is gotten worse, dropping words, trouble finishing sentences.  Mother and maternal uncle had Alzheimer's as well as other relatives.  He feels as though he flails in bed, trazodone helps some, has grogginess in the morning.  B12 and thyroid were ordered at last appointment, also CMP was ordered, he takes trazodone at night for insomnia.  TSH 2.9, CMP unremarkable with BUN 13 and creatinine 0.91, vitamin B12 quantity was not sufficient for analysis  Review of Systems: Patient complains of symptoms per HPI as well as the following symptoms: hallucinations . Pertinent negatives and positives per HPI. All others negative    Social History   Socioeconomic History  Marital status: Soil scientist    Spouse name: Not on file   Number of children: 0   Years of education: Not on file   Highest education level: Master's degree (e.g., MA, MS, MEng, MEd, MSW, MBA)  Occupational History   Occupation: Band teacher-Retired  Tobacco Use   Smoking status: Former    Pack years: 0.00    Types: Cigarettes    Quit date: 09/1985    Years since quitting: 35.4   Smokeless tobacco: Never  Vaping Use   Vaping Use: Never used  Substance and Sexual Activity   Alcohol use: Yes    Alcohol/week: 14.0 standard drinks    Types: 14 Cans of beer per week    Comment: 2 beers daily   Drug use: Never   Sexual activity: Not on file  Other Topics  Concern   Not on file  Social History Narrative   Lives at home with partner   Right handed   Caffeine: 2 cups of coffee each morning    Social Determinants of Health   Financial Resource Strain: Not on file  Food Insecurity: Not on file  Transportation Needs: Not on file  Physical Activity: Not on file  Stress: Not on file  Social Connections: Not on file  Intimate Partner Violence: Not on file    Family History  Problem Relation Age of Onset   Alzheimer's disease Mother    Stroke Mother    Alzheimer's disease Maternal Uncle    Parkinson's disease Neg Hx     Past Medical History:  Diagnosis Date   A-fib [facility])    isolated episode during colonoscopy   Allergies    Back pain    Bladder cancer ([place]) dx aug'12   s/p turbt   Enlarged prostate with urinary obstruction    nocturia/ freq/ dysuria   GERD (gastroesophageal reflux disease)    OSA on CPAP    wears CPAP nightly   PD (Parkinson's disease) ([place])    Skin cancer of nose 05/2018   basal cell    Past Surgical History:  Procedure Laterality Date   CERVICAL FUSION  2000   C4-5   COLONOSCOPY     CYSTOSCOPY  06/16/2011   Procedure: CYSTOSCOPY;  Surgeon: Molli Hazard, MD;  Location: [facility];  Service: Urology;  Laterality: N/A;   CYSTOSCOPY WITH URETHRAL DILATATION  w/ TURBT BX   05-04-11   INGUINAL HERNIA REPAIR Bilateral 02/07/2019   Procedure: BILATERAL INGUINAL HERNIA REPAIR WITH MESH;  Surgeon: Donnie Mesa, MD;  Location: [place];  Service: General;  Laterality: Bilateral;  GENERAL AND TAP BLOCK ANESTHESIA   INSERTION OF MESH Bilateral 02/07/2019   Procedure: INSERTION OF MESH;  Surgeon: Donnie Mesa, MD;  Location: [place];  Service: General;  Laterality: Bilateral;   SINUS SURGERY     TRANSURETHRAL RESECTION OF PROSTATE  06/16/2011   Procedure: TRANSURETHRAL RESECTION OF THE PROSTATE (TURP);  Surgeon: Molli Hazard, MD;  Location: [facility];  Service: Urology;  Laterality: N/A;  URETHRAL DILATATION, MEATOTOMY, PENIL BLOCK    Current Outpatient Medications  Medication Sig Dispense Refill   aspirin EC 81 MG tablet Take 81 mg by mouth daily.     carbidopa-levodopa (SINEMET IR) 25-100 MG tablet TAKE 1 TAB 3 XDAILY TAKE 4-5HRS APART FOR EXAMPLE 8AM 12 & 4PM OR SIMILAR DO NOT EAT PROTEIN 30 MIM 270 tablet 1   clonazePAM (KLONOPIN) 0.5 MG tablet TAKE 1/2 TO  1 TAB BY MOUTH AT BEDTIME FOR SLEEP 30 tablet 5   fluticasone (FLONASE) 50 MCG/ACT nasal spray Place 2 sprays into the nose every morning.     metoprolol succinate (TOPROL-XL) 25 MG 24 hr tablet TAKE 1/2 TABLET BY MOUTH EVERY DAY 15 tablet 0   Multiple Vitamins-Minerals (MULTIVITAMIN PO) Take by mouth.     Polyethylene Glycol 3350 (MIRALAX PO) Take by mouth daily.      sildenafil (REVATIO) 20 MG tablet Take 20 mg by mouth 3 (three) times daily as needed.     No current facility-administered medications for this visit.    Allergies as of 01/26/2021   (No Known Allergies)    Vitals: BP (!) 143/83 (BP Location: Right Arm, Patient Position: Sitting)   Pulse (!) 51   Ht '6\' 4"'  (1.93 m)   Wt 178 lb (80.7 kg)   BMI 21.67 kg/m  Last Weight:  Wt Readings from Last 1 Encounters:  01/26/21 178 lb (80.7 kg)   Last Height:   Ht Readings from Last 1 Encounters:  01/26/21 '6\' 4"'  (1.93 m)   Physical exam: stable Exam: Gen: NAD, conversant, well nourised, thin, well groomed                     CV: RRR, no MRG. No Carotid Bruits. No peripheral edema, warm, nontender Eyes: Conjunctivae clear without exudates or hemorrhage  Neuro: Detailed Neurologic Exam  Speech:    Speech is normal; fluent and spontaneous with normal comprehension.  Cognition:  MMSE - Mini Mental State Exam 07/21/2020 05/23/2018  Orientation to time 4 5  Orientation to Place 5 5  Registration 3 3  Attention/ Calculation 2 5  Recall 1 3  Language- name 2 objects 2 2  Language- repeat 1  1  Language- follow 3 step command 2 3  Language- read & follow direction 1 1  Write a sentence 1 1  Copy design 1 1  Total score 23 30       Cranial Nerves: mild hypomimia    The pupils are round, anisocoria right 1m larger but appear to be equally reactive to light. Attempted fundoscopic exam could not visualize.  Visual fields are full to finger confrontation. Mildly impaired upgaze otherwise extraocular movements are intact. Trigeminal sensation is intact and the muscles of mastication are normal. The face is symmetric. The palate elevates in the midline. Hypophonia. Voice is hypophonic. Shoulder shrug is normal. The tongue has normal motion without fasciculations.    Gait:    Heel-toe normal, and tandem gait with imbalance. Decreased arm swing on the right. No freezing. No shuffling but low clearance and narrowed gait.  Motor Observation:    No asymmetry, no atrophy, and no involuntary movements noted. Tone:    Normal muscle tone.  No cogwheeling.   Posture:    Posture is normal. normal erect    Strength:    Strength is V/V in the upper and lower limbs.      Sensation: intact to LT     Reflex Exam:  DTR's: absent AJs. 2+ right patella with crossed reflex, trace left patellar, 1+ biceps   Toes: left down, right equiv     Clonus:    Clonus is absent. Neg hoffman.        Assessment/Plan:  [id number] old with 1st degree FHx of Dementia in mother (Alzheimers?). Parkinsonian signs such as decreased arm swing, hypomimia, hypophonia, REM Sleep disorder, loss of smell and constipation. +DaT scan.  -  DaTscan consistent with Parkinson syndrome.  Had a long discussion about what this means, Lewy body dementia versus Parkinson's disease versus other disorders.  I suspect patient has Parkinson's disease. Appreciate Dr. Doristine Devoid expert second opinion  who believes he is in the pre-motor phase of Parkinson's Disease - Would like to start Rivastigmine which helps with slow down of  memory loss - will contact cardiology bc his pulse is 51, he is on metoprolol, will contact Dr. Angelena Form - For constipation: try the "prune juice cocktail". Mix 1/2 cup applesauce, 2 tablespoons of miller's bran, 4-6 oz. prune juice. Store in Multimedia programmer. Take a tablespoonful per day at first, gradually increasing until you find the amount that works best. Most people find this mixture tasty. -Hallucinations: Monitor. Can start Seroquel very low dose if needed.  -Rem sleep disorder: Stable on clonazepam -Stable physically, continue current dose of Sinemet -For saliva: If it becomes a problem can consider botox or medication .-Diagnosed with OSA here in our office, on cpap now - formal Neurocognitive testing c/w park -Provided information on community resouces - stressed exercise as a way to slow down progression - consider clonazepam for REM sleep disorder as a way to slow down the disease,  - Continue Sinemet. Will stay aware from the [place] because of reports from partner of more confusion in patient - REM sleep disorder: continue Clonazepam - He saw cardiology for afib, he wanted to me follow up and see if they recommend anti-coagulation at this time. Again I suggested he bring his partner to all appointments because I am not sure he is comprehending or retaining everything he is told. - He "stammers" and gets frustrated. I recommended speech therapy. - Again,  encouraged him to take his partner to all the appointments, I don;t think he was able to tell me accurately what was discussed in recent appointments. Left a message for partner on his cell regarding this.  - Difficulty swallowing: swallow and speech eval  Follow Up Instructions:   I discussed the assessment and treatment plan with the patient. The patient was provided an opportunity to ask questions and all were answered. The patient agreed with the plan and demonstrated an understanding of the instructions.   The patient was advised to  call back or seek an in-person evaluation if the symptoms worsen or if the condition fails to improve as anticipated.  A total of over 40 minutes was spent video face-to-face with this patient. Over half this time was spent on counseling patient on the  1. Parkinsonism, unspecified Parkinsonism type ([place])   2. RBD (REM behavioral disorder)   3. OSA on CPAP   4. Memory disturbance     diagnosis and different diagnostic and therapeutic options, counseling and coordination of care, risks ans benefits of management, compliance, or risk factor reduction and education.     Melvenia Beam, MD  No orders of the defined types were placed in this encounter.  No orders of the defined types were placed in this encounter.   Cc: Dr. Lovena Le, [facility] [address] [place], [place] [address]  Phone [phone] Fax [phone]

## 2021-02-03 ENCOUNTER — Other Ambulatory Visit: Payer: Self-pay | Admitting: Cardiovascular Disease

## 2021-02-05 ENCOUNTER — Telehealth: Payer: Self-pay | Admitting: Neurology

## 2021-02-05 NOTE — Telephone Encounter (Signed)
Called pt's partner Vista Deck (on [place]) and LVM with office number asking for call back.

## 2021-02-05 NOTE — Telephone Encounter (Signed)
At last appointment I spoke with son about starting an acetylcholinesterase inhibitor, rivastigmine, which is a medication we use to try and sort of memory loss.  I was concerned however about his pulse which was very low, and these medications can make him lower.  I contacted cardiology, and they said that he is on such a low dose of the metoprolol we could stop the metoprolol and that will help his pulse.  So at this time, if son is okay with it, patient can stop the metoprolol and I can call him in a small dose of rivastigmine or donepezil, both are similar medications and donepezil is dosed once daily so I may go with that one instead of the rivastigmine.  We discussed watching out for GI discomfort and anything that would indicate his pulse is low such as shortness of breath, fatigue, chest pain.  Please let me know and I can place order.  Patient has dementia so I would talk to his family member.

## 2021-02-06 NOTE — Telephone Encounter (Signed)
Pt's partner returned call to [facility]. Please call.

## 2021-02-09 ENCOUNTER — Other Ambulatory Visit: Payer: Self-pay | Admitting: Neurology

## 2021-02-09 MED ORDER — DONEPEZIL HCL 5 MG PO TABS: 5.0000 mg | ORAL_TABLET | Freq: Every day | ORAL | 6 refills | Status: DC

## 2021-02-09 NOTE — Telephone Encounter (Signed)
Vista Deck (on [place]) called me back.  We discussed the message from Dr. Jaynee Eagles.  He would like to start the patient on a medication for memory loss.  He is in agreement to discontinue the metoprolol XL and is aware this was discussed by cardiology and Dr. Jaynee Eagles.  He would like to know if the patient needs to stop the metoprolol for any certain amount of time.  Otherwise he will plan to discontinue the metoprolol now and give the first dose of the memory medication as soon as it is received.  He understands the potential side effects and he also believes the patient will do better with once a day dosing.  His questions were answered and he verbalized appreciation for the call.

## 2021-02-09 NOTE — Telephone Encounter (Signed)
Spoke with Ryan Woodward once more and advised per Dr. Jaynee Eagles to wait 3 days after stopping Metoprolol before patient starts Donepezil.  Advised this is a 5 mg daily dose at bedtime.  The patient has bad dreams can switch to a morning dosing schedule instead.  Also advised we can increase to 10 mg if the patient tolerates it well.  I asked for him to give [place] a call a few weeks after the patient stopped the 5 mg to discuss increasing the dose.  Also again reminded him to watch for GI side effects like diarrhea as this can lead to dehydration and falls.  He verbalized understanding and appreciation for the call.

## 2021-02-12 DIAGNOSIS — G4733 Obstructive sleep apnea (adult) (pediatric): Secondary | ICD-10-CM | POA: Diagnosis not present

## 2021-02-16 DIAGNOSIS — M5134 Other intervertebral disc degeneration, thoracic region: Secondary | ICD-10-CM | POA: Diagnosis not present

## 2021-02-16 DIAGNOSIS — M9903 Segmental and somatic dysfunction of lumbar region: Secondary | ICD-10-CM | POA: Diagnosis not present

## 2021-02-16 DIAGNOSIS — M5136 Other intervertebral disc degeneration, lumbar region: Secondary | ICD-10-CM | POA: Diagnosis not present

## 2021-02-16 DIAGNOSIS — M9904 Segmental and somatic dysfunction of sacral region: Secondary | ICD-10-CM | POA: Diagnosis not present

## 2021-03-04 ENCOUNTER — Other Ambulatory Visit: Payer: Self-pay | Admitting: Cardiovascular Disease

## 2021-03-09 DIAGNOSIS — Z23 Encounter for immunization: Secondary | ICD-10-CM | POA: Diagnosis not present

## 2021-03-09 DIAGNOSIS — K219 Gastro-esophageal reflux disease without esophagitis: Secondary | ICD-10-CM | POA: Diagnosis not present

## 2021-03-09 DIAGNOSIS — Z0000 Encounter for general adult medical examination without abnormal findings: Secondary | ICD-10-CM | POA: Diagnosis not present

## 2021-03-09 DIAGNOSIS — Z131 Encounter for screening for diabetes mellitus: Secondary | ICD-10-CM | POA: Diagnosis not present

## 2021-03-09 DIAGNOSIS — J309 Allergic rhinitis, unspecified: Secondary | ICD-10-CM | POA: Diagnosis not present

## 2021-03-09 DIAGNOSIS — Z79899 Other long term (current) drug therapy: Secondary | ICD-10-CM | POA: Diagnosis not present

## 2021-03-12 DIAGNOSIS — Z0000 Encounter for general adult medical examination without abnormal findings: Secondary | ICD-10-CM | POA: Diagnosis not present

## 2021-03-12 DIAGNOSIS — D6869 Other thrombophilia: Secondary | ICD-10-CM | POA: Diagnosis not present

## 2021-03-12 DIAGNOSIS — J309 Allergic rhinitis, unspecified: Secondary | ICD-10-CM | POA: Diagnosis not present

## 2021-03-12 DIAGNOSIS — I4891 Unspecified atrial fibrillation: Secondary | ICD-10-CM | POA: Diagnosis not present

## 2021-03-12 DIAGNOSIS — Z79899 Other long term (current) drug therapy: Secondary | ICD-10-CM | POA: Diagnosis not present

## 2021-03-12 DIAGNOSIS — R030 Elevated blood-pressure reading, without diagnosis of hypertension: Secondary | ICD-10-CM | POA: Diagnosis not present

## 2021-04-26 ENCOUNTER — Other Ambulatory Visit: Payer: Self-pay | Admitting: Adult Health

## 2021-05-04 ENCOUNTER — Other Ambulatory Visit: Payer: Self-pay | Admitting: Adult Health

## 2021-05-04 NOTE — Telephone Encounter (Signed)
Patient is up to date on his appointments. Pt is due for a refill on klonopin. [place] Controlled Substance Registry checked and is appropriate.

## 2021-05-18 DIAGNOSIS — G4733 Obstructive sleep apnea (adult) (pediatric): Secondary | ICD-10-CM | POA: Diagnosis not present

## 2021-05-19 DIAGNOSIS — C44519 Basal cell carcinoma of skin of other part of trunk: Secondary | ICD-10-CM | POA: Diagnosis not present

## 2021-05-19 DIAGNOSIS — D2262 Melanocytic nevi of left upper limb, including shoulder: Secondary | ICD-10-CM | POA: Diagnosis not present

## 2021-05-19 DIAGNOSIS — C44319 Basal cell carcinoma of skin of other parts of face: Secondary | ICD-10-CM | POA: Diagnosis not present

## 2021-05-19 DIAGNOSIS — L578 Other skin changes due to chronic exposure to nonionizing radiation: Secondary | ICD-10-CM | POA: Diagnosis not present

## 2021-05-19 DIAGNOSIS — L821 Other seborrheic keratosis: Secondary | ICD-10-CM | POA: Diagnosis not present

## 2021-05-19 DIAGNOSIS — D1801 Hemangioma of skin and subcutaneous tissue: Secondary | ICD-10-CM | POA: Diagnosis not present

## 2021-05-19 DIAGNOSIS — L570 Actinic keratosis: Secondary | ICD-10-CM | POA: Diagnosis not present

## 2021-05-19 DIAGNOSIS — D485 Neoplasm of uncertain behavior of skin: Secondary | ICD-10-CM | POA: Diagnosis not present

## 2021-05-19 DIAGNOSIS — L814 Other melanin hyperpigmentation: Secondary | ICD-10-CM | POA: Diagnosis not present

## 2021-06-04 DIAGNOSIS — N400 Enlarged prostate without lower urinary tract symptoms: Secondary | ICD-10-CM | POA: Diagnosis not present

## 2021-06-11 DIAGNOSIS — N400 Enlarged prostate without lower urinary tract symptoms: Secondary | ICD-10-CM | POA: Diagnosis not present

## 2021-06-11 DIAGNOSIS — C672 Malignant neoplasm of lateral wall of bladder: Secondary | ICD-10-CM | POA: Diagnosis not present

## 2021-06-11 DIAGNOSIS — N5201 Erectile dysfunction due to arterial insufficiency: Secondary | ICD-10-CM | POA: Diagnosis not present

## 2021-07-15 DIAGNOSIS — C44519 Basal cell carcinoma of skin of other part of trunk: Secondary | ICD-10-CM | POA: Diagnosis not present

## 2021-07-15 DIAGNOSIS — C44319 Basal cell carcinoma of skin of other parts of face: Secondary | ICD-10-CM | POA: Diagnosis not present

## 2021-07-15 DIAGNOSIS — Z23 Encounter for immunization: Secondary | ICD-10-CM | POA: Diagnosis not present

## 2021-08-10 ENCOUNTER — Telehealth: Payer: Self-pay | Admitting: *Deleted

## 2021-08-10 ENCOUNTER — Ambulatory Visit: Payer: Medicare PPO | Admitting: Neurology

## 2021-08-10 ENCOUNTER — Encounter: Payer: Self-pay | Admitting: Neurology

## 2021-08-10 VITALS — BP 184/94 | HR 51 | Ht 76.0 in | Wt 176.0 lb

## 2021-08-10 DIAGNOSIS — R269 Unspecified abnormalities of gait and mobility: Secondary | ICD-10-CM | POA: Diagnosis not present

## 2021-08-10 DIAGNOSIS — R443 Hallucinations, unspecified: Secondary | ICD-10-CM

## 2021-08-10 DIAGNOSIS — F02A0 Dementia in other diseases classified elsewhere, mild, without behavioral disturbance, psychotic disturbance, mood disturbance, and anxiety: Secondary | ICD-10-CM

## 2021-08-10 DIAGNOSIS — G20 Parkinson's disease: Secondary | ICD-10-CM

## 2021-08-10 DIAGNOSIS — R448 Other symptoms and signs involving general sensations and perceptions: Secondary | ICD-10-CM

## 2021-08-10 MED ORDER — QUETIAPINE FUMARATE 25 MG PO TABS: 25.0000 mg | ORAL_TABLET | Freq: Two times a day (BID) | ORAL | 11 refills | Status: DC

## 2021-08-10 MED ORDER — DONEPEZIL HCL 10 MG PO TABS: 10.0000 mg | ORAL_TABLET | Freq: Every day | ORAL | 4 refills | Status: DC

## 2021-08-10 NOTE — Patient Instructions (Addendum)
Hallucinations:Start with 25mg  seroquel at bedtime may help with sleeping as well Hold off for a few weeks and then start 1/2 pill seroquel during the day and increase to whole pill if needed could increase seroquel or clonazepam at bedtime will monitor Sinemet/Carbidopa-Levodopa: Keep Sinemet same. Medication that helps with tremor and stiffness and walking - monitor can increase as needed Gait abnormality - [facility] Gave packet of community resources in the area for Parkinson's Disease Get regular skin checks Best thing that slows down this disorder is exercise Increase Donepezil to 10mg  Donepezil Disintegrating Tablets What is this medication? DONEPEZIL (doe NEP e zil) treats memory loss and confusion (dementia) in people who have Alzheimer disease. It works by improving attention, memory, and the ability to engage in daily activities. It is not a cure for dementia or Alzheimer disease. This medicine may be used for other purposes; ask your health care provider or pharmacist if you have questions. COMMON BRAND NAME(S): Aricept What should I tell my care team before I take this medication? They need to know if you have any of these conditions: Asthma or other lung disease Difficulty passing urine Head injury Heart disease History of irregular heartbeat Liver disease Seizures (convulsions) Stomach or intestinal disease, ulcers or stomach bleeding An unusual or allergic reaction to donepezil, other medications, foods, dyes, or preservatives Pregnant or trying to get pregnant Breast-feeding How should I use this medication? Take this medication by mouth. Follow the directions on the prescription label. Place the tablet in the mouth and allow it to dissolve, then swallow. While you may take these tablets with water, it is not necessary to do so. You may take this medication with or without food. Take your doses at regular intervals. This medication is usually taken before  bedtime. Do not take your medication more often than directed. Continue to take your medication even if you feel better. Do not stop taking except on the advice of your care team. Talk to your care team about the use of this medication in children. Special care may be needed. Overdosage: If you think you have taken too much of this medicine contact a poison control center or emergency room at once. NOTE: This medicine is only for you. Do not share this medicine with others. What if I miss a dose? If you miss a dose, take it as soon as you can. If it is almost time for your next dose, take only that dose. Do not take double or extra doses. What may interact with this medication? Do not take this medication with any of the following: Certain medications for fungal infections like itraconazole, fluconazole, posaconazole, and voriconazole Cisapride Dextromethorphan; quinidine Dronedarone Pimozide Quinidine Thioridazine This medication may also interact with the following: Antihistamines for allergy, cough and cold Atropine Bethanechol Carbamazepine Certain medications for bladder problems like oxybutynin, tolterodine Certain medications for Parkinson's disease like benztropine, trihexyphenidyl Certain medications for stomach problems like dicyclomine, hyoscyamine Certain medications for travel sickness like scopolamine Dexamethasone Dofetilide Ipratropium NSAIDs, medications for pain and inflammation, like ibuprofen or naproxen Other medications for Alzheimer's disease Other medications that prolong the QT interval (cause an abnormal heart rhythm) Phenobarbital Phenytoin Rifampin, rifabutin or rifapentine Ziprasidone This list may not describe all possible interactions. Give your health care provider a list of all the medicines, herbs, non-prescription drugs, or dietary supplements you use. Also tell them if you smoke, drink alcohol, or use illegal drugs. Some items may interact with  your medicine. What should I watch  for while using this medication? Visit your care team for regular checks on your progress. Check with your care team if your symptoms do not get better or if they get worse. You may get drowsy or dizzy. Do not drive, use machinery, or do anything that needs mental alertness until you know how this medication affects you. What side effects may I notice from receiving this medication? Side effects that you should report to your care team as soon as possible: Allergic reactions--skin rash, itching, hives, swelling of the face, lips, tongue, or throat Peptic ulcer--burning stomach pain, loss of appetite, bloating, burping, heartburn, nausea, vomiting Seizures Slow heartbeat--dizziness, feeling faint or lightheaded, confusion, trouble breathing, unusual weakness or fatigue Stomach bleeding--bloody or black, tar-like stools, vomiting blood or brown material that looks like coffee grounds Trouble passing urine Side effects that usually do not require medical attention (report these to your care team if they continue or are bothersome): Diarrhea Fatigue Loss of appetite Muscle pain or cramps Nausea Trouble sleeping This list may not describe all possible side effects. Call your doctor for medical advice about side effects. You may report side effects to FDA at 1-800-FDA-1088. Where should I keep my medication? Keep out of reach of children. Store at room temperature between 15 and 30 degrees C (59 and 86 degrees F). Throw away any unused medication after the expiration date. NOTE: This sheet is a summary. It may not cover all possible information. If you have questions about this medicine, talk to your doctor, pharmacist, or health care provider.  2022 [employer]vier/Gold Standard (2021-03-04 00:00:00)   Quetiapine Tablets What is this medication? QUETIAPINE (kwe TYE a peen) treats schizophrenia and bipolar disorder. It works by balancing the levels of dopamine and  serotonin in your brain, hormones that help regulate mood, behaviors, and thoughts. It belongs to a group of medications called antipsychotics. Antipsychotic medications can be used to treat several kinds of mental health conditions. This medicine may be used for other purposes; ask your health care provider or pharmacist if you have questions. COMMON BRAND NAME(S): Seroquel What should I tell my care team before I take this medication? They need to know if you have any of these conditions: Blockage in your bowel Cataracts Constipation Dementia Diabetes Difficulty swallowing Glaucoma Heart disease High levels of prolactin History of breast cancer History of irregular heartbeat Liver disease Low blood counts, like low white cell, platelet, or red cell counts Low blood pressure Parkinson's disease Prostate disease Seizures Suicidal thoughts, plans or attempt; a previous suicide attempt by you or a family member Thyroid disease Trouble passing urine An unusual or allergic reaction to quetiapine, other medications, foods, dyes, or preservatives Pregnant or trying to get pregnant Breast-feeding How should I use this medication? Take this medication by mouth. Swallow it with a drink of water. Follow the directions on the prescription label. If it upsets your stomach you can take it with food. Take your medication at regular intervals. Do not take it more often than directed. Do not stop taking except on the advice of your care team. A special MedGuide will be given to you by the pharmacist with each prescription and refill. Be sure to read this information carefully each time. Talk to your care team about the use of this medication in children. While this medication may be prescribed for children as young as 10 years for selected conditions, precautions do apply. Patients over age 10 years may have a stronger reaction to this medication and need  smaller doses. Overdosage: If you think you  have taken too much of this medicine contact a poison control center or emergency room at once. NOTE: This medicine is only for you. Do not share this medicine with others. What if I miss a dose? If you miss a dose, take it as soon as you can. If it is almost time for your next dose, take only that dose. Do not take double or extra doses. What may interact with this medication? Do not take this medication with any of the following: Cisapride Dronedarone Metoclopramide Pimozide Thioridazine This medication may also interact with the following: Alcohol Antihistamines for allergy, cough, and cold Atropine Avasimibe Certain antivirals for HIV or hepatitis Certain medications for anxiety or sleep Certain medications for bladder problems like oxybutynin, tolterodine Certain medications for depression like amitriptyline, fluoxetine, nefazodone, sertraline Certain medications for fungal infections like fluconazole, ketoconazole, itraconazole, posaconazole Certain medications for stomach problems like dicyclomine, hyoscyamine Certain medications for travel sickness like scopolamine Cimetidine General anesthetics like halothane, isoflurane, methoxyflurane, propofol Ipratropium Levodopa or other medications for Parkinson's disease Medications for blood pressure Medications for seizures Medications that relax muscles for surgery Narcotic medications for pain Other medications that prolong the QT interval (cause an abnormal heart rhythm) Phenothiazines like chlorpromazine, prochlorperazine Rifampin St. John's Wort This list may not describe all possible interactions. Give your health care provider a list of all the medicines, herbs, non-prescription drugs, or dietary supplements you use. Also tell them if you smoke, drink alcohol, or use illegal drugs. Some items may interact with your medicine. What should I watch for while using this medication? Visit your care team for regular checks on  your progress. Tell your care team if symptoms do not start to get better or if they get worse. Do not stop taking except on your care team's advice. You may develop a severe reaction. Your care team will tell you how much medication to take. You may need to have an eye exam before and during use of this medication. This medication may increase blood sugar. Ask your care team if changes in diet or medications are needed if you have diabetes. Patients and their families should watch out for new or worsening depression or thoughts of suicide. Also watch out for sudden or severe changes in feelings such as feeling anxious, agitated, panicky, irritable, hostile, aggressive, impulsive, severely restless, overly excited and hyperactive, or not being able to sleep. If this happens, especially at the beginning of antidepressant treatment or after a change in dose, call your care team. You may get dizzy or drowsy. Do not drive, use machinery, or do anything that needs mental alertness until you know how this medication affects you. Do not stand or sit up quickly, especially if you are an older patient. This reduces the risk of dizzy or fainting spells. Alcohol may interfere with the effect of this medication. Avoid alcoholic drinks. This medication can cause problems with controlling your body temperature. It can lower the response of your body to cold temperatures. If possible, stay indoors during cold weather. If you must go outdoors, wear warm clothes. It can also lower the response of your body to heat. Do not overheat. Do not over-exercise. Stay out of the sun when possible. If you must be in the sun, wear cool clothing. Drink plenty of water. If you have trouble controlling your body temperature, call your care team right away. What side effects may I notice from receiving this medication? Side effects that you  should report to your care team as soon as possible: Allergic reactions--skin rash, itching, hives,  swelling of the face, lips, tongue, or throat Heart rhythm changes--fast or irregular heartbeat, dizziness, feeling faint or lightheaded, chest pain, trouble breathing High blood sugar (hyperglycemia)--increased thirst or amount of urine, unusual weakness or fatigue, blurry vision High fever, stiff muscles, increased sweating, fast or irregular heartbeat, and confusion, which may be signs of neuroleptic malignant syndrome High prolactin level--unexpected breast tissue growth, discharge from the nipple, change in sex drive or performance, irregular menstrual cycle Increase in blood pressure in children Infection--fever, chills, cough, or sore throat Low blood pressure--dizziness, feeling faint or lightheaded, blurry vision Low thyroid levels (hypothyroidism)--unusual weakness or fatigue, increased sensitivity to cold, constipation, hair loss, dry skin, weight gain, feelings of depression Pain or trouble swallowing Seizures Stroke--sudden numbness or weakness of the face, arm, or leg, trouble speaking, confusion, trouble walking, loss of balance or coordination, dizziness, severe headache, change in vision Sudden eye pain or change in vision such as blurry vision, seeing halos around lights, vision loss Thoughts of suicide or self-harm, worsening mood, feelings of depression Trouble passing urine Uncontrolled and repetitive body movements, muscle stiffness or spasms, tremors or shaking, loss of balance or coordination, restlessness, shuffling walk, which may be signs of extrapyramidal symptoms (EPS) Side effects that usually do not require medical attention (report to your care team if they continue or are bothersome): Constipation Dizziness Drowsiness Dry mouth Weight gain This list may not describe all possible side effects. Call your doctor for medical advice about side effects. You may report side effects to FDA at 1-800-FDA-1088. Where should I keep my medication? Keep out of the reach of  children. Store at room temperature between 15 and 30 degrees C (59 and 86 degrees F). Throw away any unused medication after the expiration date. NOTE: This sheet is a summary. It may not cover all possible information. If you have questions about this medicine, talk to your doctor, pharmacist, or health care provider.  2022 [employer]/Gold Standard (2021-04-07 00:00:00)

## 2021-08-10 NOTE — Telephone Encounter (Signed)
Pt was here for appointment with Dr.Ahern this am . Pt has Elevated BP .Per Dr Jaynee Eagles call PCP today to make them aware of Elevated BP. Did tell Patient. Also called life partner Cletus Gash [facility]) to let him know Dr.Ahern's recommendation to contact PCP  today to make him aware of Elevated BP . Cletus Gash verbalized understanding and thanked me for calling .

## 2021-08-10 NOTE — Progress Notes (Signed)
[name]    Provider:  Dr Jaynee Eagles Referring Provider: Lawerance Cruel, MD Primary Care Physician:  Lawerance Cruel, MD  CC:  memory loss, +DAT Scan c/w Parkinson's disease spectrum and progressive dementia of the Parkinson's type with significant motor deficits that are progressing.  08/10/2021. Motor symptoms progressing, Partner here. BP ws elevated today 180s-190s see Dr. Harrington Challenger but asymptomatic. He feels stable. Quite a few hallucinations. He is convinced there are 2 of his partner. They are disturbing. He sees people at the house. His qtc is nml 402, discussed black box warnings seqoquel. He is doing well on the carbidopa levodopa, cont current dose. Some stumbles. No falls. Discussed PT. No depression, still walking and exercising, he stays cold all the time, no weight loss. Compliant on sleep apnea. Discussed fall risk, swallowing(let us know if any choking), can use botox if drooling, skin checks, PT, reviewed exercise and encouraged. Increase donepezil, no side effects to 63m, pulse stable 51, elevated BP see Dr. RHarrington Challenger Will continue to follow clinically/   Interval history January 26, 2021: Patient is here for follow-up on Parkinson's disease.  I last saw him in April 2020 however he seen MWard Givens NP multiple times since then.  He is on clonazepam for REM sleep disorder.  He recently met again for follow-up of neurocognitive testing, progression in his Parkinson's disease, per Dr.Rodenbough he appears to be responding well to medications to manage his tremors, patient's older brother has Parkinson's as well and the motor tremors the patient's brothers are more severe, there is worsening cognitive function with regard to memory as well as clear progression of motor function in the past several years.  As compared to 2020, current comparisons do show further progression in number of significant cognitive and motor function domains, loss and fine motor control, loss in  grip strength bilaterally, loss and global cognitive functioning with generally preserved verbal skills albeit significantly below his predicted levels based on his educational and occupational history.  Also showing significant loss with regard to visual spatial skills, information processing speed and overall speed of mental operations.  Overall decrease in memory and learning capacity.  Does not appear depressed, has good support from his family, it was recommended he stop cooking independently and do not drive.  Here with his partner. Patient says he has reached a plateau. For the most part he is stable. No falls. Walking every day, they go early 1.5 miles a day. His appetite is good. He feels a vitamin gets stuck in his mouth but if broken in half he swallowing, occ will swallow wrong not often. Son says everything is fine. He has had 4 shots. No significant weight loss. He has been having hallucinations, 4 instances he was talking to someone they were not there, he looks away and looks bad and they are gone. He has seen his son. Not disturbing, he did see his grandson who was not there. He is convinced there are 2 of his son at the house. No delusions. No frustration or agitation. He has constipation and takes miralax (discuss with pcp). Stable on the clonazepam for REM sleep disorder.Tremor is stable, physcially he is stable.He feels he has lotsof salive in his mouth but no drooling. No depression.   Interval history 11/14/2018:  He was diagnosed with OSA and started on cpap. He saw Dr. TCarles Colletwho suspects he is in the pre-motor phase of Parkinson's Disease. [facility] Also suggested speech  therapy. Clonazepam for REM sleep disorder. He is feeling well. He stammers and gets frustrated. I recommended speech therapy. It has been a little stressful recently because of cancellations he has to deal with and getting people back money. Will refer for speech therapy.  He has an appointment for formal neurocognitive testing. I recommended his partner go to every appointment He is on clonazepam and feels his REM sleep disorder is better. He is on a cpap. He still feels like he flails and we can increase the medication as needed. He has no side effects to the Sinemet, he will start taking a whole tablet tid q4hrs. Refer to cardiologist. Will reach out to cardiologist about his afib and asa. I encouraged him to take his partner to all the appointments, I don;t think he was able to tell me accurately what Dr. Carles Collet had explained to him. He is having swallowing difficulties, coughing, discussed swallow evaluation.  Interval history 08/11/2018: Here with partner.  Patient presented initially to me with concerns of memory loss, exam showed parkinsonism.  DAT scan was consistent with a Parkinson spectrum disorder.  Discussed in detail Parkinson's syndromes, Lewy Body Dementia vs Parkinson's Disease.  Also discussed pathophysiology, possible etiologies, progression of disorder, modifying factors such as starting medication and exercise.  Also provided community resources and on line sites for information.  We will start Sinemet, his partner reports that he has slight confusion so on to stay away from the dopamine agonists and will start Sinemet at this time, also recommended Parkinson specific exercise classes, however Parkinson's another community resources.  We also discussed that there is a Parkinson's clinic in this area with an excellent specialist Wells Guiles Tat and they would like to see her.  HPI:  Ryan Woodward is a 76 y.o. male here as requested by Dr. Harrington Challenger for memory problems.  Past medical history of atrial fibrillation, insomnia, memory difficulties, bladder cancer.  He is a former smoker. He feels he has memory problems. He can't think of a world, will remember in a few seconds, been ongoing for 5-6 months. Unclear if it is getting worse. A few times a day. Sometimes he  describes the object and he is frustrated and stops. He has a partner for 39 years. His partner doesn't think there is anything wrong. 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 Mother had alzheimers. Mother started having memory issues at the age of 65. Still working and has his own business. He has noticed he goes to the computer to pay bills and he has to stop and think about what to do at the computer. He loses things in the house ongoing for a few months. Driving is fine, not gettig lost or accidents, he usually knows the day and date, he remembers important dates. Possibly after his bladder cancer chemo and surgery in 2013 his partner noticed he started having nightmares, always involves someone chasing him, then he wouldn't remember the dreams, act out the dreams and he talks gibberish in his sleep he has found his bed sheets on the floor, once he got up and was going to leave in his underwear and his partner stopped him. He notices things on the night stand on the floor. No falls or imbalance. No tremors. Voice is softer. No changes in handwriting. He has constipation and loss of smell.   Reviewed notes, labs and imaging from outside physicians, which showed:  Reviewed notes from referring physician Dr. Harrington Challenger.  Patient feels his memory  has been progressively worsening especially in the months prior to last visit which was July 2019.  Memory is gotten worse, dropping words, trouble finishing sentences.  Mother and maternal uncle had Alzheimer's as well as other relatives.  He feels as though he flails in bed, trazodone helps some, has grogginess in the morning.  B12 and thyroid were ordered at last appointment, also CMP was ordered, he takes trazodone at night for insomnia.  TSH 2.9, CMP unremarkable with BUN 13 and creatinine 0.91, vitamin B12 quantity was not sufficient for analysis  Review of Systems: Patient complains of symptoms per HPI as well as the following symptoms: PD, feels cold .  Pertinent negatives and positives per HPI. All others negative     Social History   Socioeconomic History   Marital status: Ryan Woodward    Spouse name: Not on file   Number of children: 0   Years of education: Not on file   Highest education level: Master's degree (e.g., MA, MS, MEng, MEd, MSW, MBA)  Occupational History   Occupation: Band teacher-Retired  Tobacco Use   Smoking status: Former    Types: Cigarettes    Quit date: 09/1985    Years since quitting: 35.9   Smokeless tobacco: Never  Vaping Use   Vaping Use: Never used  Substance and Sexual Activity   Alcohol use: Yes    Alcohol/week: 14.0 standard drinks    Types: 14 Cans of beer per week    Comment: 2 beers daily   Drug use: Never   Sexual activity: Not on file  Other Topics Concern   Not on file  Social History Narrative   Lives at home with partner   Right handed   Caffeine: 2 cups of coffee each morning    Social Determinants of Health   Financial Resource Strain: Not on file  Food Insecurity: Not on file  Transportation Needs: Not on file  Physical Activity: Not on file  Stress: Not on file  Social Connections: Not on file  Intimate Partner Violence: Not on file    Family History  Problem Relation Age of Onset   Alzheimer's disease Mother    Stroke Mother    Alzheimer's disease Maternal Uncle    Parkinson's disease Neg Hx     Past Medical History:  Diagnosis Date   A-fib [facility])    isolated episode during colonoscopy   Allergies    Back pain    Bladder cancer ([place]) dx aug'12   s/p turbt   Enlarged prostate with urinary obstruction    nocturia/ freq/ dysuria   GERD (gastroesophageal reflux disease)    OSA on CPAP    wears CPAP nightly   PD (Parkinson's disease) ([place])    Skin cancer of nose 05/2018   basal cell    Past Surgical History:  Procedure Laterality Date   CERVICAL FUSION  2000   C4-5   COLONOSCOPY     CYSTOSCOPY  06/16/2011   Procedure: CYSTOSCOPY;  Surgeon:  Molli Hazard, MD;  Location: [facility];  Service: Urology;  Laterality: N/A;   CYSTOSCOPY WITH URETHRAL DILATATION  w/ TURBT BX   05-04-11   INGUINAL HERNIA REPAIR Bilateral 02/07/2019   Procedure: BILATERAL INGUINAL HERNIA REPAIR WITH MESH;  Surgeon: Donnie Mesa, MD;  Location: [facility];  Service: General;  Laterality: Bilateral;  GENERAL AND TAP BLOCK ANESTHESIA   INSERTION OF MESH Bilateral 02/07/2019   Procedure: INSERTION OF MESH;  Surgeon: Donnie Mesa,  MD;  Location: [facility];  Service: General;  Laterality: Bilateral;   SINUS SURGERY     TRANSURETHRAL RESECTION OF PROSTATE  06/16/2011   Procedure: TRANSURETHRAL RESECTION OF THE PROSTATE (TURP);  Surgeon: Molli Hazard, MD;  Location: [facility];  Service: Urology;  Laterality: N/A;  URETHRAL DILATATION, MEATOTOMY, PENIL BLOCK    Current Outpatient Medications  Medication Sig Dispense Refill   aspirin EC 81 MG tablet Take 81 mg by mouth daily.     carbidopa-levodopa (SINEMET IR) 25-100 MG tablet TAKE 1 TAB 3 XDAILY TAKE 4-5HRS APART FOR EXAMPLE 8AM 12 & 4PM OR SIMILAR DO NOT EAT PROTEIN 30 MIM 270 tablet 1   clonazePAM (KLONOPIN) 0.5 MG tablet TAKE 1/2 TO 1 TAB BY MOUTH AT BEDTIME FOR SLEEP 30 tablet 4   donepezil (ARICEPT) 10 MG tablet Take 1 tablet (10 mg total) by mouth at bedtime. 90 tablet 4   fluticasone (FLONASE) 50 MCG/ACT nasal spray Place 2 sprays into the nose as needed.     Multiple Vitamins-Minerals (MULTIVITAMIN PO) Take by mouth as needed.     QUEtiapine (SEROQUEL) 25 MG tablet Take 1 tablet (25 mg total) by mouth 2 (two) times daily. 60 tablet 11   sildenafil (REVATIO) 20 MG tablet Take 20 mg by mouth 3 (three) times daily as needed.     No current facility-administered medications for this visit.    Allergies as of 08/10/2021   (No Known Allergies)    Vitals: BP (!) 184/94    Pulse (!) 51    Ht '6\' 4"'  (1.93 m)    Wt 176 lb (79.8  kg)    BMI 21.42 kg/m  Last Weight:  Wt Readings from Last 1 Encounters:  08/10/21 176 lb (79.8 kg)   Last Height:   Ht Readings from Last 1 Encounters:  08/10/21 '6\' 4"'  (1.93 m)   Physical exam: stable Exam: Gen: NAD, conversant, well nourised, thin, well groomed                     CV: RRR, no MRG. No Carotid Bruits. No peripheral edema, warm, nontender Eyes: Conjunctivae clear without exudates or hemorrhage  Neuro: stable Detailed Neurologic Exam  Speech:    Speech is normal; fluent and spontaneous with normal comprehension.  Cognition:  MMSE - Mini Mental State Exam 07/21/2020 05/23/2018  Orientation to time 4 5  Orientation to Place 5 5  Registration 3 3  Attention/ Calculation 2 5  Recall 1 3  Language- name 2 objects 2 2  Language- repeat 1 1  Language- follow 3 step command 2 3  Language- read & follow direction 1 1  Write a sentence 1 1  Copy design 1 1  Total score 23 30       Cranial Nerves: mild hypomimia    The pupils are round, anisocoria right 31m larger but appear to be equally reactive to light. Attempted fundoscopic exam could not visualize.  Visual fields are full to finger confrontation. Mildly impaired upgaze otherwise extraocular movements are intact. Trigeminal sensation is intact and the muscles of mastication are normal. The face is symmetric. The palate elevates in the midline. Hypophonia. Voice is hypophonic. Shoulder shrug is normal. The tongue has normal motion without fasciculations.    Gait:    Decreased arm swing, bradykinetic,, narrow stance and strde  Motor Observation:    No asymmetry, no atrophy, and no involuntary movements noted. Tone:   Cogwheeling slight increase  right  Posture:    Slightly stooped    Strength:    Strength is V/V in the upper and lower limbs.      Sensation: intact to LT     Reflex Exam:  DTR's: absent AJs. 2+ right patella with crossed reflex, trace left patellar, 1+ biceps   Toes: left down, right  equiv     Clonus:    Clonus is absent. Neg hoffman.        Assessment/Plan:  [age] year old with 1st degree FHx of Dementia in mother (Alzheimers?). Parkinson's Disease (hypophonia, bradykinesis, decreased arm swing, hypomimia, hypophonia, REM Sleep disorder, loss of smell and constipation. +DaT scan.  -DaTscan consistent with Parkinson syndrome.  Had a long discussion about what this means. Idiopathic Parkinson's Disease - Would like to start Aricept(same as rivstigmine) which helps with slow down of memory loss - contacted Dr. Angelena Form, increased(pulse has remained stable on 66m) - For constipation: try the "prune juice cocktail". Mix 1/2 cup applesauce, 2 tablespoons of miller's bran, 4-6 oz. prune juice. Store in rMultimedia programmer Take a tablespoonful per day at first, gradually increasing until you find the amount that works best. Most people find this mixture tasty. -Hallucinations: start Seroquel. Start with 266mseroquel at bedtime may help with sleeping as well. Hold off for a few weeks and then start 1/2 pill seroquel during the day and increase to whole pill if needed -Rem sleep disorder: con't on clonazepam, can increase if needed -Slowly progressive physically, continue current dose of Sinemet -For saliva: If it becomes a problem can consider botox or medication .-Diagnosed with OSA here in our office, on cpap now, compliant - formal Neurocognitive testing c/w parkinson'd dsease -Provided information on community resouces again today, encouraged balance exercises, physical fitness - stressed exercise as a way to slow down progression - consider clonazepam for REM sleep disorder as a way to slow down the disease,  - Continue Sinemet. Will stay aware from the DAMunsey Parkecause of reports from partner of more confusion in patient - Gait abnormality - [facility] - REM sleep disorder: continue Clonazepam - He saw cardiology for afib, he wanted to me follow up and see if  they recommend anti-coagulation at this time. Again I suggested he bring his partner to all appointments because I am not sure he is comprehending or retaining everything he is told. And see his cardiologist. - He "stammers" and gets frustrated. I recommended speech therapy. - Again,  encouraged him to take his partner to all the appointments, I don;t think he was able to tell me accurately last time what was discussed in recent appointments. Left a message for partner on his cell regarding this. He came today - Difficulty swallowing: swallow and speech eval last time, follow - regular skin checks - cold all the time, blood work today   Orders Placed This Encounter  Procedures   Comprehensive metabolic panel   CBC with Differential/Platelets   TSH   Ambulatory referral to Physical Therapy    Meds ordered this encounter  Medications   QUEtiapine (SEROQUEL) 25 MG tablet    Sig: Take 1 tablet (25 mg total) by mouth 2 (two) times daily.    Dispense:  60 tablet    Refill:  11   donepezil (ARICEPT) 10 MG tablet    Sig: Take 1 tablet (10 mg total) by mouth at bedtime.    Dispense:  90 tablet    Refill:  4     Cc:  Dr. Harrington Challenger  I spent over 69 minutes of face-to-face and non-face-to-face time with patient on the  1. Parkinson's disease ([place])   2. Gait abnormality   3. Feels cold   4. 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, without behavioral disturbance, psychotic disturbance, mood disturbance, or anxiety (HCC)   5. Hallucinations    diagnosis.  This included previsit chart review, lab review, study review, order entry, electronic health record documentation, patient education on the different diagnostic and therapeutic options, counseling and coordination of care, risks and benefits of management, compliance, or risk factor reduction   Sarina Ill, MD  [facility] [address] [place], [place] [address]  Phone [phone] Fax [phone]

## 2021-08-11 LAB — CBC WITH DIFFERENTIAL/PLATELET
Basophils Absolute: 0 10*3/uL (ref 0.0–0.2)
Basos: 1 %
EOS (ABSOLUTE): 0.1 10*3/uL (ref 0.0–0.4)
Eos: 1 %
Hematocrit: 44.6 % (ref 37.5–51.0)
Hemoglobin: 15.7 g/dL (ref 13.0–17.7)
Immature Grans (Abs): 0.1 10*3/uL (ref 0.0–0.1)
Immature Granulocytes: 1 %
Lymphocytes Absolute: 1.1 10*3/uL (ref 0.7–3.1)
Lymphs: 17 %
MCH: 31.7 pg (ref 26.6–33.0)
MCHC: 35.2 g/dL (ref 31.5–35.7)
MCV: 90 fL (ref 79–97)
Monocytes Absolute: 0.6 10*3/uL (ref 0.1–0.9)
Monocytes: 9 %
Neutrophils Absolute: 4.7 10*3/uL (ref 1.4–7.0)
Neutrophils: 71 %
Platelets: 185 10*3/uL (ref 150–450)
RBC: 4.96 x10E6/uL (ref 4.14–5.80)
RDW: 12.8 % (ref 11.6–15.4)
WBC: 6.5 10*3/uL (ref 3.4–10.8)

## 2021-08-11 LAB — COMPREHENSIVE METABOLIC PANEL
ALT: 5 IU/L (ref 0–44)
AST: 18 IU/L (ref 0–40)
Albumin/Globulin Ratio: 2.9 — ABNORMAL HIGH (ref 1.2–2.2)
Albumin: 4.7 g/dL (ref 3.7–4.7)
Alkaline Phosphatase: 51 IU/L (ref 44–121)
BUN/Creatinine Ratio: 17 (ref 10–24)
BUN: 16 mg/dL (ref 8–27)
Bilirubin Total: 1 mg/dL (ref 0.0–1.2)
CO2: 26 mmol/L (ref 20–29)
Calcium: 9.7 mg/dL (ref 8.6–10.2)
Chloride: 105 mmol/L (ref 96–106)
Creatinine, Ser: 0.94 mg/dL (ref 0.76–1.27)
Globulin, Total: 1.6 g/dL (ref 1.5–4.5)
Glucose: 98 mg/dL (ref 70–99)
Potassium: 4.5 mmol/L (ref 3.5–5.2)
Sodium: 143 mmol/L (ref 134–144)
Total Protein: 6.3 g/dL (ref 6.0–8.5)
eGFR: 85 mL/min/{1.73_m2} (ref >59)

## 2021-08-11 LAB — TSH: TSH: 2.1 u[IU]/mL (ref 0.450–4.500)

## 2021-09-17 ENCOUNTER — Other Ambulatory Visit: Payer: Self-pay

## 2021-09-17 ENCOUNTER — Ambulatory Visit: Payer: Medicare PPO | Attending: Neurology | Admitting: Physical Therapy

## 2021-09-17 DIAGNOSIS — R293 Abnormal posture: Secondary | ICD-10-CM | POA: Insufficient documentation

## 2021-09-17 DIAGNOSIS — R29818 Other symptoms and signs involving the nervous system: Secondary | ICD-10-CM | POA: Insufficient documentation

## 2021-09-17 DIAGNOSIS — R269 Unspecified abnormalities of gait and mobility: Secondary | ICD-10-CM | POA: Insufficient documentation

## 2021-09-17 DIAGNOSIS — G20 Parkinson's disease: Secondary | ICD-10-CM | POA: Diagnosis not present

## 2021-09-17 DIAGNOSIS — R2681 Unsteadiness on feet: Secondary | ICD-10-CM | POA: Insufficient documentation

## 2021-09-17 DIAGNOSIS — R2689 Other abnormalities of gait and mobility: Secondary | ICD-10-CM | POA: Diagnosis not present

## 2021-09-18 NOTE — Addendum Note (Signed)
Addended by: Frazier Butt on: 09/18/2021 09:13 AM   Modules accepted: Orders

## 2021-09-18 NOTE — Therapy (Signed)
[facility] [place], [place], [place], [address] Phone: [phone]   Fax:  [phone]  Physical Therapy Evaluation  Patient Details  Name: Ryan Woodward MRN: [id number] Date of Birth: 15-Aug-1945 Referring Provider (PT): Jaynee Eagles   Encounter Date: 09/17/2021   PT End of Session - 09/18/21 0903     Visit Number 1    Number of Visits 13    Date for PT Re-Evaluation 10/30/21    Authorization Type [employer] Medicare-submitted auth request at eval    Progress Note Due on Visit 10    PT Start Time 1023    PT Stop Time 1101    PT Time Calculation (min) 38 min    Behavior During Therapy [facility] for tasks assessed/performed             Past Medical History:  Diagnosis Date   A-fib ([place])    isolated episode during colonoscopy   Allergies    Back pain    Bladder cancer ([place]) dx aug'12   s/p turbt   Enlarged prostate with urinary obstruction    nocturia/ freq/ dysuria   GERD (gastroesophageal reflux disease)    OSA on CPAP    wears CPAP nightly   PD (Parkinson's disease) ([place])    Skin cancer of nose 05/2018   basal cell    Past Surgical History:  Procedure Laterality Date   CERVICAL FUSION  2000   C4-5   COLONOSCOPY     CYSTOSCOPY  06/16/2011   Procedure: CYSTOSCOPY;  Surgeon: Molli Hazard, MD;  Location: [facility];  Service: Urology;  Laterality: N/A;   CYSTOSCOPY WITH URETHRAL DILATATION  w/ TURBT BX   05-04-11   INGUINAL HERNIA REPAIR Bilateral 02/07/2019   Procedure: BILATERAL INGUINAL HERNIA REPAIR WITH MESH;  Surgeon: Donnie Mesa, MD;  Location: [place];  Service: General;  Laterality: Bilateral;  GENERAL AND TAP BLOCK ANESTHESIA   INSERTION OF MESH Bilateral 02/07/2019   Procedure: INSERTION OF MESH;  Surgeon: Donnie Mesa, MD;  Location: [place];  Service: General;  Laterality: Bilateral;   SINUS SURGERY     TRANSURETHRAL RESECTION OF PROSTATE   06/16/2011   Procedure: TRANSURETHRAL RESECTION OF THE PROSTATE (TURP);  Surgeon: Molli Hazard, MD;  Location: [facility];  Service: Urology;  Laterality: N/A;  URETHRAL DILATATION, MEATOTOMY, PENIL BLOCK    There were no vitals filed for this visit.    Subjective Assessment - 09/17/21 1025     Subjective I feel like my gait has changed a bit-not as tall as I was.  Feel it's changed over the past 6-12 months.    Pertinent History PMH:  A-fib, bladder ca, GERD, OSA, basal cell skin ca, cervical fusion    Patient Stated Goals Pt's goals for therapy are "nothing that I know of."    Currently in Pain? No/denies                [facility] PT Assessment - 09/17/21 1027       Assessment   Medical Diagnosis Parkinson's disease    Referring Provider (PT) Jaynee Eagles    Onset Date/Surgical Date 08/10/21    Hand Dominance Right      Precautions   Precautions Fall    Precaution Comments DatScan shows PD spectrum; hx of progressive dementia; hasn't driven in > 1 year      Balance Screen   Has the patient fallen in the past  6 months Yes    How many times? 1    Has the patient had a decrease in activity level because of a fear of falling?  Yes    Is the patient reluctant to leave their home because of a fear of falling?  No      Home Ecologist residence    Living Arrangements Spouse/significant other   partner   Available Help at Discharge Family    Type of [place] Access Level entry    [place] None      Prior Function   Level of Independence Independent    Vocation Retired    Database administrator    Leisure Enjoys walking in the neighborhood; doesn't have current fitness routine      Cognition   Behaviors Other (comment)   stops mid-sentence, mid-thought several times while answering questions     Observation/Other Assessments   Focus on Therapeutic Outcomes (FOTO)  NA       Posture/Postural Control   Posture/Postural Control Postural limitations    Postural Limitations Rounded Shoulders;Forward head      ROM / Strength   AROM / PROM / Strength AROM;Strength      AROM   Overall AROM  Within functional limits for tasks performed      Strength   Overall Strength Deficits    Strength Assessment Site Hip;Knee;Ankle    Right/Left Hip Right;Left    Right Hip Flexion 5/5    Right Hip ABduction 4/5    Left Hip Flexion 5/5    Left Hip ABduction 4/5    Right/Left Knee Right;Left    Right Knee Flexion 5/5    Right Knee Extension 4+/5    Left Knee Flexion 5/5    Left Knee Extension 4/5    Right/Left Ankle Right;Left    Right Ankle Dorsiflexion 4/5    Left Ankle Dorsiflexion 4/5      Transfers   Transfers Sit to Stand;Stand to Sit    Sit to Stand 6: Modified independent (Device/Increase time);Without upper extremity assist;From chair/3-in-1    Five time sit to stand comments  13.84    Stand to Sit 6: Modified independent (Device/Increase time);Without upper extremity assist;To chair/3-in-1      Ambulation/Gait   Ambulation/Gait Yes    Ambulation/Gait Assistance 6: Modified independent (Device/Increase time)    Ambulation Distance (Feet) 60 Feet   x 4   Assistive device None    Gait Pattern Step-through pattern;Decreased arm swing - right;Decreased arm swing - left;Narrow base of support;Poor foot clearance - left;Poor foot clearance - right    Ambulation Surface Level;Indoor    Gait velocity 10.82 sec = 3.03 ft/sec      Standardized Balance Assessment   Standardized Balance Assessment Timed Up and Go Test;Mini-BESTest      Mini-BESTest   Sit To Stand Normal: Comes to stand without use of hands and stabilizes independently.    Rise to Toes Normal: Stable for 3 s with maximum height.    Stand on one leg (left) Moderate: < 20 s   2.03, 5.72   Stand on one leg (right) Moderate: < 20 s   9.28 sec, 12.32 sec   Stand on one leg - lowest score 1     Compensatory Stepping Correction - Forward No step, OR would fall if not caught, OR falls spontaneously.    Compensatory Stepping Correction -  Backward No step, OR would fall if not caught, OR falls spontaneously.    Compensatory Stepping Correction - Left Lateral Severe: Falls, or cannot step    Compensatory Stepping Correction - Right Lateral Severe:  Falls, or cannot step    Stepping Corredtion Lateral - lowest score 0    Stance - Feet together, eyes open, firm surface  Normal: 30s    Stance - Feet together, eyes closed, foam surface  Moderate: < 30s   18   Incline - Eyes Closed Normal: Stands independently 30s and aligns with gravity    Change in Gait Speed Normal: Significantly changes walkling speed without imbalance    Walk with head turns - Horizontal Moderate: performs head turns with reduction in gait speed.    Walk with pivot turns Normal: Turns with feet close FAST (< 3 steps) with good balance.    Step over obstacles Normal: Able to step over box with minimal change of gait speed and with good balance.    Timed UP & GO with Dual Task Moderate: Dual Task affects either counting OR walking (>10%) when compared to the TUG without Dual Task.    Mini-BEST total score 18      Timed Up and Go Test   Normal TUG (seconds) 13.87    Manual TUG (seconds) 13.47    Cognitive TUG (seconds) 17.43    TUG Comments Scores > 13.5-15 seconds indicates increased fall risk; >10% difference indicates difficulty with dual tasking                        Objective measurements completed on examination: See above findings.                PT Education - 09/18/21 0902     Education Details Eval results, POC    Person(s) Educated Patient    Methods Explanation    Comprehension Verbalized understanding              PT Short Term Goals - 09/18/21 0907       PT SHORT TERM GOAL #1   Title Pt will perform HEP with family supervision for improved strength, balance,  transfers, and gait.  TARGET 10/16/2021    Time 4    Period Weeks    Status New      PT SHORT TERM GOAL #2   Title Pt will improve 5x sit<>stand to less than or equal to 12.5 sec to demonstrate improved functional strength and transfer efficiency.    Baseline 13.84    Time 4    Period Weeks    Status New               PT Long Term Goals - 09/18/21 0908       PT LONG TERM GOAL #1   Title Pt will perform progression of HEP with family supervision for improved strength, balance, transfers, and gait.  TARGET 10/30/2021    Time 6    Period Weeks    Status New      PT LONG TERM GOAL #2   Title Pt will improve MiniBESTest score to at least 22/28 to decrease fall risk.    Baseline 18/28    Time 6    Period Weeks    Status New      PT LONG TERM GOAL #3   Title Pt will improve TUG cog score to less than or equal to 15 sec for decreased fall risk.  Baseline 17.43 sec    Time 6    Period Weeks    Status New      PT LONG TERM GOAL #4   Title Pt will verbalize understanding of local Parkinsons disease community resources.    Time 6    Period Weeks    Status New                    Plan - 09/18/21 0904     Clinical Impression Statement Pt is a 76 year old male with dx of Parkinson's disease, with Datscan showing possible PD spectrum.  Pt reports changes in his gait over the past year.  He presents to OPPT with abnormal posture, decreased balance, decreased timing and coordination of gait, postural instability.  He is at fall risk per MiniBESTest and TUG cog scores.  He would benefit from skilled PT to address the above stated deficits to decrease fall risk and improve overall functional mobility.    Personal Factors and Comorbidities Comorbidity 3+    Comorbidities PMH:  A-fib, bladder ca, GERD, OSA, basal cell skin ca, cervical fusion    Examination-Activity Limitations Locomotion Level;Transfers    Examination-Participation Restrictions Community Activity     Stability/Clinical Decision Making Evolving/Moderate complexity    Clinical Decision Making Moderate    Rehab Potential Good    PT Frequency 2x / week    PT Duration 6 weeks   plus eval   PT Treatment/Interventions ADLs/Self Care Home Management;Gait training;Functional mobility training;Therapeutic activities;Therapeutic exercise;Balance training;Neuromuscular re-education;DME Instruction;Patient/family education    PT Next Visit Plan Initiate HEP-sit<>stand transfers, PWR! 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 [place], [place], [address] Phone: [phone]   Fax:  [phone]  Name: Ryan Woodward MRN: [id number] Date of Birth: Jun 15, 1946

## 2021-09-22 ENCOUNTER — Other Ambulatory Visit: Payer: Self-pay

## 2021-09-22 ENCOUNTER — Ambulatory Visit: Payer: Medicare PPO

## 2021-09-22 DIAGNOSIS — R2681 Unsteadiness on feet: Secondary | ICD-10-CM

## 2021-09-22 DIAGNOSIS — R269 Unspecified abnormalities of gait and mobility: Secondary | ICD-10-CM | POA: Diagnosis not present

## 2021-09-22 DIAGNOSIS — R2689 Other abnormalities of gait and mobility: Secondary | ICD-10-CM

## 2021-09-22 DIAGNOSIS — R293 Abnormal posture: Secondary | ICD-10-CM

## 2021-09-22 DIAGNOSIS — G20 Parkinson's disease: Secondary | ICD-10-CM | POA: Diagnosis not present

## 2021-09-22 DIAGNOSIS — R29818 Other symptoms and signs involving the nervous system: Secondary | ICD-10-CM

## 2021-09-22 NOTE — Therapy (Signed)
[facility] [place], [place], [place], [address] Phone: [phone]   Fax:  [phone]  Physical Therapy Treatment  Patient Details  Name: Ryan Woodward MRN: [id number] Date of Birth: 27-Mar-1946 Referring Provider (PT): Jaynee Eagles   Encounter Date: 09/22/2021   PT End of Session - 09/22/21 1017     Visit Number 2    Number of Visits 13    Date for PT Re-Evaluation 10/30/21    Authorization Type [employer] Medicare-submitted auth request at eval    Progress Note Due on Visit 10    PT Start Time 1017    PT Stop Time 1100    PT Time Calculation (min) 43 min    Behavior During Therapy [facility] for tasks assessed/performed             Past Medical History:  Diagnosis Date   A-fib ([place])    isolated episode during colonoscopy   Allergies    Back pain    Bladder cancer ([place]) dx aug'12   s/p turbt   Enlarged prostate with urinary obstruction    nocturia/ freq/ dysuria   GERD (gastroesophageal reflux disease)    OSA on CPAP    wears CPAP nightly   PD (Parkinson's disease) ([place])    Skin cancer of nose 05/2018   basal cell    Past Surgical History:  Procedure Laterality Date   CERVICAL FUSION  2000   C4-5   COLONOSCOPY     CYSTOSCOPY  06/16/2011   Procedure: CYSTOSCOPY;  Surgeon: Molli Hazard, MD;  Location: [facility];  Service: Urology;  Laterality: N/A;   CYSTOSCOPY WITH URETHRAL DILATATION  w/ TURBT BX   05-04-11   INGUINAL HERNIA REPAIR Bilateral 02/07/2019   Procedure: BILATERAL INGUINAL HERNIA REPAIR WITH MESH;  Surgeon: Donnie Mesa, MD;  Location: [place];  Service: General;  Laterality: Bilateral;  GENERAL AND TAP BLOCK ANESTHESIA   INSERTION OF MESH Bilateral 02/07/2019   Procedure: INSERTION OF MESH;  Surgeon: Donnie Mesa, MD;  Location: [place];  Service: General;  Laterality: Bilateral;   SINUS SURGERY     TRANSURETHRAL RESECTION OF PROSTATE  06/16/2011    Procedure: TRANSURETHRAL RESECTION OF THE PROSTATE (TURP);  Surgeon: Molli Hazard, MD;  Location: [facility];  Service: Urology;  Laterality: N/A;  URETHRAL DILATATION, MEATOTOMY, PENIL BLOCK    There were no vitals filed for this visit.   Subjective Assessment - 09/22/21 1020     Subjective No new issues to note since evaluation.    Pertinent History PMH:  A-fib, bladder ca, GERD, OSA, basal cell skin ca, cervical fusion    Patient Stated Goals Pt's goals for therapy are "nothing that I know of."    Currently in Pain? No/denies                [facility] PT Assessment - 09/22/21 0001       Assessment   Medical Diagnosis Parkinson's disease                           [facility] Adult PT Treatment/Exercise - 09/22/21 0001       Transfers   Sit to Stand 7: Independent    Stand to Sit Without upper extremity assist    Number of Reps 10 reps    Transfer Cueing PWR! method    Comments education on rationale of activities specific  to PD, e.g large amplitude, over exaggerated. Benefits of rapid alternating exercise      Neuro Re-ed    Neuro Re-ed Details  Sidestepping performing large amplitude arm movements in tandem "star/pencil" for large amplitude and coordination. Retrowalking 2x2 min with cues for increased step length/hip extension to facilitate stepping strategy. Rapid alternating stair taps 4" box 2x60 sec for coordination and foot clearance in gait      Exercises   Exercises Knee/Hip      Knee/Hip Exercises: Aerobic   Nustep level 6 x 5 min 70-80 SPM                       PT Short Term Goals - 09/18/21 0907       PT SHORT TERM GOAL #1   Title Pt will perform HEP with family supervision for improved strength, balance, transfers, and gait.  TARGET 10/16/2021    Time 4    Period Weeks    Status New      PT SHORT TERM GOAL #2   Title Pt will improve 5x sit<>stand to less than or equal to 12.5 sec to demonstrate  improved functional strength and transfer efficiency.    Baseline 13.84    Time 4    Period Weeks    Status New               PT Long Term Goals - 09/18/21 0908       PT LONG TERM GOAL #1   Title Pt will perform progression of HEP with family supervision for improved strength, balance, transfers, and gait.  TARGET 10/30/2021    Time 6    Period Weeks    Status New      PT LONG TERM GOAL #2   Title Pt will improve MiniBESTest score to at least 22/28 to decrease fall risk.    Baseline 18/28    Time 6    Period Weeks    Status New      PT LONG TERM GOAL #3   Title Pt will improve TUG cog score to less than or equal to 15 sec for decreased fall risk.    Baseline 17.43 sec    Time 6    Period Weeks    Status New      PT LONG TERM GOAL #4   Title Pt will verbalize understanding of local Parkinsons disease community resources.    Time 6    Period Weeks    Status New                   Plan - 09/22/21 1107     Clinical Impression Statement Good activity tolerance demonstrated throughout session with tx emphasis on large amplitude movements and coordination techniques to enhance motor control and facilitate larger, patterned movements and facilitation of righting reactions to reduce risk for falls. Pt amenable to education regarding tx rationale for PD-specific activities. Demonstrates some coordination deficits requiring frequent verbal/visual guidance for lateral maneuvering with large amplitude movements. Difficulty with rapid alternating steps with decomposition of movement appreciated. Continued sessions to improve balance and motor control to reduce risk for falls and sequela of PD    Personal Factors and Comorbidities Comorbidity 3+    Comorbidities PMH:  A-fib, bladder ca, GERD, OSA, basal cell skin ca, cervical fusion    Examination-Activity Limitations Locomotion Level;Transfers    Examination-Participation Restrictions Community Activity     Stability/Clinical Decision Making Evolving/Moderate complexity  Rehab Potential Good    PT Frequency 2x / week    PT Duration 6 weeks   plus eval   PT Treatment/Interventions ADLs/Self Care Home Management;Gait training;Functional mobility training;Therapeutic activities;Therapeutic exercise;Balance training;Neuromuscular re-education;DME Instruction;Patient/family education    PT Next Visit Plan Initiate HEP-sit<>stand transfers, PWR! Moves seated/standing; gait training focus on arm swing, foot clearance, rapid movements    PT Home Exercise Plan [id number] Sit to stand PWR! 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 [place], [place], [address] Phone: [phone]   Fax:  [phone]  Name: Ryan Woodward MRN: [id number] Date of Birth: 01-12-1946

## 2021-09-25 ENCOUNTER — Encounter: Payer: Self-pay | Admitting: Physical Therapy

## 2021-09-25 ENCOUNTER — Other Ambulatory Visit: Payer: Self-pay

## 2021-09-25 ENCOUNTER — Ambulatory Visit: Payer: Medicare PPO | Admitting: Physical Therapy

## 2021-09-25 DIAGNOSIS — R269 Unspecified abnormalities of gait and mobility: Secondary | ICD-10-CM | POA: Diagnosis not present

## 2021-09-25 DIAGNOSIS — R2689 Other abnormalities of gait and mobility: Secondary | ICD-10-CM

## 2021-09-25 DIAGNOSIS — R2681 Unsteadiness on feet: Secondary | ICD-10-CM | POA: Diagnosis not present

## 2021-09-25 DIAGNOSIS — R293 Abnormal posture: Secondary | ICD-10-CM

## 2021-09-25 DIAGNOSIS — G20 Parkinson's disease: Secondary | ICD-10-CM | POA: Diagnosis not present

## 2021-09-25 DIAGNOSIS — R29818 Other symptoms and signs involving the nervous system: Secondary | ICD-10-CM | POA: Diagnosis not present

## 2021-09-25 NOTE — Therapy (Signed)
[facility] [place], [place], [place], [address] Phone: [phone]   Fax:  [phone]  Physical Therapy Treatment  Patient Details  Name: Ryan Woodward MRN: [id number] Date of Birth: 08/11/1945 Referring Provider (PT): Jaynee Eagles   Encounter Date: 09/25/2021   PT End of Session - 09/25/21 1010     Visit Number 3    Number of Visits 13    Date for PT Re-Evaluation 10/30/21    Authorization Type [employer] Medicare-submitted auth request at eval    Progress Note Due on Visit 10    PT Start Time 1016    PT Stop Time 1056    PT Time Calculation (min) 40 min    Activity Tolerance Patient tolerated treatment well    Behavior During Therapy [facility] for tasks assessed/performed             Past Medical History:  Diagnosis Date   A-fib ([place])    isolated episode during colonoscopy   Allergies    Back pain    Bladder cancer ([place]) dx aug'12   s/p turbt   Enlarged prostate with urinary obstruction    nocturia/ freq/ dysuria   GERD (gastroesophageal reflux disease)    OSA on CPAP    wears CPAP nightly   PD (Parkinson's disease) ([place])    Skin cancer of nose 05/2018   basal cell    Past Surgical History:  Procedure Laterality Date   CERVICAL FUSION  2000   C4-5   COLONOSCOPY     CYSTOSCOPY  06/16/2011   Procedure: CYSTOSCOPY;  Surgeon: Molli Hazard, MD;  Location: [facility];  Service: Urology;  Laterality: N/A;   CYSTOSCOPY WITH URETHRAL DILATATION  w/ TURBT BX   05-04-11   INGUINAL HERNIA REPAIR Bilateral 02/07/2019   Procedure: BILATERAL INGUINAL HERNIA REPAIR WITH MESH;  Surgeon: Donnie Mesa, MD;  Location: [place];  Service: General;  Laterality: Bilateral;  GENERAL AND TAP BLOCK ANESTHESIA   INSERTION OF MESH Bilateral 02/07/2019   Procedure: INSERTION OF MESH;  Surgeon: Donnie Mesa, MD;  Location: [place];  Service: General;  Laterality: Bilateral;   SINUS  SURGERY     TRANSURETHRAL RESECTION OF PROSTATE  06/16/2011   Procedure: TRANSURETHRAL RESECTION OF THE PROSTATE (TURP);  Surgeon: Molli Hazard, MD;  Location: [facility];  Service: Urology;  Laterality: N/A;  URETHRAL DILATATION, MEATOTOMY, PENIL BLOCK    There were no vitals filed for this visit.   Subjective Assessment - 09/25/21 1009     Pertinent History PMH:  A-fib, bladder ca, GERD, OSA, basal cell skin ca, cervical fusion    Patient Stated Goals Pt's goals for therapy are "nothing that I know of."    Currently in Pain? No/denies                               [facility] Adult PT Treatment/Exercise - 09/25/21 0001       Ambulation/Gait   Ambulation/Gait Yes    Ambulation/Gait Assistance 6: Modified independent (Device/Increase time)    Ambulation Distance (Feet) 60 Feet   x 6; then 340   Assistive device None    Gait Pattern Step-through pattern;Decreased arm swing - right;Decreased arm swing - left;Narrow base of support;Poor foot clearance - left;Poor foot clearance - right    Gait Comments Cues to listen for scuffing of feet and for increased  foot clearance, to "not hear scuffing"      Neuro Re-ed    Neuro Re-ed Details  Alt step taps to 8" step, 20 reps, then 2nd set of 20 reps with reciprocal arm lift.  Forward/back walking 25 ft x 5 reps, cues for forward posture to look ahead in forard direction      Exercises   Exercises Knee/Hip;Other Exercises      Knee/Hip Exercises: Aerobic   Nustep 4 extremities, Level 5, x 6 minutes, cues to keep SPM >80 for increased intensity of movement patterns             Exercises-Reviewed HEP given last visit, and with initial cues, pt able to return demonstrate understanding. Sit to Stand Without Arm Support - 1 x daily - 7 x weekly - 3 sets - 10 reps Jumping Jacks - 1 x daily - 7 x weekly - 3 sets - 10 reps  Pt performs PWR! Moves in standing position x 10 reps, with initial 3-5 reps for  initial movement pattern and technique   PWR! Up for improved posture  PWR! Rock for improved weighshifting  PWR! Twist for improved trunk rotation   PWR! Step for improved step initiation   Visual, verbal, tactile cues provided for technique and intensity         PT Education - 09/25/21 1058     Education Details Continue current HEP DAILY; large movement patterns for walking to NOT hear scuffing with feet    Person(s) Educated Patient    Methods Explanation;Demonstration    Comprehension Verbalized understanding;Returned demonstration;Verbal cues required              PT Short Term Goals - 09/18/21 0907       PT SHORT TERM GOAL #1   Title Pt will perform HEP with family supervision for improved strength, balance, transfers, and gait.  TARGET 10/16/2021    Time 4    Period Weeks    Status New      PT SHORT TERM GOAL #2   Title Pt will improve 5x sit<>stand to less than or equal to 12.5 sec to demonstrate improved functional strength and transfer efficiency.    Baseline 13.84    Time 4    Period Weeks    Status New               PT Long Term Goals - 09/18/21 0908       PT LONG TERM GOAL #1   Title Pt will perform progression of HEP with family supervision for improved strength, balance, transfers, and gait.  TARGET 10/30/2021    Time 6    Period Weeks    Status New      PT LONG TERM GOAL #2   Title Pt will improve MiniBESTest score to at least 22/28 to decrease fall risk.    Baseline 18/28    Time 6    Period Weeks    Status New      PT LONG TERM GOAL #3   Title Pt will improve TUG cog score to less than or equal to 15 sec for decreased fall risk.    Baseline 17.43 sec    Time 6    Period Weeks    Status New      PT LONG TERM GOAL #4   Title Pt will verbalize understanding of local Parkinsons disease community resources.    Time 6    Period Weeks    Status New  Plan - 09/25/21 1010     Clinical Impression  Statement Skilled PT session focused on aerobic activity and large amplitude movement patterns with standing and dynamic exercises today.  Began standing PWR! Moves today in session, and pt will need additional cues and practice prior to sending home as part of HEP.  With exercises today that incorporate upper and lower body movements, pt has difficulty coordinating movements and needs tactile and verbal cues to coordinate; once he gets going, he does well keeping the pattern.  He also needs cues to look ahead versus looking at the ground with gait activities to assist with posture.  He will continue to benefit from skilled PT towards improved posture, functional mobility and balance.    Personal Factors and Comorbidities Comorbidity 3+    Comorbidities PMH:  A-fib, bladder ca, GERD, OSA, basal cell skin ca, cervical fusion    Examination-Activity Limitations Locomotion Level;Transfers    Examination-Participation Restrictions Community Activity    Stability/Clinical Decision Making Evolving/Moderate complexity    Rehab Potential Good    PT Frequency 2x / week    PT Duration 6 weeks   plus eval   PT Treatment/Interventions ADLs/Self Care Home Management;Gait training;Functional mobility training;Therapeutic activities;Therapeutic exercise;Balance training;Neuromuscular re-education;DME Instruction;Patient/family education    PT Next Visit Plan Try again PWR! Moves standing and PWR! up in sitting (add to HEP as apporpriate); gait training focus on arm swing, foot clearance, rapid/large movement patterns    PT Home Exercise Plan [id number] Sit to stand PWR! up, sidestepping with big movement (lateral jumping jack)    Consulted and Agree with Plan of Care Patient             Patient will benefit from skilled therapeutic intervention in order to improve the following deficits and impairments:  Abnormal gait, Difficulty walking, Decreased balance, Decreased mobility, Decreased strength, Postural  dysfunction  Visit Diagnosis: Unsteadiness on feet  Abnormal posture  Other abnormalities of gait and mobility     Problem List Patient Active Problem List   Diagnosis Date Noted   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 ([place]) 08/10/2021   Gait abnormality 08/10/2021   Feels cold 08/10/2021   Hallucinations 08/10/2021   Preop cardiovascular exam 01/17/2019   Paroxysmal atrial fibrillation ([place]) 09/20/2018   Obstructive sleep apnea 09/20/2018   Palpitations 09/20/2018   Parkinson's disease ([place]) 08/11/2018   Memory loss 05/24/2018   Benign prostate hyperplasia 06/16/2011    Ricard Faulkner W., PT 09/25/2021, 11:05 AM  [facility] [address], [place], [place], [address] Phone: [phone]   Fax:  [phone]  Name: Ryan Woodward MRN: [id number] Date of Birth: 1945-10-26

## 2021-09-29 ENCOUNTER — Other Ambulatory Visit: Payer: Self-pay

## 2021-09-29 ENCOUNTER — Ambulatory Visit: Payer: Medicare PPO

## 2021-09-29 DIAGNOSIS — G20 Parkinson's disease: Secondary | ICD-10-CM | POA: Diagnosis not present

## 2021-09-29 DIAGNOSIS — R29818 Other symptoms and signs involving the nervous system: Secondary | ICD-10-CM

## 2021-09-29 DIAGNOSIS — R293 Abnormal posture: Secondary | ICD-10-CM | POA: Diagnosis not present

## 2021-09-29 DIAGNOSIS — R2681 Unsteadiness on feet: Secondary | ICD-10-CM | POA: Diagnosis not present

## 2021-09-29 DIAGNOSIS — R2689 Other abnormalities of gait and mobility: Secondary | ICD-10-CM

## 2021-09-29 DIAGNOSIS — R269 Unspecified abnormalities of gait and mobility: Secondary | ICD-10-CM | POA: Diagnosis not present

## 2021-09-29 NOTE — Patient Instructions (Signed)
Access Code: [email or web address] URL: [email or web address] Date: 09/29/2021 Prepared by: Sherlyn Lees  Exercises Side to Side Weight Shift with Overhead Reach and Counter Support - 1-2 x daily - 7 x weekly - 2 sets - 10 reps Staggered Stance Weight Shift with Arms Reaching - 1-2 x daily - 7 x weekly - 2 sets - 10 reps Sit to Stand Without Arm Support - 1 x daily - 7 x weekly - 2 sets - 10 reps Standing Reach to Opposite Side with Weight Shift - 1 x daily - 7 x weekly - 2 sets - 10 reps

## 2021-09-29 NOTE — Therapy (Signed)
[facility] [place], [place], [place], [address] Phone: [phone]   Fax:  [phone]  Physical Therapy Treatment  Patient Details  Name: Ryan Woodward MRN: [id number] Date of Birth: 06/20/1946 Referring Provider (PT): Jaynee Eagles   Encounter Date: 09/29/2021   PT End of Session - 09/29/21 1107     Visit Number 4    Number of Visits 13    Date for PT Re-Evaluation 10/30/21    Authorization Type [employer] Medicare-submitted auth request at eval    Progress Note Due on Visit 10    PT Start Time 1100    PT Stop Time 1145    PT Time Calculation (min) 45 min    Activity Tolerance Patient tolerated treatment well    Behavior During Therapy [facility] for tasks assessed/performed             Past Medical History:  Diagnosis Date   A-fib ([place])    isolated episode during colonoscopy   Allergies    Back pain    Bladder cancer ([place]) dx aug'12   s/p turbt   Enlarged prostate with urinary obstruction    nocturia/ freq/ dysuria   GERD (gastroesophageal reflux disease)    OSA on CPAP    wears CPAP nightly   PD (Parkinson's disease) ([place])    Skin cancer of nose 05/2018   basal cell    Past Surgical History:  Procedure Laterality Date   CERVICAL FUSION  2000   C4-5   COLONOSCOPY     CYSTOSCOPY  06/16/2011   Procedure: CYSTOSCOPY;  Surgeon: Molli Hazard, MD;  Location: [facility];  Service: Urology;  Laterality: N/A;   CYSTOSCOPY WITH URETHRAL DILATATION  w/ TURBT BX   05-04-11   INGUINAL HERNIA REPAIR Bilateral 02/07/2019   Procedure: BILATERAL INGUINAL HERNIA REPAIR WITH MESH;  Surgeon: Donnie Mesa, MD;  Location: [place];  Service: General;  Laterality: Bilateral;  GENERAL AND TAP BLOCK ANESTHESIA   INSERTION OF MESH Bilateral 02/07/2019   Procedure: INSERTION OF MESH;  Surgeon: Donnie Mesa, MD;  Location: [place];  Service: General;  Laterality: Bilateral;   SINUS  SURGERY     TRANSURETHRAL RESECTION OF PROSTATE  06/16/2011   Procedure: TRANSURETHRAL RESECTION OF THE PROSTATE (TURP);  Surgeon: Molli Hazard, MD;  Location: [facility];  Service: Urology;  Laterality: N/A;  URETHRAL DILATATION, MEATOTOMY, PENIL BLOCK    There were no vitals filed for this visit.   Subjective Assessment - 09/29/21 1143     Subjective Pt reports feeling well, no new issues. Notes difficulty with coordination    Pertinent History PMH:  A-fib, bladder ca, GERD, OSA, basal cell skin ca, cervical fusion    Patient Stated Goals Pt's goals for therapy are "nothing that I know of."                [facility] PT Assessment - 09/29/21 0001       Assessment   Medical Diagnosis Parkinson's disease                           [facility] Adult PT Treatment/Exercise - 09/29/21 0001       Ambulation/Gait   Ambulation/Gait Yes    Ambulation/Gait Assistance 7: Independent    Ambulation Distance (Feet) 640 Feet    Assistive device None    Gait Pattern Step-through pattern;Decreased arm swing - right;Decreased  arm swing - left;Narrow base of support;Poor foot clearance - left;Poor foot clearance - right    Gait Comments performing shuttle walk over 40 ft distance to improve walking speed and rapid turns retrieving total of 8 cones and walking briskly with cues for clearance      Neuro Re-ed    Neuro Re-ed Details  side-side weight shift with overhead reach 2x10. Staggered stance with overhead reach 2x10. Standing reach to opposite side w/ weight shift 2x10      Knee/Hip Exercises: Seated   Sit to Sand 2 sets;10 reps   PWR up posture                      PT Short Term Goals - 09/18/21 0907       PT SHORT TERM GOAL #1   Title Pt will perform HEP with family supervision for improved strength, balance, transfers, and gait.  TARGET 10/16/2021    Time 4    Period Weeks    Status New      PT SHORT TERM GOAL #2   Title Pt will  improve 5x sit<>stand to less than or equal to 12.5 sec to demonstrate improved functional strength and transfer efficiency.    Baseline 13.84    Time 4    Period Weeks    Status New               PT Long Term Goals - 09/18/21 0908       PT LONG TERM GOAL #1   Title Pt will perform progression of HEP with family supervision for improved strength, balance, transfers, and gait.  TARGET 10/30/2021    Time 6    Period Weeks    Status New      PT LONG TERM GOAL #2   Title Pt will improve MiniBESTest score to at least 22/28 to decrease fall risk.    Baseline 18/28    Time 6    Period Weeks    Status New      PT LONG TERM GOAL #3   Title Pt will improve TUG cog score to less than or equal to 15 sec for decreased fall risk.    Baseline 17.43 sec    Time 6    Period Weeks    Status New      PT LONG TERM GOAL #4   Title Pt will verbalize understanding of local Parkinsons disease community resources.    Time 6    Period Weeks    Status New                   Plan - 09/29/21 1144     Clinical Impression Statement Continued with large amplitude movements with emphasis on coordinating opposite extremities, crossing mid line, reaching outside BOS. Difficulty when performing alternating movements requiring visual/verbal cues 75% of the time. Improved foot clearance appreciated during shuttle walk activity with only 3 shoe scuffs during course of 640 ft distance. Continued sessions indicated to progress coordination, balance, and gait speed to reduce risk for falls and improve ambulation    Personal Factors and Comorbidities Comorbidity 3+    Comorbidities PMH:  A-fib, bladder ca, GERD, OSA, basal cell skin ca, cervical fusion    Examination-Activity Limitations Locomotion Level;Transfers    Examination-Participation Restrictions Community Activity    Stability/Clinical Decision Making Evolving/Moderate complexity    Rehab Potential Good    PT Frequency 2x / week    PT  Duration  6 weeks   plus eval   PT Treatment/Interventions ADLs/Self Care Home Management;Gait training;Functional mobility training;Therapeutic activities;Therapeutic exercise;Balance training;Neuromuscular re-education;DME Instruction;Patient/family education    PT Next Visit Plan Try again PWR! Moves standing and PWR! up in sitting (add to HEP as apporpriate); gait training focus on arm swing, foot clearance, rapid/large movement patterns    PT Home Exercise Plan [id number] Sit to stand PWR! up, sidestepping with big movement (lateral jumping jack), lateral weight shift with reach, stride stance with reach opposite,    Consulted and Agree with Plan of Care Patient             Patient will benefit from skilled therapeutic intervention in order to improve the following deficits and impairments:  Abnormal gait, Difficulty walking, Decreased balance, Decreased mobility, Decreased strength, Postural dysfunction  Visit Diagnosis: Unsteadiness on feet  Abnormal posture  Other abnormalities of gait and mobility  Other symptoms and signs involving the nervous system     Problem List Patient Active Problem List   Diagnosis Date Noted   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 ([place]) 08/10/2021   Gait abnormality 08/10/2021   Feels cold 08/10/2021   Hallucinations 08/10/2021   Preop cardiovascular exam 01/17/2019   Paroxysmal atrial fibrillation ([place]) 09/20/2018   Obstructive sleep apnea 09/20/2018   Palpitations 09/20/2018   Parkinson's disease ([place]) 08/11/2018   Memory loss 05/24/2018   Benign prostate hyperplasia 06/16/2011    Toniann Fail, PT 09/29/2021, 11:48 AM  [facility] [address], [place], [place], [address] Phone: [phone]   Fax:  [phone]  Name: CAMAURI CRATON MRN: [id number] Date of Birth: 07/29/1946

## 2021-10-02 ENCOUNTER — Ambulatory Visit: Payer: Medicare PPO | Attending: Neurology

## 2021-10-02 ENCOUNTER — Other Ambulatory Visit: Payer: Self-pay

## 2021-10-02 DIAGNOSIS — R2681 Unsteadiness on feet: Secondary | ICD-10-CM | POA: Diagnosis not present

## 2021-10-02 DIAGNOSIS — R2689 Other abnormalities of gait and mobility: Secondary | ICD-10-CM | POA: Diagnosis not present

## 2021-10-02 DIAGNOSIS — R29818 Other symptoms and signs involving the nervous system: Secondary | ICD-10-CM | POA: Diagnosis not present

## 2021-10-02 DIAGNOSIS — R293 Abnormal posture: Secondary | ICD-10-CM | POA: Diagnosis not present

## 2021-10-02 NOTE — Therapy (Signed)
[facility] [place], [place], [place], [address] Phone: [phone]   Fax:  [phone]  Physical Therapy Treatment  Patient Details  Name: Ryan Woodward MRN: [id number] Date of Birth: 11-28-1945 Referring Provider (PT): Jaynee Eagles   Encounter Date: 10/02/2021   PT End of Session - 10/02/21 1017     Visit Number 5    Number of Visits 13    Date for PT Re-Evaluation 10/30/21    Authorization Type [employer] Medicare-submitted auth request at eval    Progress Note Due on Visit 10    PT Start Time 1015    PT Stop Time 1100    PT Time Calculation (min) 45 min    Activity Tolerance Patient tolerated treatment well    Behavior During Therapy [facility] for tasks assessed/performed             Past Medical History:  Diagnosis Date   A-fib ([place])    isolated episode during colonoscopy   Allergies    Back pain    Bladder cancer ([place]) dx aug'12   s/p turbt   Enlarged prostate with urinary obstruction    nocturia/ freq/ dysuria   GERD (gastroesophageal reflux disease)    OSA on CPAP    wears CPAP nightly   PD (Parkinson's disease) ([place])    Skin cancer of nose 05/2018   basal cell    Past Surgical History:  Procedure Laterality Date   CERVICAL FUSION  2000   C4-5   COLONOSCOPY     CYSTOSCOPY  06/16/2011   Procedure: CYSTOSCOPY;  Surgeon: Molli Hazard, MD;  Location: [facility];  Service: Urology;  Laterality: N/A;   CYSTOSCOPY WITH URETHRAL DILATATION  w/ TURBT BX   05-04-11   INGUINAL HERNIA REPAIR Bilateral 02/07/2019   Procedure: BILATERAL INGUINAL HERNIA REPAIR WITH MESH;  Surgeon: Donnie Mesa, MD;  Location: [place];  Service: General;  Laterality: Bilateral;  GENERAL AND TAP BLOCK ANESTHESIA   INSERTION OF MESH Bilateral 02/07/2019   Procedure: INSERTION OF MESH;  Surgeon: Donnie Mesa, MD;  Location: [place];  Service: General;  Laterality: Bilateral;   SINUS  SURGERY     TRANSURETHRAL RESECTION OF PROSTATE  06/16/2011   Procedure: TRANSURETHRAL RESECTION OF THE PROSTATE (TURP);  Surgeon: Molli Hazard, MD;  Location[facility];  Service: Urology;  Laterality: N/A;  URETHRAL DILATATION, MEATOTOMY, PENIL BLOCK    There were no vitals filed for this visit.   Subjective Assessment - 10/02/21 1016     Subjective No new issues to report, feels that exercises are helping energy levels. BP measurement taken after aerobic and coordination drills which reveals: 137/76 mmHg, 65 bpm    Pertinent History PMH:  A-fib, bladder ca, GERD, OSA, basal cell skin ca, cervical fusion    Patient Stated Goals Pt's goals for therapy are "nothing that I know of."    Currently in Pain? No/denies                [facility] PT Assessment - 10/02/21 0001       Assessment   Medical Diagnosis Parkinson's disease                           [facility] Adult PT Treatment/Exercise - 10/02/21 0001       Neuro Re-ed    Neuro Re-ed Details  side-side weight shift with overhead reach  3x10. Staggered stance with overhead reach 3x10. Standing reach to opposite side w/ weight shift 3x10 with emphasis on increasing speed each set. Suitcase carry march with 10# dumbell 4x20 ft for SLS and trunk stability. Alternating march/UE w/ 3# ankle weights 4x20 ft. Large ROM sidestepping 4x20 ft 3# ankle weights. Anticipatory balance on airex pad 6x30 sec withstanding moderate postural perturbations to facilitate righting reactions. Static standing on airex 4x30 sec eyes closed to enhance proprioceptive awareness      Knee/Hip Exercises: Aerobic   Nustep level 7 x 5 min for heavy work/alternating movement      Knee/Hip Exercises: Seated   Long Arc Quad Strengthening;Both;3 sets;10 reps    Long Arc Quad Weight 3 lbs.                       PT Short Term Goals - 09/18/21 0907       PT SHORT TERM GOAL #1   Title Pt will perform HEP with  family supervision for improved strength, balance, transfers, and gait.  TARGET 10/16/2021    Time 4    Period Weeks    Status New      PT SHORT TERM GOAL #2   Title Pt will improve 5x sit<>stand to less than or equal to 12.5 sec to demonstrate improved functional strength and transfer efficiency.    Baseline 13.84    Time 4    Period Weeks    Status New               PT Long Term Goals - 09/18/21 0908       PT LONG TERM GOAL #1   Title Pt will perform progression of HEP with family supervision for improved strength, balance, transfers, and gait.  TARGET 10/30/2021    Time 6    Period Weeks    Status New      PT LONG TERM GOAL #2   Title Pt will improve MiniBESTest score to at least 22/28 to decrease fall risk.    Baseline 18/28    Time 6    Period Weeks    Status New      PT LONG TERM GOAL #3   Title Pt will improve TUG cog score to less than or equal to 15 sec for decreased fall risk.    Baseline 17.43 sec    Time 6    Period Weeks    Status New      PT LONG TERM GOAL #4   Title Pt will verbalize understanding of local Parkinsons disease community resources.    Time 6    Period Weeks    Status New                   Plan - 10/02/21 1107     Clinical Impression Statement Exhibits difficulty with coordination of alternating, contralateral movements requiring 75-100% verbal/visual cues to sequence proper form. Progressing with current HEP activities that include large amplitude and reciprocal movement patterns with today's emphasis being increased speed following initial 2 sets of exercises to improve automaticity of movements but demonstrates decomposition of movement and pattern with increased speed and/or decrease in cues from therapist. During balance activity demonstrates absent righting reactions when presented with moderate anterior to posterior directed force resulting in retro-LOB 100% of trials requiring therapist to correct/stabilize. Continued  sessions indicated to progress motor control/coordination activities to improve functional mobility and reduce risk for falls    Personal Factors and  Comorbidities Comorbidity 3+    Comorbidities PMH:  A-fib, bladder ca, GERD, OSA, basal cell skin ca, cervical fusion    Examination-Activity Limitations Locomotion Level;Transfers    Examination-Participation Restrictions Community Activity    Stability/Clinical Decision Making Evolving/Moderate complexity    Rehab Potential Good    PT Frequency 2x / week    PT Duration 6 weeks   plus eval   PT Treatment/Interventions ADLs/Self Care Home Management;Gait training;Functional mobility training;Therapeutic activities;Therapeutic exercise;Balance training;Neuromuscular re-education;DME Instruction;Patient/family education    PT Next Visit Plan Try again PWR! Moves standing and PWR! up in sitting (add to HEP as apporpriate); gait training focus on arm swing, foot clearance, rapid/large movement patterns    PT Home Exercise Plan [id number] Sit to stand PWR! up, sidestepping with big movement (lateral jumping jack), lateral weight shift with reach, stride stance with reach opposite,    Consulted and Agree with Plan of Care Patient             Patient will benefit from skilled therapeutic intervention in order to improve the following deficits and impairments:  Abnormal gait, Difficulty walking, Decreased balance, Decreased mobility, Decreased strength, Postural dysfunction  Visit Diagnosis: Unsteadiness on feet  Abnormal posture  Other abnormalities of gait and mobility  Other symptoms and signs involving the nervous system     Problem List Patient Active Problem List   Diagnosis Date Noted   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 [place], [place], [address] Phone: [phone]   Fax:  [phone]  Name: GREYSIN MEDLEN MRN: [id number] Date of Birth: [birth year]

## 2021-10-06 ENCOUNTER — Ambulatory Visit: Payer: Medicare PPO

## 2021-10-06 ENCOUNTER — Other Ambulatory Visit: Payer: Self-pay

## 2021-10-06 DIAGNOSIS — R2689 Other abnormalities of gait and mobility: Secondary | ICD-10-CM

## 2021-10-06 DIAGNOSIS — R29818 Other symptoms and signs involving the nervous system: Secondary | ICD-10-CM | POA: Diagnosis not present

## 2021-10-06 DIAGNOSIS — R293 Abnormal posture: Secondary | ICD-10-CM | POA: Diagnosis not present

## 2021-10-06 DIAGNOSIS — Z961 Presence of intraocular lens: Secondary | ICD-10-CM | POA: Diagnosis not present

## 2021-10-06 DIAGNOSIS — R2681 Unsteadiness on feet: Secondary | ICD-10-CM

## 2021-10-06 NOTE — Therapy (Signed)
[place] ?[facility] ?[address], [address] ?[place], [place], [address] ?Phone: [phone]   Fax:  [phone] ? ?Physical Therapy Treatment ? ?Patient Details  ?Name: Ryan Woodward ?MRN: [id number] ?Date of Birth: 1945/12/06 ?Referring Provider (PT): Jaynee Eagles ? ? ?Encounter Date: 10/06/2021 ? ? PT End of Session - 10/06/21 1020   ? ? Visit Number 6   ? Number of Visits 13   ? Date for PT Re-Evaluation 10/30/21   ? Authorization Type [employer] Medicare-submitted auth request at eval   ? Progress Note Due on Visit 10   ? PT Start Time 1015   ? PT Stop Time 1050   needs to leave early  ? PT Time Calculation (min) 35 min   ? Activity Tolerance Patient tolerated treatment well   ? Behavior During Therapy [facility] for tasks assessed/performed   ? ?  ?  ? ?  ? ? ?Past Medical History:  ?Diagnosis Date  ? A-fib ([place])   ? isolated episode during colonoscopy  ? Allergies   ? Back pain   ? Bladder cancer [facility]) dx aug'12  ? s/p turbt  ? Enlarged prostate with urinary obstruction   ? nocturia/ freq/ dysuria  ? GERD (gastroesophageal reflux disease)   ? OSA on CPAP   ? wears CPAP nightly  ? PD (Parkinson's disease) ([place])   ? Skin cancer of nose 05/2018  ? basal cell  ? ? ?Past Surgical History:  ?Procedure Laterality Date  ? CERVICAL FUSION  2000  ? C4-5  ? COLONOSCOPY    ? CYSTOSCOPY  06/16/2011  ? Procedure: CYSTOSCOPY;  Surgeon: Molli Hazard, MD;  Location: [facility];  Service: Urology;  Laterality: N/A;  ? CYSTOSCOPY WITH URETHRAL DILATATION  w/ TURBT BX   05-04-11  ? INGUINAL HERNIA REPAIR Bilateral 02/07/2019  ? Procedure: BILATERAL INGUINAL HERNIA REPAIR WITH MESH;  Surgeon: Donnie Mesa, MD;  Location: [place];  Service: General;  Laterality: Bilateral;  GENERAL AND TAP BLOCK ANESTHESIA  ? INSERTION OF MESH Bilateral 02/07/2019  ? Procedure: INSERTION OF MESH;  Surgeon: Donnie Mesa, MD;  Location: [place];  Service: General;  Laterality:  Bilateral;  ? SINUS SURGERY    ? TRANSURETHRAL RESECTION OF PROSTATE  06/16/2011  ? Procedure: TRANSURETHRAL RESECTION OF THE PROSTATE (TURP);  Surgeon: Molli Hazard, MD;  Location: [facility];  Service: Urology;  Laterality: N/A;  URETHRAL DILATATION, MEATOTOMY, PENIL BLOCK  ? ? ?There were no vitals filed for this visit. ? ? Subjective Assessment - 10/06/21 1030   ? ? Subjective Feeling pretty good. Need to leave session a little early for other appointment   ? Pertinent History PMH:  A-fib, bladder ca, GERD, OSA, basal cell skin ca, cervical fusion   ? Patient Stated Goals Pt's goals for therapy are "nothing that I know of."   ? Currently in Pain? No/denies   ? ?  ?  ? ?  ? ? ? ? ? ? ? ? ? ? ? ? ? ? ? ? ? ? ? ? [place] Adult PT Treatment/Exercise - 10/06/21 0001   ? ?  ? Transfers  ? Transfers Sit to Stand;Stand to Sit   ? Sit to Stand 7: Independent   ? Number of Reps Other reps (comment)   3x10 with PWR up posture  ?  ? Neuro Re-ed   ? Neuro Re-ed Details  side-side weight shift and reach ipsilateral 3x10.  Staggered stance and reach (contralateral) x 5 min. Cross-body reach 3x10 for trunk rotation and reciprocal coordination   ?  ? Knee/Hip Exercises: Aerobic  ? Nustep level 6 x 6 min 70-80 SPM to improve rapid, alternating movements and activity tolerance   ? ?  ?  ? ?  ? ? ? ? ? ? ? ? ? ? ? ? PT Short Term Goals - 09/18/21 0907   ? ?  ? PT SHORT TERM GOAL #1  ? Title Pt will perform HEP with family supervision for improved strength, balance, transfers, and gait.  TARGET 10/16/2021   ? Time 4   ? Period Weeks   ? Status New   ?  ? PT SHORT TERM GOAL #2  ? Title Pt will improve 5x sit<>stand to less than or equal to 12.5 sec to demonstrate improved functional strength and transfer efficiency.   ? Baseline 13.84   ? Time 4   ? Period Weeks   ? Status New   ? ?  ?  ? ?  ? ? ? ? PT Long Term Goals - 09/18/21 0908   ? ?  ? PT LONG TERM GOAL #1  ? Title Pt will perform progression of HEP with  family supervision for improved strength, balance, transfers, and gait.  TARGET 10/30/2021   ? Time 6   ? Period Weeks   ? Status New   ?  ? PT LONG TERM GOAL #2  ? Title Pt will improve MiniBESTest score to at least 22/28 to decrease fall risk.   ? Baseline 18/28   ? Time 6   ? Period Weeks   ? Status New   ?  ? PT LONG TERM GOAL #3  ? Title Pt will improve TUG cog score to less than or equal to 15 sec for decreased fall risk.   ? Baseline 17.43 sec   ? Time 6   ? Period Weeks   ? Status New   ?  ? PT LONG TERM GOAL #4  ? Title Pt will verbalize understanding of local Parkinson?s disease community resources.   ? Time 6   ? Period Weeks   ? Status New   ? ?  ?  ? ?  ? ? ? ? ? ? ? ? Plan - 10/06/21 1059   ? ? Clinical Impression Statement Pronounced difficulty with coordinating alternating contralateral movements (e.g. staggered stance stepping right foot/raise left arm). Therapist using verbal, visual, tactile cues but little carryover noted. Use of mirror for visualization did not improve ability, nor did writing movement cues on mirror for reference. Difficulty with weight shifting and maintaining balance in single limb position requiring double limb support throughout activities. Will attempt color-coded bands on extremeties to see if this improves reciprocal movement combinations in order to progress for more dynamic and rapid movements for max benefit. Continued sessions indicated for program development to implement balance and gait activities to reduce risk for falls and improve amplitude of gait to enable greater safety and efficiency   ? Personal Factors and Comorbidities Comorbidity 3+   ? Comorbidities PMH:  A-fib, bladder ca, GERD, OSA, basal cell skin ca, cervical fusion   ? Examination-Activity Limitations Locomotion Level;Transfers   ? Examination-Participation Restrictions Community Activity   ? Stability/Clinical Decision Making Evolving/Moderate complexity   ? Rehab Potential Good   ? PT Frequency  2x / week   ? PT Duration 6 weeks   plus eval  ? PT Treatment/Interventions  ADLs/Self Care Home Management;Gait training;Functional mobility training;Therapeutic activities;Therapeutic exercise;Balance training;Neuromuscular re-education;DME Instruction;Patient/family education   ? PT Next Visit Plan Try again PWR! Moves standing and PWR! up in sitting (add to HEP as apporpriate); gait training focus on arm swing, foot clearance, rapid/large movement patterns   ? PT Home Exercise Plan [id number] Sit to stand PWR! up, sidestepping with big movement (lateral jumping jack), lateral weight shift with reach, stride stance with reach opposite,   ? Consulted and Agree with Plan of Care Patient   ? ?  ?  ? ?  ? ? ?Patient will benefit from skilled therapeutic intervention in order to improve the following deficits and impairments:  Abnormal gait, Difficulty walking, Decreased balance, Decreased mobility, Decreased strength, Postural dysfunction ? ?Visit Diagnosis: ?Unsteadiness on feet ? ?Abnormal posture ? ?Other abnormalities of gait and mobility ? ?Other symptoms and signs involving the nervous system ? ? ? ? ?Problem List ?Patient Active Problem List  ? Diagnosis Date Noted  ? 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 Gait abnormality 08/10/2021  ? Feels cold 08/10/2021  ? Hallucinations 08/10/2021  ? Preop cardiovascular exam 01/17/2019  ? Paroxysmal atrial fibrillation ([place]) 09/20/2018  ? Obstructive sleep apnea 09/20/2018  ? Palpitations 09/20/2018  ? Parkinson's disease ([place]) 08/11/2018  ? Memory loss 05/24/2018  ? Benign prostate hyperplasia 06/16/2011  ? ? ?Toniann Fail, PT ?10/06/2021, 11:12 AM ? ?[place] ?[facility] ?[address], [address] ?[place], [place], [address] ?Phone: [phone]   Fax:  [phone] ? ?Name: Ryan Woodward ?MRN: [id number] ?Date of Birth: 1946/03/21 ? ? ? ?

## 2021-10-09 ENCOUNTER — Ambulatory Visit: Payer: Medicare PPO | Admitting: Physical Therapy

## 2021-10-09 ENCOUNTER — Encounter: Payer: Self-pay | Admitting: Physical Therapy

## 2021-10-09 ENCOUNTER — Other Ambulatory Visit: Payer: Self-pay

## 2021-10-09 DIAGNOSIS — R29818 Other symptoms and signs involving the nervous system: Secondary | ICD-10-CM

## 2021-10-09 DIAGNOSIS — R293 Abnormal posture: Secondary | ICD-10-CM | POA: Diagnosis not present

## 2021-10-09 DIAGNOSIS — R2681 Unsteadiness on feet: Secondary | ICD-10-CM | POA: Diagnosis not present

## 2021-10-09 DIAGNOSIS — R2689 Other abnormalities of gait and mobility: Secondary | ICD-10-CM

## 2021-10-09 NOTE — Therapy (Signed)
[facility], [place], [place], [address] Phone: [phone]   Fax:  [phone]  Physical Therapy Treatment  Patient Details  Name: Ryan Woodward MRN: [id number] Date of Birth: 25-Dec-1945 Referring Provider (PT): Jaynee Eagles   Encounter Date: 10/09/2021   PT End of Session - 10/09/21 1023     Visit Number 7    Number of Visits 13    Date for PT Re-Evaluation 10/30/21    Authorization Type [employer] Medicare-submitted auth request at eval    Progress Note Due on Visit 10    PT Start Time 1021    PT Stop Time 1100    PT Time Calculation (min) 39 min    Activity Tolerance Patient tolerated treatment well    Behavior During Therapy [facility] for tasks assessed/performed             Past Medical History:  Diagnosis Date   A-fib ([place])    isolated episode during colonoscopy   Allergies    Back pain    Bladder cancer ([place]) dx aug'12   s/p turbt   Enlarged prostate with urinary obstruction    nocturia/ freq/ dysuria   GERD (gastroesophageal reflux disease)    OSA on CPAP    wears CPAP nightly   PD (Parkinson's disease) ([place])    Skin cancer of nose 05/2018   basal cell    Past Surgical History:  Procedure Laterality Date   CERVICAL FUSION  2000   C4-5   COLONOSCOPY     CYSTOSCOPY  06/16/2011   Procedure: CYSTOSCOPY;  Surgeon: Molli Hazard, MD;  Location: [facility];  Service: Urology;  Laterality: N/A;   CYSTOSCOPY WITH URETHRAL DILATATION  w/ TURBT BX   05-04-11   INGUINAL HERNIA REPAIR Bilateral 02/07/2019   Procedure: BILATERAL INGUINAL HERNIA REPAIR WITH MESH;  Surgeon: Donnie Mesa, MD;  Location: [place];  Service: General;  Laterality: Bilateral;  GENERAL AND TAP BLOCK ANESTHESIA   INSERTION OF MESH Bilateral 02/07/2019   Procedure: INSERTION OF MESH;  Surgeon: Donnie Mesa, MD;  Location: [place];  Service: General;  Laterality: Bilateral;   SINUS  SURGERY     TRANSURETHRAL RESECTION OF PROSTATE  06/16/2011   Procedure: TRANSURETHRAL RESECTION OF THE PROSTATE (TURP);  Surgeon: Molli Hazard, MD;  Location: [facility];  Service: Urology;  Laterality: N/A;  URETHRAL DILATATION, MEATOTOMY, PENIL BLOCK    There were no vitals filed for this visit.   Subjective Assessment - 10/09/21 1022     Subjective No falls, but felt unsteady with coming down stairs off of a stage.  No other falls.    Pertinent History PMH:  A-fib, bladder ca, GERD, OSA, basal cell skin ca, cervical fusion    Patient Stated Goals Pt's goals for therapy are "nothing that I know of."    Currently in Pain? No/denies                               [facility] Adult PT Treatment/Exercise - 10/09/21 0001       Transfers   Transfers Sit to Stand;Stand to Sit    Sit to Stand 6: Modified independent (Device/Increase time);Without upper extremity assist;From chair/3-in-1    Stand to Sit Without upper extremity assist;To chair/3-in-1    Comments Sit<>Stand from mat height, 3 sets x 10 reps; cues for increased amplitude of movement  patterns; 1 set standing on [employer]      Ambulation/Gait   Stairs Yes    Stairs Assistance 5: Supervision    Stairs Assistance Details (indicate cue type and reason) Cues for full foot placement, for foot clearance    Stair Management Technique One rail Right;Alternating pattern      High Level Balance   High Level Balance Activities Side stepping    High Level Balance Comments Sidestep with coorodinated UE open/close, 20 ft x 3 reps R and L      Neuro Re-ed    Neuro Re-ed Details  side-side weight shift and reach ipsilateral 3x10, progressed challenge with head turns and then with shift and lift one leg (intermittent UE support as needed)  . Staggered stance and reach (contralateral) , needing assistance for hinge at hips and to coordinate UEs. Cross-body reach 3x10 for trunk rotation and reciprocal  coordination (performed at wall for targeted reach).  Forward step ups, each leg leading, at least 10 reps, cues for foot clearance.                     PT Education - 10/09/21 1055     Education Details PD local and online resources    Person(s) Educated Patient    Methods Explanation;Handout    Comprehension Verbalized understanding;Returned demonstration              PT Short Term Goals - 09/18/21 0907       PT SHORT TERM GOAL #1   Title Pt will perform HEP with family supervision for improved strength, balance, transfers, and gait.  TARGET 10/16/2021    Time 4    Period Weeks    Status New      PT SHORT TERM GOAL #2   Title Pt will improve 5x sit<>stand to less than or equal to 12.5 sec to demonstrate improved functional strength and transfer efficiency.    Baseline 13.84    Time 4    Period Weeks    Status New               PT Long Term Goals - 09/18/21 0908       PT LONG TERM GOAL #1   Title Pt will perform progression of HEP with family supervision for improved strength, balance, transfers, and gait.  TARGET 10/30/2021    Time 6    Period Weeks    Status New      PT LONG TERM GOAL #2   Title Pt will improve MiniBESTest score to at least 22/28 to decrease fall risk.    Baseline 18/28    Time 6    Period Weeks    Status New      PT LONG TERM GOAL #3   Title Pt will improve TUG cog score to less than or equal to 15 sec for decreased fall risk.    Baseline 17.43 sec    Time 6    Period Weeks    Status New      PT LONG TERM GOAL #4   Title Pt will verbalize understanding of local Parkinsons disease community resources.    Time 6    Period Weeks    Status New                   Plan - 10/09/21 1100     Clinical Impression Statement Continued work on dynamic balance activities focusing on weightshifting and coordination of UE movement patterns.  Began with exercises as part of HEP and progressed to incorporate head motions, UE  motions, pacing of movements.  He improves with repetition and cues through session, but he admits he is not consistently performing all exercises in HEP at home.  Provided education today on community PD resources.  He will continue to benefit from skilled PT to address balance and gait for imrpoved functional mobility and decreased fall risk.    Personal Factors and Comorbidities Comorbidity 3+    Comorbidities PMH:  A-fib, bladder ca, GERD, OSA, basal cell skin ca, cervical fusion    Examination-Activity Limitations Locomotion Level;Transfers    Examination-Participation Restrictions Community Activity    Stability/Clinical Decision Making Evolving/Moderate complexity    Rehab Potential Good    PT Frequency 2x / week    PT Duration 6 weeks   plus eval   PT Treatment/Interventions ADLs/Self Care Home Management;Gait training;Functional mobility training;Therapeutic activities;Therapeutic exercise;Balance training;Neuromuscular re-education;DME Instruction;Patient/family education    PT Next Visit Plan Gait training focus on arm swing, foot clearance, rapid/large movement patterns.  Invite care partner into session if able for education (pt is wanting to wrap up next week at end of scheduled appts, but he has 2 additional weeks in [place] and may benefit from additional visits plus partner education)    PT Home Exercise Plan [facility] Sit to stand PWR! up, sidestepping with big movement (lateral jumping jack), lateral weight shift with reach, stride stance with reach opposite,    Consulted and Agree with Plan of Care Patient             Patient will benefit from skilled therapeutic intervention in order to improve the following deficits and impairments:  Abnormal gait, Difficulty walking, Decreased balance, Decreased mobility, Decreased strength, Postural dysfunction  Visit Diagnosis: Unsteadiness on feet  Other abnormalities of gait and mobility  Other symptoms and signs involving the nervous  system     Problem List Patient Active Problem List   Diagnosis Date Noted   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 ([place]) 08/10/2021   Gait abnormality 08/10/2021   Feels cold 08/10/2021   Hallucinations 08/10/2021   Preop cardiovascular exam 01/17/2019   Paroxysmal atrial fibrillation ([place]) 09/20/2018   Obstructive sleep apnea 09/20/2018   Palpitations 09/20/2018   Parkinson's disease ([place]) 08/11/2018   Memory loss 05/24/2018   Benign prostate hyperplasia 06/16/2011    Prince Olivier W., PT 10/09/2021, 11:06 AM  [facility] [address] [place], [place], [address] Phone: [phone]   Fax:  [phone]  Name: Ryan Woodward MRN: [id number] Date of Birth: April 14, 1946

## 2021-10-13 ENCOUNTER — Other Ambulatory Visit: Payer: Self-pay

## 2021-10-13 ENCOUNTER — Ambulatory Visit: Payer: Medicare PPO

## 2021-10-13 DIAGNOSIS — R293 Abnormal posture: Secondary | ICD-10-CM | POA: Diagnosis not present

## 2021-10-13 DIAGNOSIS — R2689 Other abnormalities of gait and mobility: Secondary | ICD-10-CM

## 2021-10-13 DIAGNOSIS — R2681 Unsteadiness on feet: Secondary | ICD-10-CM | POA: Diagnosis not present

## 2021-10-13 DIAGNOSIS — R29818 Other symptoms and signs involving the nervous system: Secondary | ICD-10-CM | POA: Diagnosis not present

## 2021-10-13 NOTE — Therapy (Signed)
?[facility] ?[address], [address] ?[place], [place], [address] ?Phone: [phone]   Fax:  [phone] ? ?Physical Therapy Treatment ? ?Patient Details  ?Name: Ryan Woodward ?MRN: [id number] ?Date of Birth: Dec 07, 1945 ?Referring Provider (PT): Jaynee Eagles ? ? ?Encounter Date: 10/13/2021 ? ? PT End of Session - 10/13/21 1015   ? ? Visit Number 8   ? Number of Visits 13   ? Date for PT Re-Evaluation 10/30/21   ? Authorization Type [employer] Medicare-submitted auth request at eval   ? Progress Note Due on Visit 10   ? PT Start Time 1015   ? PT Stop Time 1100   ? PT Time Calculation (min) 45 min   ? Activity Tolerance Patient tolerated treatment well   ? Behavior During Therapy [facility] for tasks assessed/performed   ? ?  ?  ? ?  ? ? ?Past Medical History:  ?Diagnosis Date  ? A-fib ([place])   ? isolated episode during colonoscopy  ? Allergies   ? Back pain   ? Bladder cancer [facility]) dx aug'12  ? s/p turbt  ? Enlarged prostate with urinary obstruction   ? nocturia/ freq/ dysuria  ? GERD (gastroesophageal reflux disease)   ? OSA on CPAP   ? wears CPAP nightly  ? PD (Parkinson's disease) ([place])   ? Skin cancer of nose 05/2018  ? basal cell  ? ? ?Past Surgical History:  ?Procedure Laterality Date  ? CERVICAL FUSION  2000  ? C4-5  ? COLONOSCOPY    ? CYSTOSCOPY  06/16/2011  ? Procedure: CYSTOSCOPY;  Surgeon: Molli Hazard, MD;  Location: [facility];  Service: Urology;  Laterality: N/A;  ? CYSTOSCOPY WITH URETHRAL DILATATION  w/ TURBT BX   05-04-11  ? INGUINAL HERNIA REPAIR Bilateral 02/07/2019  ? Procedure: BILATERAL INGUINAL HERNIA REPAIR WITH MESH;  Surgeon: Donnie Mesa, MD;  Location: [place];  Service: General;  Laterality: Bilateral;  GENERAL AND TAP BLOCK ANESTHESIA  ? INSERTION OF MESH Bilateral 02/07/2019  ? Procedure: INSERTION OF MESH;  Surgeon: Donnie Mesa, MD;  Location: [place];  Service: General;  Laterality: Bilateral;  ? SINUS  SURGERY    ? TRANSURETHRAL RESECTION OF PROSTATE  06/16/2011  ? Procedure: TRANSURETHRAL RESECTION OF THE PROSTATE (TURP);  Surgeon: Molli Hazard, MD;  Location: [facility];  Service: Urology;  Laterality: N/A;  URETHRAL DILATATION, MEATOTOMY, PENIL BLOCK  ? ? ?There were no vitals filed for this visit. ? ? Subjective Assessment - 10/13/21 1041   ? ? Subjective Difficulty walking backwards and negotiating tight spaces   ? Pertinent History PMH:  A-fib, bladder ca, GERD, OSA, basal cell skin ca, cervical fusion   ? Patient Stated Goals Pt's goals for therapy are "nothing that I know of."   ? ?  ?  ? ?  ? ? ? ? ? ? ? ? ? ? ? ? ? ? ? ? ? ? ? ? [place] Adult PT Treatment/Exercise - 10/13/21 0001   ? ?  ? Ambulation/Gait  ? Ambulation/Gait Yes   ? Ambulation/Gait Assistance 7: Independent   cues for increased speed and arm swing  ? Gait Comments gait performed after reciprocal movement drill with emphasis and cues on fast pace and large amplitude arm swing and quick 180 degree turns to normalize gait pattern   ?  ? Neuro Re-ed   ? Neuro Re-ed Details  side-side weight shifting reach 3x10  with hurdle between legs to prevent sidestepping. 4-square step over 6" hurdle 5x clockwise/counter. Alternating reciprocal extremity 3x2 min standing and sitting using color-code bands on wrist/foot for visual reference and mirror for feedback   ?  ? Knee/Hip Exercises: Aerobic  ? Nustep level 8 x 5 min for heavy work, alternating movements   ? ?  ?  ? ?  ? ? ? ? ? ? ? ? ? ? ? ? PT Short Term Goals - 09/18/21 0907   ? ?  ? PT SHORT TERM GOAL #1  ? Title Pt will perform HEP with family supervision for improved strength, balance, transfers, and gait.  TARGET 10/16/2021   ? Time 4   ? Period Weeks   ? Status New   ?  ? PT SHORT TERM GOAL #2  ? Title Pt will improve 5x sit<>stand to less than or equal to 12.5 sec to demonstrate improved functional strength and transfer efficiency.   ? Baseline 13.84   ? Time 4   ?  Period Weeks   ? Status New   ? ?  ?  ? ?  ? ? ? ? PT Long Term Goals - 09/18/21 0908   ? ?  ? PT LONG TERM GOAL #1  ? Title Pt will perform progression of HEP with family supervision for improved strength, balance, transfers, and gait.  TARGET 10/30/2021   ? Time 6   ? Period Weeks   ? Status New   ?  ? PT LONG TERM GOAL #2  ? Title Pt will improve MiniBESTest score to at least 22/28 to decrease fall risk.   ? Baseline 18/28   ? Time 6   ? Period Weeks   ? Status New   ?  ? PT LONG TERM GOAL #3  ? Title Pt will improve TUG cog score to less than or equal to 15 sec for decreased fall risk.   ? Baseline 17.43 sec   ? Time 6   ? Period Weeks   ? Status New   ?  ? PT LONG TERM GOAL #4  ? Title Pt will verbalize understanding of local Parkinson?s disease community resources.   ? Time 6   ? Period Weeks   ? Status New   ? ?  ?  ? ?  ? ? ? ? ? ? ? ? Plan - 10/13/21 1123   ? ? Clinical Impression Statement Demo improved coordination and self-monitoring using color-coded extremity strategy which facilitated greater ability to self-monitor and performing rapid, alternating movements after slow, measured practice in sitting then standing, to fast pace. Tapered feedback employed throughout session with greater self-monitoring appreciated. Good carryover to gait activity by end of session demonstrating reciprocal arm swing and ambulation with increased velocity and foot clearance. Pt notes onset of fatigue with fast walking needing rest periods Q 2 min with increased velocity. Demonstrates increased difficulty with walking backwards, especially over obstacles demonstrating 25% success rate during 4 square step activity. Continued sessions to enhance motor control, coordination, and dynamic balance to reduce risk for falls   ? Personal Factors and Comorbidities Comorbidity 3+   ? Comorbidities PMH:  A-fib, bladder ca, GERD, OSA, basal cell skin ca, cervical fusion   ? Examination-Activity Limitations Locomotion Level;Transfers    ? Examination-Participation Restrictions Community Activity   ? Stability/Clinical Decision Making Evolving/Moderate complexity   ? Rehab Potential Good   ? PT Frequency 2x / week   ? PT Duration 6 weeks  plus eval  ? PT Treatment/Interventions ADLs/Self Care Home Management;Gait training;Functional mobility training;Therapeutic activities;Therapeutic exercise;Balance training;Neuromuscular re-education;DME Instruction;Patient/family education   ? PT Next Visit Plan Gait training focus on arm swing, foot clearance, rapid/large movement patterns.  Invite care partner into session if able for education (pt is wanting to wrap up next week at end of scheduled appts, but he has 2 additional weeks in [place] and may benefit from additional visits plus partner education)   ? PT Home Exercise Plan [id number] Sit to stand PWR! up, sidestepping with big movement (lateral jumping jack), lateral weight shift with reach, stride stance with reach opposite,   ? Consulted and Agree with Plan of Care Patient   ? ?  ?  ? ?  ? ? ?Patient will benefit from skilled therapeutic intervention in order to improve the following deficits and impairments:  Abnormal gait, Difficulty walking, Decreased balance, Decreased mobility, Decreased strength, Postural dysfunction ? ?Visit Diagnosis: ?Unsteadiness on feet ? ?Other abnormalities of gait and mobility ? ?Other symptoms and signs involving the nervous system ? ?Abnormal posture ? ? ? ? ?Problem List ?Patient Active Problem List  ? Diagnosis Date Noted  ? 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 Gait abnormality 08/10/2021  ? Feels cold 08/10/2021  ? Hallucinations 08/10/2021  ? Preop cardiovascular exam 01/17/2019  ? Paroxysmal atrial fibrillation ([place]) 09/20/2018  ? Obstructive sleep apnea 09/20/2018  ? Palpitations 09/20/2018  ? Parkinson's disease ([place]) 08/11/2018  ? Memory loss 05/24/2018  ? Benign prostate hyperplasia 06/16/2011  ? ? ?Toniann Fail, PT ?10/13/2021, 11:28  AM ? ?[facility] ?[address], [address] ?[place], [place], [address] ?Phone: [phone]   Fax:  [phone] ? ?Name: Ryan Woodward ?MRN: [id number] ?Date of Birth: [birth year]

## 2021-10-16 ENCOUNTER — Ambulatory Visit: Payer: Medicare PPO | Admitting: Physical Therapy

## 2021-10-16 ENCOUNTER — Other Ambulatory Visit: Payer: Self-pay

## 2021-10-16 ENCOUNTER — Encounter: Payer: Self-pay | Admitting: Physical Therapy

## 2021-10-16 DIAGNOSIS — R29818 Other symptoms and signs involving the nervous system: Secondary | ICD-10-CM | POA: Diagnosis not present

## 2021-10-16 DIAGNOSIS — R2689 Other abnormalities of gait and mobility: Secondary | ICD-10-CM | POA: Diagnosis not present

## 2021-10-16 DIAGNOSIS — R2681 Unsteadiness on feet: Secondary | ICD-10-CM | POA: Diagnosis not present

## 2021-10-16 DIAGNOSIS — R293 Abnormal posture: Secondary | ICD-10-CM | POA: Diagnosis not present

## 2021-10-16 NOTE — Patient Instructions (Signed)
Optimal Fitness Program after Therapy for People with Parkinson's Disease ? ?1)  Therapy Home Exercise Program ? -Do these Exercises DAILY as instructed by your therapist ? -Big, deliberate effort with exercises ? -These exercises are important to perform consistently, even when therapist has  finished, because these therapy exercises often address your specific  Parkinson's difficulties ? ? ?2)  Walking ? -  Work up to walking 3-5 times per week, 20-30 minutes per day ? -This can be done at home, driveway, quiet street or an indoor track ? -Focus should be on your Best posture, arm swing, step length for your best  walking pattern ? ?3)  Aerobic Exercise ? -Work up to 3-5 times per week, 30 minutes per day ? -This can be stationary bike, seated stepper machine, elliptical machine ? -Work up to 7-8/10 intensity during the exercise, at minimal to moderate    ? Resistance ?  ?  ? ?

## 2021-10-16 NOTE — Therapy (Signed)
[facility] ?[address], [address] ?[place], [place], [address] ?Phone: [phone]   Fax:  [phone] ? ?Physical Therapy Treatment/Discharge Summary ? ?Patient Details  ?Name: Ryan Woodward ?MRN: [id number] ?Date of Birth: [birth year] ?Referring Provider (PT): Jaynee Eagles ? ? ?PHYSICAL THERAPY DISCHARGE SUMMARY ? ?Visits from Start of Care: 9 ? ?Current functional level related to goals / functional outcomes: ? PT Long Term Goals - 10/16/21 1221   ? ?  ? PT LONG TERM GOAL #1  ? Title Pt will perform progression of HEP with family supervision for improved strength, balance, transfers, and gait.  TARGET 10/30/2021   ? Time 6   ? Period Weeks   ? Status Achieved   ?  ? PT LONG TERM GOAL #2  ? Title Pt will improve MiniBESTest score to at least 22/28 to decrease fall risk.   ? Baseline 18/28; 20/28 10/16/2021   ? Time 6   ? Period Weeks   ? Status Partially Met   ?  ? PT LONG TERM GOAL #3  ? Title Pt will improve TUG cog score to less than or equal to 15 sec for decreased fall risk.   ? Baseline 17.43 sec; 13.06 sec 10/16/2021   ? Time 6   ? Period Weeks   ? Status Achieved   ?  ? PT LONG TERM GOAL #4  ? Title Pt will verbalize understanding of local Parkinson?s disease community resources.   ? Time 6   ? Period Weeks   ? Status Achieved   ? ?  ?  ? ?  ? ? PT Short Term Goals - 10/16/21 1220   ? ?  ? PT SHORT TERM GOAL #1  ? Title Pt will perform HEP with family supervision for improved strength, balance, transfers, and gait.  TARGET 10/16/2021   ? Time 4   ? Period Weeks   ? Status Achieved   ?  ? PT SHORT TERM GOAL #2  ? Title Pt will improve 5x sit<>stand to less than or equal to 12.5 sec to demonstrate improved functional strength and transfer efficiency.   ? Baseline 13.84; 10 sec 10/16/2021   ? Time 4   ? Period Weeks   ? Status Achieved   ? ?  ?  ? ?  ? ? ?  ?Remaining deficits: ?Coordination, posture, gait, balance ?  ?Education / Equipment: ?Educated in [employer], community fitness/PD  resource info and optimal fitness for people with PD.   ? ?Patient agrees to discharge. Patient goals were partially met. Patient is being discharged due to being pleased with the current functional level.  Recommend PT return eval in 6-9 months due to progressive nature of disease.   ? Mady Haagensen, PT ?10/16/21 12:27 PM ?Phone: [phone] ?Fax: [phone]  ? ?Encounter Date: 10/16/2021 ? ? PT End of Session - 10/16/21 1021   ? ? Visit Number 9   ? Number of Visits 13   ? Date for PT Re-Evaluation 10/30/21   ? Authorization Type [employer] Medicare-submitted auth request at eval   ? Progress Note Due on Visit 10   ? PT Start Time 1020   ? PT Stop Time 1058   ? PT Time Calculation (min) 38 min   ? Activity Tolerance Patient tolerated treatment well   ? Behavior During Therapy [facility] for tasks assessed/performed   ? ?  ?  ? ?  ? ? ?Past Medical History:  ?Diagnosis Date  ? A-fib (  [place])   ? isolated episode during colonoscopy  ? Allergies   ? Back pain   ? Bladder cancer [facility]) dx aug'12  ? s/p turbt  ? Enlarged prostate with urinary obstruction   ? nocturia/ freq/ dysuria  ? GERD (gastroesophageal reflux disease)   ? OSA on CPAP   ? wears CPAP nightly  ? PD (Parkinson's disease) ([place])   ? Skin cancer of nose 05/2018  ? basal cell  ? ? ?Past Surgical History:  ?Procedure Laterality Date  ? CERVICAL FUSION  2000  ? C4-5  ? COLONOSCOPY    ? CYSTOSCOPY  06/16/2011  ? Procedure: CYSTOSCOPY;  Surgeon: Molli Hazard, MD;  Location: [facility];  Service: Urology;  Laterality: N/A;  ? CYSTOSCOPY WITH URETHRAL DILATATION  w/ TURBT BX   05-04-11  ? INGUINAL HERNIA REPAIR Bilateral 02/07/2019  ? Procedure: BILATERAL INGUINAL HERNIA REPAIR WITH MESH;  Surgeon: Donnie Mesa, MD;  Location: [facility];  Service: General;  Laterality: Bilateral;  GENERAL AND TAP BLOCK ANESTHESIA  ? INSERTION OF MESH Bilateral 02/07/2019  ? Procedure: INSERTION OF MESH;  Surgeon: Donnie Mesa, MD;  Location: [facility];  Service: General;  Laterality: Bilateral;  ? SINUS SURGERY    ? TRANSURETHRAL RESECTION OF PROSTATE  06/16/2011  ? Procedure: TRANSURETHRAL RESECTION OF THE PROSTATE (TURP);  Surgeon: Molli Hazard, MD;  Location: [facility];  Service: Urology;  Laterality: N/A;  URETHRAL DILATATION, MEATOTOMY, PENIL BLOCK  ? ? ?There were no vitals filed for this visit. ? ? Subjective Assessment - 10/16/21 1023   ? ? Subjective Doing well.  No changes.   ? Pertinent History PMH:  A-fib, bladder ca, GERD, OSA, basal cell skin ca, cervical fusion   ? Patient Stated Goals Pt's goals for therapy are "nothing that I know of."   ? Currently in Pain? No/denies   ? ?  ?  ? ?  ? ? ? ? ? ? ? ? ? ? ? ? ? ? ? ? ? ? ? ? [facility] Adult PT Treatment/Exercise - 10/16/21 0001   ? ?  ? Transfers  ? Transfers Sit to Stand;Stand to Sit   ? Sit to Stand 6: Modified independent (Device/Increase time);Without upper extremity assist;From chair/3-in-1   ? Five time sit to stand comments  10   ? Stand to Sit Without upper extremity assist;To chair/3-in-1   ?  ? Ambulation/Gait  ? Ambulation/Gait Yes   ? Ambulation/Gait Assistance 7: Independent   ? Gait velocity 9.12 sec = 3.6 ft/sec   ?  ? Standardized Balance Assessment  ? Standardized Balance Assessment Timed Up and Go Test   ?  ? Timed Up and Go Test  ? TUG Normal TUG;Cognitive TUG   ? Normal TUG (seconds) 12.28   ? Cognitive TUG (seconds) 13.06   stops counting  ?  ? Mini-BESTest  ? Sit To Stand Normal: Comes to stand without use of hands and stabilizes independently.   ? Rise to Toes Normal: Stable for 3 s with maximum height.   ? Stand on one leg (left) Moderate: < 20 s   11.06, 5.15  ? Stand on one leg (right) Normal: 20 s.   ? Stand on one leg - lowest score 1   ? Compensatory Stepping Correction - Forward Normal: Recovers independently with a single, large step (second realignement is allowed).   ? Compensatory Stepping Correction - Backward No step, OR  would  fall if not caught, OR falls spontaneously.   ? Compensatory Stepping Correction - Left Lateral Severe: Falls, or cannot step   no step  ? Compensatory Stepping Correction - Right Lateral Severe:  Falls, or cannot step   no step  ? Stepping Corredtion Lateral - lowest score 0   ? Stance - Feet together, eyes open, firm surface  Normal: 30s   ? Stance - Feet together, eyes closed, foam surface  Moderate: < 30s   9 sec, LOB trying to come off cushion  ? Incline - Eyes Closed Normal: Stands independently 30s and aligns with gravity   ? Change in Gait Speed Normal: Significantly changes walkling speed without imbalance   ? Walk with head turns - Horizontal Normal: performs head turns with no change in gait speed and good balance   ? Walk with pivot turns Normal: Turns with feet close FAST (< 3 steps) with good balance.   ? Step over obstacles Normal: Able to step over box with minimal change of gait speed and with good balance.   ? Timed UP & GO with Dual Task Severe: Stops counting while walking OR stops walking while counting.   ? Mini-BEST total score 20   ?  ? Self-Care  ? Self-Care Other Self-Care Comments   ? Other Self-Care Comments  Discussed progress towards goals, POC.  Discussed POC goes for 2 additional weeks, but pt wants to wrap up with PT today.  Discussed optimal fitness programs for PD, reviewed online/local offerings, and reinforced improtance of continued, consistent exercise.  Pt in agreement for return eval in 6-9 months.   ?  ? Neuro Re-ed   ? Neuro Re-ed Details  Reviewed pt's HEP:  sit to stand x 10, side stepp with coordinated arm motions 15 ft to R and L; lateral weigthshift and same side reach.  Pt performs independently with initial reminder cues for each exercise picture   ?  ? Knee/Hip Exercises: Aerobic  ? Nustep Level 6, 7 minutes, for aerobic warm up activity 4 extremities, cues to keep steps/min >70-80   ? ?  ?  ? ?  ? ? ? ? ? ? ? ? ? ? PT Education - 10/16/21 1053   ? ? Education  Details Progress towards goals, PD optimal fitness.  Discussed PT return eval, as pt wants to d/c today from PT   ? Person(s) Educated Patient   ? Methods Explanation   ? Comprehension Verbalized understand

## 2021-10-24 ENCOUNTER — Other Ambulatory Visit: Payer: Self-pay | Admitting: Neurology

## 2021-11-11 ENCOUNTER — Other Ambulatory Visit: Payer: Self-pay | Admitting: Neurology

## 2021-11-11 MED ORDER — CLONAZEPAM 0.5 MG PO TABS: ORAL_TABLET | ORAL | 2 refills | Status: DC

## 2021-11-12 ENCOUNTER — Other Ambulatory Visit: Payer: Self-pay | Admitting: *Deleted

## 2021-11-12 ENCOUNTER — Telehealth: Payer: Self-pay | Admitting: Neurology

## 2021-11-12 MED ORDER — CLONAZEPAM 0.5 MG PO TABS: ORAL_TABLET | ORAL | 2 refills | Status: DC

## 2021-11-12 NOTE — Telephone Encounter (Signed)
Prescription has been signed by Dr.Dohmier faxed ClonazePAM prescription to [facility] in [place] . Received fax  confirmation  ?

## 2021-11-12 NOTE — Telephone Encounter (Signed)
I find this prescription is automatically directed to print, no e script.  ?

## 2021-11-12 NOTE — Telephone Encounter (Signed)
This printed when Dr Brett Fairy tried to e-scribe. Will be signed and faxed to pharmacy. ?

## 2021-11-12 NOTE — Telephone Encounter (Signed)
Since the clonazePAM (KLONOPIN) 0.5 MG tablet, is out of refills and shows to now be discontinued.  Pt's spouse is asking for a call as to what pt will take in place of the clonazePAM (KLONOPIN) 0.5 MG tablet ?

## 2021-12-29 DIAGNOSIS — M5031 Other cervical disc degeneration,  high cervical region: Secondary | ICD-10-CM | POA: Diagnosis not present

## 2021-12-29 DIAGNOSIS — M9901 Segmental and somatic dysfunction of cervical region: Secondary | ICD-10-CM | POA: Diagnosis not present

## 2021-12-29 DIAGNOSIS — M9903 Segmental and somatic dysfunction of lumbar region: Secondary | ICD-10-CM | POA: Diagnosis not present

## 2021-12-29 DIAGNOSIS — M5136 Other intervertebral disc degeneration, lumbar region: Secondary | ICD-10-CM | POA: Diagnosis not present

## 2022-01-23 ENCOUNTER — Other Ambulatory Visit: Payer: Self-pay | Admitting: Neurology

## 2022-02-05 ENCOUNTER — Other Ambulatory Visit: Payer: Self-pay

## 2022-02-05 ENCOUNTER — Emergency Department (HOSPITAL_BASED_OUTPATIENT_CLINIC_OR_DEPARTMENT_OTHER): Payer: Medicare PPO

## 2022-02-05 ENCOUNTER — Emergency Department (HOSPITAL_BASED_OUTPATIENT_CLINIC_OR_DEPARTMENT_OTHER)
Admission: EM | Admit: 2022-02-05 | Discharge: 2022-02-05 | Disposition: A | Discharge status: Home / Self Care | Payer: Medicare PPO | Attending: Emergency Medicine | Admitting: Emergency Medicine

## 2022-02-05 ENCOUNTER — Encounter (HOSPITAL_BASED_OUTPATIENT_CLINIC_OR_DEPARTMENT_OTHER): Payer: Self-pay

## 2022-02-05 ENCOUNTER — Other Ambulatory Visit (HOSPITAL_BASED_OUTPATIENT_CLINIC_OR_DEPARTMENT_OTHER): Payer: Self-pay

## 2022-02-05 DIAGNOSIS — M79672 Pain in left foot: Secondary | ICD-10-CM | POA: Insufficient documentation

## 2022-02-05 DIAGNOSIS — Z7982 Long term (current) use of aspirin: Secondary | ICD-10-CM | POA: Diagnosis not present

## 2022-02-05 DIAGNOSIS — G20 Parkinson's disease: Secondary | ICD-10-CM | POA: Insufficient documentation

## 2022-02-05 DIAGNOSIS — Z8551 Personal history of malignant neoplasm of bladder: Secondary | ICD-10-CM | POA: Diagnosis not present

## 2022-02-05 MED ORDER — PREDNISONE 10 MG PO TABS
20.0000 mg | ORAL_TABLET | Freq: Every day | ORAL | 0 refills | Status: AC
  Filled 2022-02-05: qty 10, 5d supply, fill #0

## 2022-02-05 NOTE — Discharge Instructions (Addendum)
Follow-up with podiatry.  Recommend 1000 mg of Tylenol every 6 hours as needed for pain.  Take prednisone for the next several days to help with some inflammation.

## 2022-02-05 NOTE — ED Provider Notes (Signed)
[facility] EMERGENCY DEPT Provider Note   CSN: [id number] Arrival date & time: 02/05/22  4235     History  Chief Complaint  Patient presents with   Foot Pain    Ryan Woodward is a 76 y.o. male.  Patient is here with left foot pain.  Pain on the bottom of his feet worse when he first takes a step in the morning.  Ongoing for the last 3 days.  Denies any injury.  Denies any fevers or chills.  Does not have any ankle pain or calf pain or any upper leg pain.  No recent travel or surgery.  No history of blood clots.  History of bladder cancer, Parkinson's disease.  Nothing makes it better.  Walking makes it worse.  No history of gout or arthritis.  The history is provided by the patient.       Home Medications Prior to Admission medications   Medication Sig Start Date End Date Taking? Authorizing Provider  predniSONE (DELTASONE) 10 MG tablet Take 2 tablets (20 mg total) by mouth daily for 5 days. 02/05/22 02/10/22 Yes Adella Manolis, DO  aspirin EC 81 MG tablet Take 81 mg by mouth daily.    [provider]  carbidopa-levodopa (SINEMET IR) 25-100 MG tablet TAKE 1 TAB 3 XDAILY TAKE 4-5HRS APART FOR EXAMPLE 8AM 12 & 4PM OR SIMILAR DO NOT EAT PROTEIN 30 MIM 01/28/22   Melvenia Beam, MD  clonazePAM [place]bye [place]) 0.5 MG tablet At bedtime prn for REM BD 11/12/21   Dohmeier, Asencion Partridge, MD  donepezil (ARICEPT) 10 MG tablet Take 1 tablet (10 mg total) by mouth at bedtime. 08/10/21   Melvenia Beam, MD  fluticasone (FLONASE) 50 MCG/ACT nasal spray Place 2 sprays into the nose as needed.    [provider]  Multiple Vitamins-Minerals (MULTIVITAMIN PO) Take by mouth as needed.    [provider]  QUEtiapine (SEROQUEL) 25 MG tablet Take 1 tablet (25 mg total) by mouth 2 (two) times daily. 08/10/21   Melvenia Beam, MD  sildenafil (REVATIO) 20 MG tablet Take 20 mg by mouth 3 (three) times daily as needed.    [provider]      Allergies    Patient has no  known allergies.    Review of Systems   Review of Systems  Physical Exam Updated Vital Signs BP (!) 152/64 (BP Location: Right Arm)   Pulse 98   Temp 98 F (36.7 C) (Oral)   Resp 20   Ht '6\' 4"'$  (1.93 m)   Wt 79.4 kg   SpO2 100%   BMI 21.30 kg/m  Physical Exam Vitals and nursing note reviewed.  Constitutional:      General: He is not in acute distress.    Appearance: He is well-developed.  HENT:     Head: Normocephalic and atraumatic.  Eyes:     Conjunctiva/sclera: Conjunctivae normal.  Cardiovascular:     Rate and Rhythm: Normal rate and regular rhythm.     Pulses: Normal pulses.     Heart sounds: No murmur heard. Pulmonary:     Effort: Pulmonary effort is normal. No respiratory distress.     Breath sounds: Normal breath sounds.  Abdominal:     Palpations: Abdomen is soft.     Tenderness: There is no abdominal tenderness.  Musculoskeletal:        General: Tenderness present. No swelling.     Cervical back: Neck supple.     Comments: Tender in the left  plantar fascia area of the left foot  Skin:    General: Skin is warm and dry.     Capillary Refill: Capillary refill takes less than 2 seconds.  Neurological:     General: No focal deficit present.     Mental Status: He is alert.     Sensory: No sensory deficit.     Motor: No weakness.  Psychiatric:        Mood and Affect: Mood normal.     ED Results / Procedures / Treatments   Labs (all labs ordered are listed, but only abnormal results are displayed) Labs Reviewed - No data to display  EKG None  Radiology DG Foot Complete Left  Result Date: 02/05/2022 CLINICAL DATA:  Pain, "pain in bottom of left foot gradually gotten worse where I cannot bear weight on it" EXAM: LEFT FOOT - COMPLETE 3 VIEW COMPARISON:  None Available. FINDINGS: There is no evidence of fracture or dislocation. There is 1.2 x 0.5 cm accessory bone seen immediately lateral to the cuboid bone likely an os peroneum. There is no evidence of  arthropathy or other focal bone abnormality. Soft tissues are unremarkable. IMPRESSION: No fracture or dislocation.  Os peroneum. Electronically Signed   By: Frazier Richards M.D.   On: 02/05/2022 09:54    Procedures Procedures    Medications Ordered in ED Medications - No data to display  ED Course/ Medical Decision Making/ A&P                           Medical Decision Making Amount and/or Complexity of Data Reviewed Radiology: ordered.  Risk Prescription drug management.   [name] is here with left foot pain.  Pain in the plantar fascia area.  Differential diagnosis is plantar fasciitis, arthritis, bone spur.  Low suspicion for fracture.  No traumatic history.  Neurovascular neuromuscular intact.  Normal vitals.  Pain for the last 3 days.  X-ray of the left foot per my review and interpretation shows no fracture or dislocation.  Radiology reports there is a 1.2 x .5 cm accessory bone seen immediately lateral to the cuboid bone.  Suspect that this is likely causing some inflammation and pain.  We will have him follow-up with podiatry.  Put in a walking boot.  Will prescribe prednisone for short course to help with discomfort.  Discharged in good condition.  This chart was dictated using voice recognition software.  Despite best efforts to proofread,  errors can occur which can change the documentation meaning.         Final Clinical Impression(s) / ED Diagnoses Final diagnoses:  Foot pain, left    Rx / DC Orders ED Discharge Orders          Ordered    predniSONE (DELTASONE) 10 MG tablet  Daily        02/05/22 0959              Lennice Sites, DO 02/05/22 1002

## 2022-02-05 NOTE — ED Triage Notes (Signed)
"  Pain in bottom of left foot, gradually gotten worse to where I can not bear weight on it" per pt. Denies injury.

## 2022-02-08 ENCOUNTER — Encounter: Payer: Self-pay | Admitting: Adult Health

## 2022-02-08 ENCOUNTER — Ambulatory Visit: Payer: Medicare PPO | Admitting: Adult Health

## 2022-02-08 VITALS — BP 140/70 | HR 97

## 2022-02-08 DIAGNOSIS — R413 Other amnesia: Secondary | ICD-10-CM | POA: Diagnosis not present

## 2022-02-08 DIAGNOSIS — G20 Parkinson's disease: Secondary | ICD-10-CM | POA: Diagnosis not present

## 2022-02-08 DIAGNOSIS — R443 Hallucinations, unspecified: Secondary | ICD-10-CM

## 2022-02-08 NOTE — Patient Instructions (Addendum)
Your Plan:  ContinueSinemet 25-100 mg TID Continue Aricept 10 mg daily  Continue Seroquel 25 mg daily at bedtime Continue Klonopin 0.5 mg at bedtime PRN   Can consider starting Nuplazid for hallucinations   Thank you for coming to see [place] at [facility]. I hope we have been able to provide you high quality care today.  You may receive a patient satisfaction survey over the next few weeks. We would appreciate your feedback and comments so that we may continue to improve ourselves and the health of our patients.

## 2022-02-08 NOTE — Progress Notes (Signed)
PATIENT: Ryan Woodward DOB: Jul 16, 1946  REASON FOR VISIT: follow up HISTORY FROM: patient PRIMARY NEUROLOGIST:   HISTORY OF PRESENT ILLNESS: Today 02/08/22:  Ryan Woodward is a 76 year old male with a history of parkinson's disease. He returns today for follow-up. Continues on Sinemet 25-100 mg TID.  Wearing a boot on the left foot. Woke up Friday morning and couldn't put foot on the floor. Went to ER- x-ray showed severe plantar fasciitis and bone spur. Was placed on prednisone.  Seeing improvement  Completed PT after last visit with Ryan Woodward and felt that it went well.   Tremor: Both hands R>L   Sleep: Sleeping well, partner reports that he moves a lot in his sleep- acting out dreams. Continues Klonopin at bedtime.   Last visit started on seroquel 25 mg BID- partner reports that he is still seeing people. Doesn't feel t hallucinations are fearful.  Memory: feels that its worse. Trouble with numbers- day of the week, phone numbers etc. Able to complete all ADLs independently. Doesn't typically need prompting. Continues on Aricept 10 mg at bedtime     HISTORY (copied from Ryan Woodward note) 08/10/2021. Motor symptoms progressing, Partner here. BP ws elevated today 180s-190s see Dr. Harrington Woodward but asymptomatic. He feels stable. Quite a few hallucinations. He is convinced there are 2 of his partner. They are disturbing. He sees people at the house. His qtc is nml 402, discussed black box warnings seqoquel. He is doing well on the carbidopa levodopa, cont current dose. Some stumbles. No falls. Discussed PT. No depression, still walking and exercising, he stays cold all the time, no weight loss. Compliant on sleep apnea. Discussed fall risk, swallowing(let us know if any choking), can use botox if drooling, skin checks, PT, reviewed exercise and encouraged. Increase donepezil, no side effects to 27m, pulse stable 51, elevated BP see Dr. RHarrington Woodward Will continue to follow clinically/    Interval history January 26, 2021: Patient is here for follow-up on Parkinson's disease.  I last saw him in April 2020 however he seen Ryan Givens NP multiple times since then.  He is on clonazepam for REM sleep disorder.  He recently met again for follow-up of neurocognitive testing, progression in his Parkinson's disease, per Ryan Woodward he appears to be responding well to medications to manage his tremors, patient's older brother has Parkinson's as well and the motor tremors the patient's brothers are more severe, there is worsening cognitive function with regard to memory as well as clear progression of motor function in the past several years.  As compared to 2020, current comparisons do show further progression in number of significant cognitive and motor function domains, loss and fine motor control, loss in grip strength bilaterally, loss and global cognitive functioning with generally preserved verbal skills albeit significantly below his predicted levels based on his educational and occupational history.  Also showing significant loss with regard to visual spatial skills, information processing speed and overall speed of mental operations.  Overall decrease in memory and learning capacity.   Does not appear depressed, has good support from his family, it was recommended he stop cooking independently and do not drive.   Here with his partner. Patient says he has reached a plateau. For the most part he is stable. No falls. Walking every day, they go early 1.5 miles a day. His appetite is good. He feels a vitamin gets stuck in his mouth but if broken in half he swallowing, occ will swallow wrong not  often. Ryan Woodward says everything is fine. He has had 4 shots. No significant weight loss. He has been having hallucinations, 4 instances he was talking to someone they were not there, he looks away and looks bad and they are gone. He has seen his Ryan Woodward. Not disturbing, he did see his Ryan Woodward who was not there. He is convinced there are  2 of his Ryan Woodward at the house. No delusions. No frustration or agitation. He has constipation and takes miralax (discuss with pcp). Stable on the clonazepam for REM sleep disorder.Tremor is stable, physcially he is stable.He feels he has lotsof salive in his mouth but no drooling. No depression.     REVIEW OF SYSTEMS: Out of a complete 14 system review of symptoms, the patient complains only of the following symptoms, and all other reviewed systems are negative.  ALLERGIES: No Known Allergies  HOME MEDICATIONS: Outpatient Medications Prior to Visit  Medication Sig Dispense Refill   aspirin EC 81 MG tablet Take 81 mg by mouth daily.     carbidopa-levodopa (SINEMET IR) 25-100 MG tablet TAKE 1 TAB 3 XDAILY TAKE 4-5HRS APART FOR EXAMPLE 8AM 12 & 4PM OR SIMILAR DO NOT EAT PROTEIN 30 MIM 270 tablet 0   clonazePAM (KLONOPIN) 0.5 MG tablet At bedtime prn for REM BD 30 tablet 2   donepezil (ARICEPT) 10 MG tablet Take 1 tablet (10 mg total) by mouth at bedtime. 90 tablet 4   fluticasone (FLONASE) 50 MCG/ACT nasal spray Place 2 sprays into the nose as needed.     Multiple Vitamins-Minerals (MULTIVITAMIN PO) Take by mouth as needed.     predniSONE (DELTASONE) 10 MG tablet Take 2 tablets (20 mg total) by mouth daily for 5 days. 10 tablet 0   QUEtiapine (SEROQUEL) 25 MG tablet Take 1 tablet (25 mg total) by mouth 2 (two) times daily. 60 tablet 11   sildenafil (REVATIO) 20 MG tablet Take 20 mg by mouth 3 (three) times daily as needed.     No facility-administered medications prior to visit.    PAST MEDICAL HISTORY: Past Medical History:  Diagnosis Date   A-fib [facility])    isolated episode during colonoscopy   Allergies    Back pain    Bladder cancer ([place]) dx aug'12   s/p turbt   Enlarged prostate with urinary obstruction    nocturia/ freq/ dysuria   GERD (gastroesophageal reflux disease)    OSA on CPAP    wears CPAP nightly   PD (Parkinson's disease) ([place])    Skin cancer of nose 05/2018   basal  cell    PAST SURGICAL HISTORY: Past Surgical History:  Procedure Laterality Date   CERVICAL FUSION  2000   C4-5   COLONOSCOPY     CYSTOSCOPY  06/16/2011   Procedure: CYSTOSCOPY;  Surgeon: Molli Hazard, MD;  [facility];  Service: Urology;  Laterality: N/A;   CYSTOSCOPY WITH URETHRAL DILATATION  w/ TURBT BX   05-04-11   INGUINAL HERNIA REPAIR Bilateral 02/07/2019   Procedure: BILATERAL INGUINAL HERNIA REPAIR WITH MESH;  Surgeon: Donnie Mesa, MD;  Location: [place];  Service: General;  Laterality: Bilateral;  GENERAL AND TAP BLOCK ANESTHESIA   INSERTION OF MESH Bilateral 02/07/2019   Procedure: INSERTION OF MESH;  Surgeon: Donnie Mesa, MD;  Location: [place];  Service: General;  Laterality: Bilateral;   SINUS SURGERY     TRANSURETHRAL RESECTION OF PROSTATE  06/16/2011   Procedure: TRANSURETHRAL RESECTION OF THE PROSTATE (TURP);  Surgeon: Molli Hazard, MD;  Location: [facility]  Service: Urology;  Laterality: N/A;  URETHRAL DILATATION, MEATOTOMY, PENIL BLOCK    FAMILY HISTORY: Family History  Problem Relation Age of Onset   Alzheimer's disease Mother    Stroke Mother    Alzheimer's disease Maternal Uncle    Parkinson's disease Neg Hx     SOCIAL HISTORY: Social History   Socioeconomic History   Marital status: Soil scientist    Spouse name: Not on file   Number of children: 0   Years of education: Not on file   Highest education level: Master's degree (e.g., MA, MS, MEng, MEd, MSW, MBA)  Occupational History   Occupation: Band teacher-Retired  Tobacco Use   Smoking status: Former    Types: Cigarettes    Quit date: 09/1985    Years since quitting: 36.4   Smokeless tobacco: Never  Vaping Use   Vaping Use: Never used  Substance and Sexual Activity   Alcohol use: Yes    Alcohol/week: 14.0 standard drinks of alcohol    Types: 14 Cans of beer per week    Comment: 2 beers  daily   Drug use: Never   Sexual activity: Not on file  Other Topics Concern   Not on file  Social History Narrative   Lives at home with partner   Right handed   Caffeine: 2 cups of coffee each morning    Social Determinants of Health   Financial Resource Strain: Not on file  Food Insecurity: Not on file  Transportation Needs: Not on file  Physical Activity: Not on file  Stress: Not on file  Social Connections: Not on file  Intimate Partner Violence: Not on file      PHYSICAL EXAM  Vitals:   02/08/22 1309  BP: 140/70  Pulse: 97   There is no height or weight on file to calculate BMI.     02/08/2022    1:10 PM 07/21/2020   11:20 AM 05/23/2018    1:36 PM  MMSE - Mini Mental State Exam  Orientation to time _0 Orientation to Place _1 Registration _2 Attention/ Calculation _3 Recall 0 1 3  Language- name 2 objects _4 Language- repeat _5 Language- follow 3 step command _6 Language- read & follow direction _7 Write a sentence _8 Copy design 0 1 1  Total score _9 Generalized: Well developed, in no acute distress   Neurological examination  Mentation: Alert oriented to time, place, history taking. Follows all commands  Dysphonia Cranial nerve II-XII: Pupils were equal round reactive to light. Extraocular movements were full, visual field were full on confrontational test. Facial sensation and strength were normal.  Head turning and shoulder shrug  were normal and symmetric. Motor: The motor testing reveals 5 over 5 strength of all 4 extremities.  Boot on left foot.  Moderate impairment of finger taps and rapid alternating movement in the upper extremities.  Moderate impairment of toe taps bilaterally Sensory: Sensory testing is intact to soft touch on all 4 extremities. No evidence of extinction is noted.  Coordination: Cerebellar testing reveals good finger-nose-finger and heel-to-shin bilaterally.  Gait and station:  Patient using a walker.  Has boot on left foot   DIAGNOSTIC DATA (LABS, IMAGING, TESTING) - I reviewed patient records, labs, notes, testing  and imaging myself where available.  Lab Results  Component Value Date   WBC 6.5 08/10/2021   HGB 15.7 08/10/2021   HCT 44.6 08/10/2021   MCV 90 08/10/2021   PLT 185 08/10/2021      Component Value Date/Time   NA 143 08/10/2021 1230   K 4.5 08/10/2021 1230   CL 105 08/10/2021 1230   CO2 26 08/10/2021 1230   GLUCOSE 98 08/10/2021 1230   GLUCOSE 127 (H) 06/16/2011 1430   BUN 16 08/10/2021 1230   CREATININE 0.94 08/10/2021 1230   CALCIUM 9.7 08/10/2021 1230   PROT 6.3 08/10/2021 1230   ALBUMIN 4.7 08/10/2021 1230   AST 18 08/10/2021 1230   ALT 5 08/10/2021 1230   ALKPHOS 51 08/10/2021 1230   BILITOT 1.0 08/10/2021 1230   GFRNONAA 83 05/23/2018 1453   GFRAA 96 05/23/2018 1453   No results found for: "CHOL", "HDL", "LDLCALC", "LDLDIRECT", "TRIG", "CHOLHDL" No results found for: "HGBA1C" No results found for: "VITAMINB12" Lab Results  Component Value Date   TSH 2.100 08/10/2021      ASSESSMENT AND PLAN 76 y.o. year old male  has a past medical history of A-fib ([place]), Allergies, Back pain, Bladder cancer ([place]) (dx aug'12), Enlarged prostate with urinary obstruction, GERD (gastroesophageal reflux disease), OSA on CPAP, PD (Parkinson's disease) ([place]), and Skin cancer of nose (05/2018). here with:  Parkinson's Disease Hallucinations Memory disturbance  Continue Sinemet 25-100 mg TID Continue Aricept 10 mg daily  Continue Seroquel 25 mg BID Continue Klonopin 0.5 mg at bedtime PRN  Discussed switching to Nuplazid he will review medication and let me know if he wants to try this.   Ryan Givens, MSN, NP-C 02/08/2022, 12:54 PM [facility] [address] [place], [place] [address] [phone]

## 2022-02-11 ENCOUNTER — Emergency Department (HOSPITAL_BASED_OUTPATIENT_CLINIC_OR_DEPARTMENT_OTHER)
Admission: EM | Admit: 2022-02-11 | Discharge: 2022-02-11 | Disposition: A | Discharge status: Home / Self Care | Payer: Medicare PPO | Source: Home / Self Care | Attending: Emergency Medicine | Admitting: Emergency Medicine

## 2022-02-11 ENCOUNTER — Other Ambulatory Visit: Payer: Self-pay

## 2022-02-11 ENCOUNTER — Encounter (HOSPITAL_BASED_OUTPATIENT_CLINIC_OR_DEPARTMENT_OTHER): Payer: Self-pay | Admitting: *Deleted

## 2022-02-11 DIAGNOSIS — Z87891 Personal history of nicotine dependence: Secondary | ICD-10-CM | POA: Diagnosis not present

## 2022-02-11 DIAGNOSIS — R339 Retention of urine, unspecified: Secondary | ICD-10-CM | POA: Diagnosis not present

## 2022-02-11 DIAGNOSIS — J9811 Atelectasis: Secondary | ICD-10-CM | POA: Diagnosis not present

## 2022-02-11 DIAGNOSIS — J852 Abscess of lung without pneumonia: Secondary | ICD-10-CM | POA: Diagnosis not present

## 2022-02-11 DIAGNOSIS — E876 Hypokalemia: Secondary | ICD-10-CM | POA: Diagnosis present

## 2022-02-11 DIAGNOSIS — R3916 Straining to void: Secondary | ICD-10-CM | POA: Diagnosis not present

## 2022-02-11 DIAGNOSIS — R338 Other retention of urine: Secondary | ICD-10-CM | POA: Insufficient documentation

## 2022-02-11 DIAGNOSIS — R197 Diarrhea, unspecified: Secondary | ICD-10-CM | POA: Diagnosis not present

## 2022-02-11 DIAGNOSIS — A4151 Sepsis due to Escherichia coli [E. coli]: Secondary | ICD-10-CM | POA: Diagnosis not present

## 2022-02-11 DIAGNOSIS — Z85828 Personal history of other malignant neoplasm of skin: Secondary | ICD-10-CM | POA: Diagnosis not present

## 2022-02-11 DIAGNOSIS — T83511A Infection and inflammatory reaction due to indwelling urethral catheter, initial encounter: Secondary | ICD-10-CM | POA: Diagnosis not present

## 2022-02-11 DIAGNOSIS — N400 Enlarged prostate without lower urinary tract symptoms: Secondary | ICD-10-CM | POA: Diagnosis not present

## 2022-02-11 DIAGNOSIS — J851 Abscess of lung with pneumonia: Secondary | ICD-10-CM | POA: Diagnosis not present

## 2022-02-11 DIAGNOSIS — Z79899 Other long term (current) drug therapy: Secondary | ICD-10-CM | POA: Diagnosis not present

## 2022-02-11 DIAGNOSIS — Z981 Arthrodesis status: Secondary | ICD-10-CM | POA: Diagnosis not present

## 2022-02-11 DIAGNOSIS — I10 Essential (primary) hypertension: Secondary | ICD-10-CM | POA: Diagnosis not present

## 2022-02-11 DIAGNOSIS — Z781 Physical restraint status: Secondary | ICD-10-CM | POA: Diagnosis not present

## 2022-02-11 DIAGNOSIS — N401 Enlarged prostate with lower urinary tract symptoms: Secondary | ICD-10-CM | POA: Diagnosis not present

## 2022-02-11 DIAGNOSIS — R9431 Abnormal electrocardiogram [ECG] [EKG]: Secondary | ICD-10-CM | POA: Diagnosis not present

## 2022-02-11 DIAGNOSIS — R509 Fever, unspecified: Secondary | ICD-10-CM | POA: Diagnosis not present

## 2022-02-11 DIAGNOSIS — J189 Pneumonia, unspecified organism: Secondary | ICD-10-CM | POA: Diagnosis not present

## 2022-02-11 DIAGNOSIS — I480 Paroxysmal atrial fibrillation: Secondary | ICD-10-CM | POA: Diagnosis not present

## 2022-02-11 DIAGNOSIS — Z820 Family history of epilepsy and other diseases of the nervous system: Secondary | ICD-10-CM | POA: Diagnosis not present

## 2022-02-11 DIAGNOSIS — Z20822 Contact with and (suspected) exposure to covid-19: Secondary | ICD-10-CM | POA: Diagnosis present

## 2022-02-11 DIAGNOSIS — N390 Urinary tract infection, site not specified: Secondary | ICD-10-CM | POA: Diagnosis not present

## 2022-02-11 DIAGNOSIS — Y92239 Unspecified place in hospital as the place of occurrence of the external cause: Secondary | ICD-10-CM | POA: Diagnosis not present

## 2022-02-11 DIAGNOSIS — G4733 Obstructive sleep apnea (adult) (pediatric): Secondary | ICD-10-CM | POA: Diagnosis present

## 2022-02-11 DIAGNOSIS — R531 Weakness: Secondary | ICD-10-CM | POA: Diagnosis not present

## 2022-02-11 DIAGNOSIS — Z8551 Personal history of malignant neoplasm of bladder: Secondary | ICD-10-CM | POA: Insufficient documentation

## 2022-02-11 DIAGNOSIS — I77810 Thoracic aortic ectasia: Secondary | ICD-10-CM | POA: Diagnosis not present

## 2022-02-11 DIAGNOSIS — R000 Tachycardia, unspecified: Secondary | ICD-10-CM | POA: Diagnosis not present

## 2022-02-11 DIAGNOSIS — F05 Delirium due to known physiological condition: Secondary | ICD-10-CM | POA: Diagnosis present

## 2022-02-11 DIAGNOSIS — N3000 Acute cystitis without hematuria: Secondary | ICD-10-CM | POA: Diagnosis not present

## 2022-02-11 DIAGNOSIS — R6510 Systemic inflammatory response syndrome (SIRS) of non-infectious origin without acute organ dysfunction: Secondary | ICD-10-CM | POA: Diagnosis present

## 2022-02-11 DIAGNOSIS — G20 Parkinson's disease: Secondary | ICD-10-CM | POA: Diagnosis not present

## 2022-02-11 DIAGNOSIS — K6289 Other specified diseases of anus and rectum: Secondary | ICD-10-CM | POA: Diagnosis not present

## 2022-02-11 DIAGNOSIS — K219 Gastro-esophageal reflux disease without esophagitis: Secondary | ICD-10-CM | POA: Diagnosis present

## 2022-02-11 DIAGNOSIS — F02C11 Dementia in other diseases classified elsewhere, severe, with agitation: Secondary | ICD-10-CM | POA: Diagnosis not present

## 2022-02-11 DIAGNOSIS — Z66 Do not resuscitate: Secondary | ICD-10-CM | POA: Diagnosis not present

## 2022-02-11 DIAGNOSIS — Y846 Urinary catheterization as the cause of abnormal reaction of the patient, or of later complication, without mention of misadventure at the time of the procedure: Secondary | ICD-10-CM | POA: Diagnosis present

## 2022-02-11 DIAGNOSIS — Z9620 Presence of otological and audiological implant, unspecified: Secondary | ICD-10-CM | POA: Diagnosis present

## 2022-02-11 DIAGNOSIS — K5909 Other constipation: Secondary | ICD-10-CM | POA: Diagnosis present

## 2022-02-11 DIAGNOSIS — Z515 Encounter for palliative care: Secondary | ICD-10-CM | POA: Diagnosis not present

## 2022-02-11 DIAGNOSIS — N3289 Other specified disorders of bladder: Secondary | ICD-10-CM | POA: Diagnosis not present

## 2022-02-11 DIAGNOSIS — A419 Sepsis, unspecified organism: Secondary | ICD-10-CM | POA: Diagnosis present

## 2022-02-11 DIAGNOSIS — I2510 Atherosclerotic heart disease of native coronary artery without angina pectoris: Secondary | ICD-10-CM | POA: Diagnosis not present

## 2022-02-11 DIAGNOSIS — I700 Atherosclerosis of aorta: Secondary | ICD-10-CM | POA: Diagnosis not present

## 2022-02-11 DIAGNOSIS — W19XXXA Unspecified fall, initial encounter: Secondary | ICD-10-CM | POA: Diagnosis not present

## 2022-02-11 DIAGNOSIS — Z7189 Other specified counseling: Secondary | ICD-10-CM | POA: Diagnosis not present

## 2022-02-11 LAB — URINALYSIS, ROUTINE W REFLEX MICROSCOPIC
Bilirubin Urine: NEGATIVE
Glucose, UA: NEGATIVE mg/dL
Hgb urine dipstick: NEGATIVE
Ketones, ur: NEGATIVE mg/dL
Leukocytes,Ua: NEGATIVE
Nitrite: NEGATIVE
Protein, ur: NEGATIVE mg/dL
Specific Gravity, Urine: 1.013 (ref 1.005–1.030)
pH: 5.5 (ref 5.0–8.0)

## 2022-02-11 NOTE — ED Triage Notes (Signed)
Pt c/o constipation a couple days ago. States he as also taken a stool softener as well. C/o lower abd pain. Has not taking anything for pain. Describes as a dull ache with sharp pain at times. Denies any n/v. Denies any fevers. Pt not able to sit still due to pain. States he has had small amounts of stool since enemas.

## 2022-02-11 NOTE — ED Provider Notes (Signed)
[facility] Provider Note: Georgena Spurling, MD, FACEP  CSN: [id number] MRN: [id number] ARRIVAL: 02/11/22 at [place]: [id number]   CHIEF COMPLAINT  Abdominal Pain   HISTORY OF PRESENT ILLNESS  02/11/22 6:03 AM Ryan Woodward is a 76 y.o. male who has had several days of constipation.  This has been associated with lower abdominal pain.  He describes the pain as a dull ache with intermittent sharp pains.  He rates it as a 6 out of 10.  He has not had a fever with this.  He has not had nausea or vomiting with this.  He has taken a stool softener and used enemas with the passage of a small amount of stool.  He has been having difficulty urinating as well.   Past Medical History:  Diagnosis Date   A-fib [facility])    isolated episode during colonoscopy   Allergies    Back pain    Bladder cancer ([place]) dx aug'12   s/p turbt   Enlarged prostate with urinary obstruction    nocturia/ freq/ dysuria   GERD (gastroesophageal reflux disease)    OSA on CPAP    wears CPAP nightly   PD (Parkinson's disease) ([place])    Skin cancer of nose 05/2018   basal cell    Past Surgical History:  Procedure Laterality Date   CERVICAL FUSION  2000   C4-5   COLONOSCOPY     CYSTOSCOPY  06/16/2011   Procedure: CYSTOSCOPY;  Surgeon: Molli Hazard, MD;  Location: [facility];  Service: Urology;  Laterality: N/A;   CYSTOSCOPY WITH URETHRAL DILATATION  w/ TURBT BX   05-04-11   INGUINAL HERNIA REPAIR Bilateral 02/07/2019   Procedure: BILATERAL INGUINAL HERNIA REPAIR WITH MESH;  Surgeon: Donnie Mesa, MD;  Location: [place];  Service: General;  Laterality: Bilateral;  GENERAL AND TAP BLOCK ANESTHESIA   INSERTION OF MESH Bilateral 02/07/2019   Procedure: INSERTION OF MESH;  Surgeon: Donnie Mesa, MD;  Location: [place];  Service: General;  Laterality: Bilateral;   SINUS SURGERY     TRANSURETHRAL RESECTION OF PROSTATE  06/16/2011   Procedure:  TRANSURETHRAL RESECTION OF THE PROSTATE (TURP);  Surgeon: Molli Hazard, MD;  Location[facility];  Service: Urology;  Laterality: N/A;  URETHRAL DILATATION, MEATOTOMY, PENIL BLOCK    Family History  Problem Relation Age of Onset   Alzheimer's disease Mother    Stroke Mother    Alzheimer's disease Maternal Uncle    Parkinson's disease Neg Hx     Social History   Tobacco Use   Smoking status: Former    Types: Cigarettes    Quit date: 09/1985    Years since quitting: 36.4   Smokeless tobacco: Never  Vaping Use   Vaping Use: Never used  Substance Use Topics   Alcohol use: Yes    Alcohol/week: 14.0 standard drinks of alcohol    Types: 14 Cans of beer per week    Comment: 2 beers daily   Drug use: Never    Prior to Admission medications   Medication Sig Start Date End Date Taking? Authorizing Provider  carbidopa-levodopa (SINEMET IR) 25-100 MG tablet TAKE 1 TAB 3 XDAILY TAKE 4-5HRS APART FOR EXAMPLE 8AM 12 & 4PM OR SIMILAR DO NOT EAT PROTEIN 30 MIM 01/28/22   Melvenia Beam, MD  clonazePAM Bobbye [place]) 0.5 MG tablet At bedtime prn for REM BD 11/12/21   Dohmeier, Asencion Partridge, MD  donepezil (ARICEPT) 10 MG  tablet Take 1 tablet (10 mg total) by mouth at bedtime. 08/10/21   Melvenia Beam, MD  fluticasone (FLONASE) 50 MCG/ACT nasal spray Place 2 sprays into the nose as needed.    [provider]  Multiple Vitamins-Minerals (MULTIVITAMIN PO) Take by mouth as needed.    [provider]  QUEtiapine (SEROQUEL) 25 MG tablet Take 1 tablet (25 mg total) by mouth 2 (two) times daily. 08/10/21   Melvenia Beam, MD  sildenafil (REVATIO) 20 MG tablet Take 20 mg by mouth 3 (three) times daily as needed.    [provider]    Allergies Patient has no known allergies.   REVIEW OF SYSTEMS  Negative except as noted here or in the History of Present Illness.   PHYSICAL EXAMINATION  Initial Vital Signs Blood pressure 100/70, pulse 98, temperature  98.7 F (37.1 C), resp. rate 18, height '6\' 4"'$  (1.93 m), weight 79.4 kg, SpO2 98 %.  Examination General: Well-developed, well-nourished male in no acute distress; appearance consistent with age of record HENT: normocephalic; atraumatic Eyes: Normal appearance Neck: supple Heart: regular rate and rhythm Lungs: clear to auscultation bilaterally Abdomen: soft; distended, mildly tender bladder; bowel sounds present Extremities: No deformity; full range of motion; pulses normal Neurologic: Awake, alert; noted to move all extremities Skin: Warm and dry Psychiatric: Flat affect   RESULTS  Summary of this visit's results, reviewed and interpreted by myself:   EKG Interpretation  Date/Time:    Ventricular Rate:    PR Interval:    QRS Duration:   QT Interval:    QTC Calculation:   R Axis:     Text Interpretation:         Laboratory Studies: Results for orders placed or performed during the hospital encounter of 02/11/22 (from the past 24 hour(s))  Urinalysis, Routine w reflex microscopic Urine, Catheterized     Status: None   Collection Time: 02/11/22  6:14 AM  Result Value Ref Range   Color, Urine YELLOW YELLOW   APPearance CLEAR CLEAR   Specific Gravity, Urine 1.013 1.005 - 1.030   pH 5.5 5.0 - 8.0   Glucose, UA NEGATIVE NEGATIVE mg/dL   Hgb urine dipstick NEGATIVE NEGATIVE   Bilirubin Urine NEGATIVE NEGATIVE   Ketones, ur NEGATIVE NEGATIVE mg/dL   Protein, ur NEGATIVE NEGATIVE mg/dL   Nitrite NEGATIVE NEGATIVE   Leukocytes,Ua NEGATIVE NEGATIVE   Imaging Studies: No results found.  ED COURSE and MDM  Nursing notes, initial and subsequent vitals signs, including pulse oximetry, reviewed and interpreted by myself.  Vitals:   02/11/22 0534 02/11/22 0540  BP:  100/70  Pulse:  98  Resp:  18  Temp:  98.7 F (37.1 C)  SpO2:  98%  Weight: 79.4 kg   Height: '6\' 4"'$  (1.93 m)    Medications - No data to display  6:15 AM Bedside bladder scan shows greater than 1 L of  retained urine in the bladder.  We will have nursing staff place Foley catheter.   6:28 AM Foley catheter placed without difficulty.  Catheter draining amber urine, about 1 L in collection bag at this time.  Specimen sent for urinalysis.  We will have patient follow-up with his urologist, Dr. Louis Meckel.    PROCEDURES  Procedures   ED DIAGNOSES     ICD-10-CM   1. Acute urinary retention  R33.8          Shanon Rosser, MD 02/11/22 [phone]

## 2022-02-11 NOTE — ED Notes (Signed)
Bladder scan showing greater than 971m // MD aware and orders placed for foley cath

## 2022-02-14 ENCOUNTER — Inpatient Hospital Stay (HOSPITAL_COMMUNITY): Payer: Medicare PPO

## 2022-02-14 ENCOUNTER — Other Ambulatory Visit: Payer: Self-pay

## 2022-02-14 ENCOUNTER — Emergency Department (HOSPITAL_COMMUNITY): Payer: Medicare PPO

## 2022-02-14 ENCOUNTER — Encounter (HOSPITAL_COMMUNITY): Payer: Self-pay

## 2022-02-14 ENCOUNTER — Inpatient Hospital Stay (HOSPITAL_COMMUNITY)
Admission: EM | Admit: 2022-02-14 | Discharge: 2022-02-22 | DRG: 698 | Disposition: A | Discharge status: Hospice | Payer: Medicare PPO | Attending: Student | Admitting: Student

## 2022-02-14 DIAGNOSIS — Z781 Physical restraint status: Secondary | ICD-10-CM

## 2022-02-14 DIAGNOSIS — Z66 Do not resuscitate: Secondary | ICD-10-CM | POA: Diagnosis present

## 2022-02-14 DIAGNOSIS — J189 Pneumonia, unspecified organism: Secondary | ICD-10-CM | POA: Diagnosis not present

## 2022-02-14 DIAGNOSIS — T83511A Infection and inflammatory reaction due to indwelling urethral catheter, initial encounter: Secondary | ICD-10-CM | POA: Diagnosis present

## 2022-02-14 DIAGNOSIS — A419 Sepsis, unspecified organism: Secondary | ICD-10-CM | POA: Diagnosis present

## 2022-02-14 DIAGNOSIS — J851 Abscess of lung with pneumonia: Secondary | ICD-10-CM | POA: Diagnosis present

## 2022-02-14 DIAGNOSIS — Y846 Urinary catheterization as the cause of abnormal reaction of the patient, or of later complication, without mention of misadventure at the time of the procedure: Secondary | ICD-10-CM | POA: Diagnosis present

## 2022-02-14 DIAGNOSIS — Z8551 Personal history of malignant neoplasm of bladder: Secondary | ICD-10-CM

## 2022-02-14 DIAGNOSIS — R3916 Straining to void: Secondary | ICD-10-CM | POA: Diagnosis not present

## 2022-02-14 DIAGNOSIS — Z7189 Other specified counseling: Secondary | ICD-10-CM | POA: Diagnosis not present

## 2022-02-14 DIAGNOSIS — Z820 Family history of epilepsy and other diseases of the nervous system: Secondary | ICD-10-CM

## 2022-02-14 DIAGNOSIS — R918 Other nonspecific abnormal finding of lung field: Secondary | ICD-10-CM

## 2022-02-14 DIAGNOSIS — W19XXXA Unspecified fall, initial encounter: Secondary | ICD-10-CM

## 2022-02-14 DIAGNOSIS — Z79899 Other long term (current) drug therapy: Secondary | ICD-10-CM

## 2022-02-14 DIAGNOSIS — I480 Paroxysmal atrial fibrillation: Secondary | ICD-10-CM | POA: Diagnosis present

## 2022-02-14 DIAGNOSIS — Z515 Encounter for palliative care: Secondary | ICD-10-CM

## 2022-02-14 DIAGNOSIS — K219 Gastro-esophageal reflux disease without esophagitis: Secondary | ICD-10-CM | POA: Diagnosis present

## 2022-02-14 DIAGNOSIS — R6510 Systemic inflammatory response syndrome (SIRS) of non-infectious origin without acute organ dysfunction: Secondary | ICD-10-CM | POA: Insufficient documentation

## 2022-02-14 DIAGNOSIS — A4151 Sepsis due to Escherichia coli [E. coli]: Secondary | ICD-10-CM | POA: Diagnosis not present

## 2022-02-14 DIAGNOSIS — J852 Abscess of lung without pneumonia: Secondary | ICD-10-CM

## 2022-02-14 DIAGNOSIS — N390 Urinary tract infection, site not specified: Secondary | ICD-10-CM | POA: Diagnosis present

## 2022-02-14 DIAGNOSIS — N400 Enlarged prostate without lower urinary tract symptoms: Secondary | ICD-10-CM | POA: Diagnosis present

## 2022-02-14 DIAGNOSIS — Z9620 Presence of otological and audiological implant, unspecified: Secondary | ICD-10-CM | POA: Diagnosis present

## 2022-02-14 DIAGNOSIS — G4733 Obstructive sleep apnea (adult) (pediatric): Secondary | ICD-10-CM | POA: Diagnosis present

## 2022-02-14 DIAGNOSIS — K5909 Other constipation: Secondary | ICD-10-CM | POA: Diagnosis present

## 2022-02-14 DIAGNOSIS — G20 Parkinson's disease: Secondary | ICD-10-CM | POA: Diagnosis present

## 2022-02-14 DIAGNOSIS — F05 Delirium due to known physiological condition: Secondary | ICD-10-CM | POA: Diagnosis present

## 2022-02-14 DIAGNOSIS — Z20822 Contact with and (suspected) exposure to covid-19: Secondary | ICD-10-CM | POA: Diagnosis present

## 2022-02-14 DIAGNOSIS — Z981 Arthrodesis status: Secondary | ICD-10-CM | POA: Diagnosis not present

## 2022-02-14 DIAGNOSIS — N3000 Acute cystitis without hematuria: Secondary | ICD-10-CM | POA: Diagnosis not present

## 2022-02-14 DIAGNOSIS — E876 Hypokalemia: Secondary | ICD-10-CM | POA: Diagnosis present

## 2022-02-14 DIAGNOSIS — F02C11 Dementia in other diseases classified elsewhere, severe, with agitation: Secondary | ICD-10-CM | POA: Diagnosis present

## 2022-02-14 DIAGNOSIS — Y92239 Unspecified place in hospital as the place of occurrence of the external cause: Secondary | ICD-10-CM | POA: Diagnosis not present

## 2022-02-14 DIAGNOSIS — N401 Enlarged prostate with lower urinary tract symptoms: Secondary | ICD-10-CM | POA: Diagnosis not present

## 2022-02-14 DIAGNOSIS — B9620 Unspecified Escherichia coli [E. coli] as the cause of diseases classified elsewhere: Secondary | ICD-10-CM | POA: Diagnosis present

## 2022-02-14 DIAGNOSIS — Z87891 Personal history of nicotine dependence: Secondary | ICD-10-CM | POA: Diagnosis not present

## 2022-02-14 DIAGNOSIS — Z85828 Personal history of other malignant neoplasm of skin: Secondary | ICD-10-CM | POA: Diagnosis not present

## 2022-02-14 LAB — CBC WITH DIFFERENTIAL/PLATELET
Abs Immature Granulocytes: 0.19 10*3/uL — ABNORMAL HIGH (ref 0.00–0.07)
Basophils Absolute: 0 10*3/uL (ref 0.0–0.1)
Basophils Relative: 0 %
Eosinophils Absolute: 0.1 10*3/uL (ref 0.0–0.5)
Eosinophils Relative: 0 %
HCT: 39.8 % (ref 39.0–52.0)
Hemoglobin: 13.4 g/dL (ref 13.0–17.0)
Immature Granulocytes: 1 %
Lymphocytes Relative: 4 %
Lymphs Abs: 0.7 10*3/uL (ref 0.7–4.0)
MCH: 30.6 pg (ref 26.0–34.0)
MCHC: 33.7 g/dL (ref 30.0–36.0)
MCV: 90.9 fL (ref 80.0–100.0)
Monocytes Absolute: 1.5 10*3/uL — ABNORMAL HIGH (ref 0.1–1.0)
Monocytes Relative: 8 %
Neutro Abs: 15.3 10*3/uL — ABNORMAL HIGH (ref 1.7–7.7)
Neutrophils Relative %: 87 %
Platelets: 203 10*3/uL (ref 150–400)
RBC: 4.38 MIL/uL (ref 4.22–5.81)
RDW: 12.3 % (ref 11.5–15.5)
WBC: 17.8 10*3/uL — ABNORMAL HIGH (ref 4.0–10.5)
nRBC: 0 % (ref 0.0–0.2)

## 2022-02-14 LAB — URINALYSIS, ROUTINE W REFLEX MICROSCOPIC
Bilirubin Urine: NEGATIVE
Glucose, UA: NEGATIVE mg/dL
Ketones, ur: 20 mg/dL — AB
Nitrite: POSITIVE — AB
Protein, ur: 100 mg/dL — AB
RBC / HPF: >50 RBC/hpf — ABNORMAL HIGH (ref 0–5)
Specific Gravity, Urine: 1.021 (ref 1.005–1.030)
WBC, UA: >50 WBC/hpf — ABNORMAL HIGH (ref 0–5)
pH: 6 (ref 5.0–8.0)

## 2022-02-14 LAB — RESP PANEL BY RT-PCR (FLU A&B, COVID) ARPGX2
Influenza A by PCR: NEGATIVE
Influenza B by PCR: NEGATIVE
SARS Coronavirus 2 by RT PCR: NEGATIVE

## 2022-02-14 LAB — COMPREHENSIVE METABOLIC PANEL
ALT: 25 U/L (ref 0–44)
AST: 24 U/L (ref 15–41)
Albumin: 3.3 g/dL — ABNORMAL LOW (ref 3.5–5.0)
Alkaline Phosphatase: 53 U/L (ref 38–126)
Anion gap: 11 (ref 5–15)
BUN: 22 mg/dL (ref 8–23)
CO2: 24 mmol/L (ref 22–32)
Calcium: 8.6 mg/dL — ABNORMAL LOW (ref 8.9–10.3)
Chloride: 107 mmol/L (ref 98–111)
Creatinine, Ser: 0.85 mg/dL (ref 0.61–1.24)
GFR, Estimated: >60 mL/min (ref >60)
Glucose, Bld: 115 mg/dL — ABNORMAL HIGH (ref 70–99)
Potassium: 3.8 mmol/L (ref 3.5–5.1)
Sodium: 142 mmol/L (ref 135–145)
Total Bilirubin: 0.7 mg/dL (ref 0.3–1.2)
Total Protein: 6.6 g/dL (ref 6.5–8.1)

## 2022-02-14 LAB — CBC
HCT: 35.7 % — ABNORMAL LOW (ref 39.0–52.0)
Hemoglobin: 12.1 g/dL — ABNORMAL LOW (ref 13.0–17.0)
MCH: 30.3 pg (ref 26.0–34.0)
MCHC: 33.9 g/dL (ref 30.0–36.0)
MCV: 89.5 fL (ref 80.0–100.0)
Platelets: 208 10*3/uL (ref 150–400)
RBC: 3.99 MIL/uL — ABNORMAL LOW (ref 4.22–5.81)
RDW: 12.2 % (ref 11.5–15.5)
WBC: 18.3 10*3/uL — ABNORMAL HIGH (ref 4.0–10.5)
nRBC: 0 % (ref 0.0–0.2)

## 2022-02-14 LAB — APTT: aPTT: 29 seconds (ref 24–36)

## 2022-02-14 LAB — LACTIC ACID, PLASMA
Lactic Acid, Venous: 1.4 mmol/L (ref 0.5–1.9)
Lactic Acid, Venous: 0.8 mmol/L (ref 0.5–1.9)

## 2022-02-14 LAB — PROTIME-INR
INR: 1.1 (ref 0.8–1.2)
Prothrombin Time: 14.1 seconds (ref 11.4–15.2)

## 2022-02-14 LAB — CREATININE, SERUM
Creatinine, Ser: 0.81 mg/dL (ref 0.61–1.24)
GFR, Estimated: >60 mL/min (ref >60)

## 2022-02-14 MED ORDER — DONEPEZIL HCL 10 MG PO TABS
10.0000 mg | ORAL_TABLET | Freq: Every day | ORAL | Status: DC
  Administered 2022-02-14 – 2022-02-20 (×7): 10 mg via ORAL
  Filled 2022-02-14 (×4): qty 1
  Filled 2022-02-14: qty 2
  Filled 2022-02-14 (×2): qty 1

## 2022-02-14 MED ORDER — FLUTICASONE PROPIONATE 50 MCG/ACT NA SUSP: 2.0000 | Freq: Every day | NASAL | Status: DC | PRN

## 2022-02-14 MED ORDER — IOHEXOL 350 MG/ML SOLN
150.0000 mL | Freq: Once | INTRAVENOUS | Status: AC | PRN
  Administered 2022-02-14: 150 mL via INTRAVENOUS

## 2022-02-14 MED ORDER — SODIUM CHLORIDE 0.9 % IV SOLN
2.0000 g | Freq: Once | INTRAVENOUS | Status: AC
  Administered 2022-02-14: 2 g via INTRAVENOUS
  Filled 2022-02-14: qty 12.5

## 2022-02-14 MED ORDER — LACTATED RINGERS IV SOLN
INTRAVENOUS | Status: DC
Start: 2022-02-14 — End: 2022-02-14

## 2022-02-14 MED ORDER — CLONAZEPAM 0.5 MG PO TABS
0.5000 mg | ORAL_TABLET | Freq: Every day | ORAL | Status: DC
  Administered 2022-02-14 – 2022-02-20 (×7): 0.5 mg via ORAL
  Filled 2022-02-14 (×7): qty 1

## 2022-02-14 MED ORDER — SODIUM CHLORIDE 0.9 % IV SOLN
500.0000 mg | INTRAVENOUS | Status: DC
  Administered 2022-02-14: 500 mg via INTRAVENOUS
  Filled 2022-02-14 (×2): qty 5

## 2022-02-14 MED ORDER — SODIUM CHLORIDE (PF) 0.9 % IJ SOLN
INTRAMUSCULAR | Status: AC
  Administered 2022-02-14: 10 mL
  Filled 2022-02-14: qty 50

## 2022-02-14 MED ORDER — LACTATED RINGERS IV BOLUS (SEPSIS)
1000.0000 mL | Freq: Once | INTRAVENOUS | Status: AC
Start: 2022-02-14 — End: 2022-02-14
  Administered 2022-02-14: 1000 mL via INTRAVENOUS

## 2022-02-14 MED ORDER — LACTATED RINGERS IV BOLUS
500.0000 mL | Freq: Once | INTRAVENOUS | Status: AC
  Administered 2022-02-14: 500 mL via INTRAVENOUS

## 2022-02-14 MED ORDER — ACETAMINOPHEN 650 MG RE SUPP: 650.0000 mg | Freq: Four times a day (QID) | RECTAL | Status: DC | PRN

## 2022-02-14 MED ORDER — LACTATED RINGERS IV SOLN: INTRAVENOUS | Status: AC

## 2022-02-14 MED ORDER — SODIUM CHLORIDE 0.9 % IV SOLN
2.0000 g | INTRAVENOUS | Status: DC
  Administered 2022-02-14: 2 g via INTRAVENOUS
  Filled 2022-02-14: qty 20

## 2022-02-14 MED ORDER — ACETAMINOPHEN 325 MG PO TABS
650.0000 mg | ORAL_TABLET | Freq: Once | ORAL | Status: AC
  Administered 2022-02-14: 650 mg via ORAL
  Filled 2022-02-14: qty 2

## 2022-02-14 MED ORDER — ENOXAPARIN SODIUM 40 MG/0.4ML IJ SOSY
40.0000 mg | PREFILLED_SYRINGE | INTRAMUSCULAR | Status: DC
  Administered 2022-02-14 – 2022-02-20 (×7): 40 mg via SUBCUTANEOUS
  Filled 2022-02-14 (×7): qty 0.4

## 2022-02-14 MED ORDER — QUETIAPINE FUMARATE 25 MG PO TABS
25.0000 mg | ORAL_TABLET | Freq: Two times a day (BID) | ORAL | Status: DC
  Administered 2022-02-14 – 2022-02-18 (×8): 25 mg via ORAL
  Filled 2022-02-14 (×8): qty 1

## 2022-02-14 MED ORDER — CARBIDOPA-LEVODOPA 25-100 MG PO TABS
1.0000 | ORAL_TABLET | ORAL | Status: DC
  Administered 2022-02-14 – 2022-02-20 (×17): 1 via ORAL
  Filled 2022-02-14 (×19): qty 1

## 2022-02-14 MED ORDER — ACETAMINOPHEN 325 MG PO TABS
650.0000 mg | ORAL_TABLET | Freq: Four times a day (QID) | ORAL | Status: DC | PRN
  Administered 2022-02-18: 650 mg via ORAL
  Filled 2022-02-14: qty 2

## 2022-02-14 NOTE — Sepsis Progress Note (Signed)
Elink following code sepsis °

## 2022-02-14 NOTE — ED Provider Notes (Addendum)
[facility] DEPT Provider Note   CSN: [id number] Arrival date & time: 02/14/22  1639     History  Chief Complaint  Patient presents with   Weakness   Diarrhea    Ryan Woodward is a 76 y.o. male.  Patient here with weakness.  History of Parkinson's.  Has had a Foley catheter in place for several days due to urinary retention.  Has had some diarrhea.  No recent antibiotics.  Has a history of A-fib on blood thinners.  No recent falls.  Denies any cough, sputum production.  No abdominal pain.  Does not know if he had a fever.  Nothing makes it worse or better.  Weakness for the last 2 days.  The history is provided by the patient.       Home Medications Prior to Admission medications   Medication Sig Start Date End Date Taking? Authorizing Provider  carbidopa-levodopa (SINEMET IR) 25-100 MG tablet TAKE 1 TAB 3 XDAILY TAKE 4-5HRS APART FOR EXAMPLE 8AM 12 & 4PM OR SIMILAR DO NOT EAT PROTEIN 30 MIM 01/28/22   Melvenia Beam, MD  clonazePAM [facility] [place]) 0.5 MG tablet At bedtime prn for REM BD 11/12/21   Dohmeier, Asencion Partridge, MD  donepezil (ARICEPT) 10 MG tablet Take 1 tablet (10 mg total) by mouth at bedtime. 08/10/21   Melvenia Beam, MD  fluticasone (FLONASE) 50 MCG/ACT nasal spray Place 2 sprays into the nose as needed.    [provider]  Multiple Vitamins-Minerals (MULTIVITAMIN PO) Take by mouth as needed.    [provider]  QUEtiapine (SEROQUEL) 25 MG tablet Take 1 tablet (25 mg total) by mouth 2 (two) times daily. 08/10/21   Melvenia Beam, MD  sildenafil (REVATIO) 20 MG tablet Take 20 mg by mouth 3 (three) times daily as needed.    [provider]      Allergies    Patient has no known allergies.    Review of Systems   Review of Systems  Physical Exam Updated Vital Signs BP (!) 149/56   Pulse 88   Temp (!) 102.3 F (39.1 C) (Oral)   Resp (!) 24   Ht '6\' 4"'$  (1.93 m)   Wt 78 kg   SpO2 97%   BMI 20.94 kg/m   Physical Exam Vitals and nursing note reviewed.  Constitutional:      General: He is not in acute distress.    Appearance: He is well-developed. He is not ill-appearing.  HENT:     Head: Normocephalic and atraumatic.     Nose: Nose normal.     Mouth/Throat:     Mouth: Mucous membranes are dry.  Eyes:     Extraocular Movements: Extraocular movements intact.     Conjunctiva/sclera: Conjunctivae normal.     Pupils: Pupils are equal, round, and reactive to light.  Cardiovascular:     Rate and Rhythm: Normal rate and regular rhythm.     Pulses: Normal pulses.     Heart sounds: Normal heart sounds. No murmur heard. Pulmonary:     Effort: Pulmonary effort is normal. No respiratory distress.     Breath sounds: Normal breath sounds.  Abdominal:     Palpations: Abdomen is soft.     Tenderness: There is no abdominal tenderness.  Musculoskeletal:        General: No swelling.     Cervical back: Neck supple.  Skin:    General: Skin is warm and dry.     Capillary Refill:  Capillary refill takes less than 2 seconds.  Neurological:     General: No focal deficit present.     Mental Status: He is alert.  Psychiatric:        Mood and Affect: Mood normal.     ED Results / Procedures / Treatments   Labs (all labs ordered are listed, but only abnormal results are displayed) Labs Reviewed  COMPREHENSIVE METABOLIC PANEL - Abnormal; Notable for the following components:      Result Value   Glucose, Bld 115 (*)    Calcium 8.6 (*)    Albumin 3.3 (*)    All other components within normal limits  CBC WITH DIFFERENTIAL/PLATELET - Abnormal; Notable for the following components:   WBC 17.8 (*)    Neutro Abs 15.3 (*)    Monocytes Absolute 1.5 (*)    Abs Immature Granulocytes 0.19 (*)    All other components within normal limits  URINALYSIS, ROUTINE W REFLEX MICROSCOPIC - Abnormal; Notable for the following components:   APPearance HAZY (*)    Hgb urine dipstick MODERATE (*)    Ketones, ur 20  (*)    Protein, ur 100 (*)    Nitrite POSITIVE (*)    Leukocytes,Ua LARGE (*)    RBC / HPF >50 (*)    WBC, UA >50 (*)    Bacteria, UA MANY (*)    All other components within normal limits  RESP PANEL BY RT-PCR (FLU A&B, COVID) [id number]  CULTURE, BLOOD (ROUTINE X 2)  CULTURE, BLOOD (ROUTINE X 2)  URINE CULTURE  C DIFFICILE QUICK SCREEN W PCR REFLEX    GASTROINTESTINAL PANEL BY PCR, STOOL (REPLACES STOOL CULTURE)  LACTIC ACID, PLASMA  PROTIME-INR  APTT  LACTIC ACID, PLASMA    EKG EKG Interpretation  Date/Time:  Sunday February 14 2022 16:51:33 EDT Ventricular Rate:  95 PR Interval:  135 QRS Duration: 102 QT Interval:  325 QTC Calculation: 409 R Axis:   92 Text Interpretation: Sinus rhythm Right axis deviation Confirmed by Lennice Sites ([id number]) on 02/14/2022 5:14:17 PM  Radiology DG Chest Port 1 View  Result Date: 02/14/2022 CLINICAL DATA:  Sepsis, weakness, diarrhea EXAM: PORTABLE CHEST 1 VIEW COMPARISON:  None Available. FINDINGS: Single frontal view of the chest demonstrates an unremarkable cardiac silhouette. Peripheral wedge-shaped consolidation within the right mid chest could reflect bronchopneumonia. No effusion or pneumothorax. No acute bony abnormalities. IMPRESSION: 1. Peripheral wedge-shaped consolidation within the lateral right mid chest, compatible with bronchopneumonia in the appropriate setting. Followup PA and lateral chest X-ray is recommended in 3-4 weeks following trial of antibiotic therapy to ensure resolution and exclude underlying malignancy. Electronically Signed   By: Randa Ngo M.D.   On: 02/14/2022 17:26    Procedures .Critical Care  Performed by: Lennice Sites, DO Authorized by: Lennice Sites, DO   Critical care provider statement:    Critical care time (minutes):  35   Critical care was necessary to treat or prevent imminent or life-threatening deterioration of the following conditions:  Sepsis   Critical care was time spent personally by me on  the following activities:  Blood draw for specimens, development of treatment plan with patient or surrogate, discussions with primary provider, evaluation of patient's response to treatment, examination of patient, obtaining history from patient or surrogate, ordering and performing treatments and interventions, ordering and review of laboratory studies, ordering and review of radiographic studies, pulse oximetry and re-evaluation of patient's condition   Care discussed with: admitting provider  Medications Ordered in ED Medications  lactated ringers infusion ( Intravenous New Bag/Given 02/14/22 1813)  lactated ringers bolus 1,000 mL (1,000 mLs Intravenous New Bag/Given 02/14/22 1812)  ceFEPIme (MAXIPIME) 2 g in sodium chloride 0.9 % 100 mL IVPB (2 g Intravenous New Bag/Given 02/14/22 1814)  acetaminophen (TYLENOL) tablet 650 mg (650 mg Oral Given 02/14/22 1746)    ED Course/ Medical Decision Making/ A&P                           Medical Decision Making Amount and/or Complexity of Data Reviewed Labs: ordered. Radiology: ordered. ECG/medicine tests: ordered.  Risk OTC drugs. Prescription drug management. Decision regarding hospitalization.   Ryan Woodward is here with weakness.  History of A-fib on blood thinner, Parkinson's, recent urinary retention with Foley catheter in place who presents to the ED with weakness.  Found to have a fever.  Heart rate in the 90s.  Code sepsis activated.  Suspect urinary source.  We will start IV antibiotics, blood cultures, lactic acid, CBC.  We will get chest x-ray.  No falls and no need for head imaging at this time.  Patient with some loose stools as well.  We will send off C. difficile and stool pathogen panel.  Abdominal exam is benign.  Anticipate admission.  Will give IV fluids.  Per my review and interpretation of labs, patient with a white count of 17.  Lactic acid normal.  Urinalysis consistent with infection.  Chest x-ray questionable for  infection.  Does not seem like he had much respiratory symptoms.  Overall suspect source is from urinary process.  Lab work otherwise unremarkable.  Will admit for further care.  This chart was dictated using voice recognition software.  Despite best efforts to proofread,  errors can occur which can change the documentation meaning.         Final Clinical Impression(s) / ED Diagnoses Final diagnoses:  Sepsis, due to unspecified organism, unspecified whether acute organ dysfunction present [facility])  Community acquired pneumonia, unspecified laterality  Urinary tract infection associated with indwelling urethral catheter, initial encounter [facility])    Rx / [place] Orders ED Discharge Orders     None         Lennice Sites, DO 02/14/22 [place], [place], DO 02/14/22 1918

## 2022-02-14 NOTE — ED Notes (Signed)
Bladder Scan = 777ms p using urinal

## 2022-02-14 NOTE — Progress Notes (Signed)
A consult was received from an ED physician for cefepime per pharmacy dosing.  The patient's profile has been reviewed for ht/wt/allergies/indication/available labs.    A one time order has been placed for cefepime 2 g IV once.  Further antibiotics/pharmacy consults should be ordered by admitting physician if indicated.                       Thank you, Lenis Noon, PharmD 02/14/2022  5:08 PM

## 2022-02-14 NOTE — ED Triage Notes (Signed)
Pt coming from home via EMS with c/o increased weakness and diarrhea x3 days. Pt had foley catheter placed 3 days ago at [facility]. Hx parkinson's, dementia, and afib.

## 2022-02-14 NOTE — H&P (Signed)
History and Physical    Ryan Woodward:[id number] DOB: [birth year] DOA: 02/14/2022  PCP: Lawerance Cruel, MD  Patient coming from: Home.  Chief Complaint: Weakness diarrhea.  History obtained from patient's spouse.  HPI: Ryan Woodward is a 76 y.o. male with history of Parkinson's, dementia, sleep disorder had come to the ER 3 days ago for urinary retention at the time had Foley catheter placed and discharged home.  As per patient's spouse over the last 3 days patient has become more weak and also has been in multiple episodes of watery diarrhea no vomiting but did complain of some abdominal discomfort.  Patient has not had any chest pain or cough.  Per patient's spouse patient has not had any sick contacts or any recent travel.  Has not been on any antibiotics.  ED Course: In the ER patient was febrile with temperature of 102.3 F tachycardic with heart rate in the 102's tachypneic with labs showing leukocytosis UA is consistent with UTI and chest x-ray shows which is hazy defect concerning for pneumonia.  Blood cultures were sent and started on empiric antibiotics admitted for sepsis likely from UTI and possible pneumonia.  On exam patient appears confused and is oriented to his name only.  As per patient's spouse patient has been getting more confused recently.  Review of Systems: As per HPI, rest all negative.   Past Medical History:  Diagnosis Date   A-fib [facility])    isolated episode during colonoscopy   Allergies    Back pain    Bladder cancer ([place]) dx aug'12   s/p turbt   Enlarged prostate with urinary obstruction    nocturia/ freq/ dysuria   GERD (gastroesophageal reflux disease)    OSA on CPAP    wears CPAP nightly   PD (Parkinson's disease) ([place])    Skin cancer of nose 05/2018   basal cell    Past Surgical History:  Procedure Laterality Date   CERVICAL FUSION  2000   C4-5   COLONOSCOPY     CYSTOSCOPY  06/16/2011   Procedure: CYSTOSCOPY;  Surgeon: Molli Hazard, MD;  [facility];  Service: Urology;  Laterality: N/A;   CYSTOSCOPY WITH URETHRAL DILATATION  w/ TURBT BX   05-04-11   INGUINAL HERNIA REPAIR Bilateral 02/07/2019   Procedure: BILATERAL INGUINAL HERNIA REPAIR WITH MESH;  Surgeon: Donnie Mesa, MD;  Location: [facility];  Service: General;  Laterality: Bilateral;  GENERAL AND TAP BLOCK ANESTHESIA   INSERTION OF MESH Bilateral 02/07/2019   Procedure: INSERTION OF MESH;  Surgeon: Donnie Mesa, MD;  Location: [facility];  Service: General;  Laterality: Bilateral;   SINUS SURGERY     TRANSURETHRAL RESECTION OF PROSTATE  06/16/2011   Procedure: TRANSURETHRAL RESECTION OF THE PROSTATE (TURP);  Surgeon: Molli Hazard, MD;  Location: [facility];  Service: Urology;  Laterality: N/A;  URETHRAL DILATATION, MEATOTOMY, PENIL BLOCK     reports that he quit smoking about 36 years ago. His smoking use included cigarettes. He has never used smokeless tobacco. He reports current alcohol use of about 14.0 standard drinks of alcohol per week. He reports that he does not use drugs.  No Known Allergies  Family History  Problem Relation Age of Onset   Alzheimer's disease Mother    Stroke Mother    Alzheimer's disease Maternal Uncle    Parkinson's disease Neg Hx     Prior to Admission medications   Medication Sig  Start Date End Date Taking? Authorizing Provider  acetaminophen (TYLENOL) 325 MG tablet Take 325-650 mg by mouth every 6 (six) hours as needed for mild pain or headache.   Yes [provider]  carbidopa-levodopa (SINEMET IR) 25-100 MG tablet TAKE 1 TAB 3 XDAILY TAKE 4-5HRS APART FOR EXAMPLE 8AM 12 & 4PM OR SIMILAR DO NOT EAT PROTEIN 30 MIM Patient taking differently: Take 1 tablet by mouth See admin instructions. Take 1 tablet by mouth at 8:00-8:30 AM, 12 NOON, and 6 PM daily 01/28/22  Yes Melvenia Beam, MD  clonazePAM (KLONOPIN) 0.5 MG tablet At bedtime  prn for REM BD Patient taking differently: Take 0.5 mg by mouth at bedtime. 11/12/21  Yes Dohmeier, Asencion Partridge, MD  donepezil (ARICEPT) 10 MG tablet Take 1 tablet (10 mg total) by mouth at bedtime. 08/10/21  Yes Melvenia Beam, MD  fluticasone (FLONASE) 50 MCG/ACT nasal spray Place 2 sprays into both nostrils daily as needed for allergies or rhinitis.   Yes [provider]  Multiple Vitamins-Minerals (MULTIVITAMIN PO) Take 1 tablet by mouth daily with breakfast.   Yes [provider]  QUEtiapine (SEROQUEL) 25 MG tablet Take 1 tablet (25 mg total) by mouth 2 (two) times daily. Patient taking differently: Take 25 mg by mouth in the morning and at bedtime. 08/10/21  Yes Melvenia Beam, MD  sildenafil (REVATIO) 20 MG tablet Take 20 mg by mouth 3 (three) times daily as needed.    [provider]    Physical Exam: Constitutional: Moderately built and nourished. Vitals:   02/14/22 1818 02/14/22 1900 02/14/22 1930 02/14/22 1944  BP: (!) 149/56 (!) 136/57 (!) 148/73   Pulse: 88 (!) 42 (!) 37   Resp: (!) 24 (!) 21 (!) 22   Temp:    99 F (37.2 C)  TempSrc:    Oral  SpO2: 97% 93% 95%   Weight:      Height:       Eyes: Anicteric no pallor. ENMT: No discharge from the ears eyes nose and mouth. Neck: No mass felt.  No neck rigidity. Respiratory: No rhonchi or crepitations. Cardiovascular: S1-S2 heard. Abdomen: Soft nontender bowel sound present. Musculoskeletal: No edema. Skin: No rash. Neurologic: Alert awake oriented to his name only moving all extremities. Psychiatric: Oriented to his name only.   Labs on Admission: I have personally reviewed following labs and imaging studies  CBC: Recent Labs  Lab 02/14/22 1701  WBC 17.8*  NEUTROABS 15.3*  HGB 13.4  HCT 39.8  MCV 90.9  PLT 294   Basic Metabolic Panel: Recent Labs  Lab 02/14/22 1701  NA 142  K 3.8  CL 107  CO2 24  GLUCOSE 115*  BUN 22  CREATININE 0.85  CALCIUM 8.6*   GFR: Estimated Creatinine  Clearance: 82.8 mL/min (by C-G formula based on SCr of 0.85 mg/dL). Liver Function Tests: Recent Labs  Lab 02/14/22 1701  AST 24  ALT 25  ALKPHOS 53  BILITOT 0.7  PROT 6.6  ALBUMIN 3.3*   No results for input(s): "LIPASE", "AMYLASE" in the last 168 hours. No results for input(s): "AMMONIA" in the last 168 hours. Coagulation Profile: Recent Labs  Lab 02/14/22 1701  INR 1.1   Cardiac Enzymes: No results for input(s): "CKTOTAL", "CKMB", "CKMBINDEX", "TROPONINI" in the last 168 hours. BNP (last 3 results) No results for input(s): "PROBNP" in the last 8760 hours. HbA1C: No results for input(s): "HGBA1C" in the last 72 hours. CBG: No results for input(s): "GLUCAP" in the  last 168 hours. Lipid Profile: No results for input(s): "CHOL", "HDL", "LDLCALC", "TRIG", "CHOLHDL", "LDLDIRECT" in the last 72 hours. Thyroid Function Tests: No results for input(s): "TSH", "T4TOTAL", "FREET4", "T3FREE", "THYROIDAB" in the last 72 hours. Anemia Panel: No results for input(s): "VITAMINB12", "FOLATE", "FERRITIN", "TIBC", "IRON", "RETICCTPCT" in the last 72 hours. Urine analysis:    Component Value Date/Time   COLORURINE YELLOW 02/14/2022 1817   APPEARANCEUR HAZY (A) 02/14/2022 1817   LABSPEC 1.021 02/14/2022 1817   PHURINE 6.0 02/14/2022 1817   GLUCOSEU NEGATIVE 02/14/2022 1817   HGBUR MODERATE (A) 02/14/2022 1817   BILIRUBINUR NEGATIVE 02/14/2022 1817   KETONESUR 20 (A) 02/14/2022 1817   PROTEINUR 100 (A) 02/14/2022 1817   NITRITE POSITIVE (A) 02/14/2022 1817   LEUKOCYTESUR LARGE (A) 02/14/2022 1817   Sepsis Labs: '@LABRCNTIP'$ (procalcitonin:4,lacticidven:4) ) Recent Results (from the past 240 hour(s))  Resp Panel by RT-PCR (Flu A&B, Covid) Anterior Nasal Swab     Status: None   Collection Time: 02/14/22  7:22 PM   Specimen: Anterior Nasal Swab  Result Value Ref Range Status   SARS Coronavirus 2 by RT PCR NEGATIVE NEGATIVE Final    Comment: (NOTE) SARS-CoV-2 target nucleic acids are  NOT DETECTED.  The SARS-CoV-2 RNA is generally detectable in upper respiratory specimens during the acute phase of infection. The lowest concentration of SARS-CoV-2 viral copies this assay can detect is 138 copies/mL. A negative result does not preclude SARS-Cov-2 infection and should not be used as the sole basis for treatment or other patient management decisions. A negative result may occur with  improper specimen collection/handling, submission of specimen other than nasopharyngeal swab, presence of viral mutation(s) within the areas targeted by this assay, and inadequate number of viral copies(<138 copies/mL). A negative result must be combined with clinical observations, patient history, and epidemiological information. The expected result is Negative.  Fact Sheet for Patients:  EntrepreneurPulse.com.au  Fact Sheet for Healthcare Providers:  IncredibleEmployment.be  This test is no t yet approved or cleared by the [place] FDA and  has been authorized for detection and/or diagnosis of SARS-CoV-2 by FDA under an Emergency Use Authorization (EUA). This EUA will remain  in effect (meaning this test can be used) for the duration of the COVID-19 declaration under Section 564(b)(1) of the Act, 21 U.S.C.section 360bbb-3(b)(1), unless the authorization is terminated  or revoked sooner.       Influenza A by PCR NEGATIVE NEGATIVE Final   Influenza B by PCR NEGATIVE NEGATIVE Final    Comment: (NOTE) The Xpert Xpress SARS-CoV-2/FLU/RSV plus assay is intended as an aid in the diagnosis of influenza from Nasopharyngeal swab specimens and should not be used as a sole basis for treatment. Nasal washings and aspirates are unacceptable for Xpert Xpress SARS-CoV-2/FLU/RSV testing.  Fact Sheet for Patients: EntrepreneurPulse.com.au  Fact Sheet for Healthcare Providers: IncredibleEmployment.be  This test is not  yet approved or cleared by the [place] FDA and has been authorized for detection and/or diagnosis of SARS-CoV-2 by FDA under an Emergency Use Authorization (EUA). This EUA will remain in effect (meaning this test can be used) for the duration of the COVID-19 declaration under Section 564(b)(1) of the Act, 21 U.S.C. section 360bbb-3(b)(1), unless the authorization is terminated or revoked.  Performed at [facility], [place] [address] [place], [place] [address]      Radiological Exams on Admission: DG Chest Port 1 View  Result Date: 02/14/2022 CLINICAL DATA:  Sepsis, weakness, diarrhea EXAM: PORTABLE CHEST 1 VIEW COMPARISON:  None Available. FINDINGS: Single frontal view of the chest demonstrates an unremarkable cardiac silhouette. Peripheral wedge-shaped consolidation within the right mid chest could reflect bronchopneumonia. No effusion or pneumothorax. No acute bony abnormalities. IMPRESSION: 1. Peripheral wedge-shaped consolidation within the lateral right mid chest, compatible with bronchopneumonia in the appropriate setting. Followup PA and lateral chest X-ray is recommended in 3-4 weeks following trial of antibiotic therapy to ensure resolution and exclude underlying malignancy. Electronically Signed   By: Randa Ngo M.D.   On: 02/14/2022 17:26    EKG: Independently reviewed.  Normal sinus rhythm.  Assessment/Plan Principal Problem:   Sepsis ([place]) Active Problems:   Parkinson's disease ([place])   Obstructive sleep apnea   UTI (urinary tract infection)   CAP (community acquired pneumonia)    Sepsis likely from UTI and possible pneumonia for which patient is placed on empiric antibiotics we will continue with fluids follow cultures.  Since patient is also having diarrhea we will check stool studies.  Since patient is not able to give a good history and had noted some abdominal pain and also chest x-ray showing wedge-shaped defect we will get CT scan of the  chest to rule out PE and also CT abdomen pelvis.  Foley catheter has been ordered to be removed. Urinary retention with Foley catheter in place 3 days ago which has been requested to be removed.  Will check bladder scan every few hours and if patient does have urine retention will put in a new Foley catheter. History of Parkinson disease on Sinemet. History of dementia with worsening mental status on Aricept and also takes Seroquel.  Worsening mental status could be also acute from the sepsis.  Appears nonfocal at this time. REM sleep disorder on Klonopin. History of sleep apnea. History of paroxysmal atrial fibrillation presently in sinus rhythm not on any anticoagulation.  Presently rate controlled.  Since patient has sepsis will need close monitoring for any further worsening and inpatient status.   DVT prophylaxis: Lovenox. Code Status: DNR confirmed with patient's falls. Family Communication: Patient's spouse. Disposition Plan: Home. Consults called: None. Admission status: Inpatient.   Rise Patience MD [facility] Pager [phone].  If 7PM-7AM, please contact night-coverage [email or web address] Password [facility]  02/14/2022, 8:15 PM

## 2022-02-15 DIAGNOSIS — A419 Sepsis, unspecified organism: Secondary | ICD-10-CM | POA: Diagnosis not present

## 2022-02-15 DIAGNOSIS — R918 Other nonspecific abnormal finding of lung field: Secondary | ICD-10-CM

## 2022-02-15 DIAGNOSIS — Z66 Do not resuscitate: Secondary | ICD-10-CM

## 2022-02-15 DIAGNOSIS — J852 Abscess of lung without pneumonia: Secondary | ICD-10-CM

## 2022-02-15 LAB — CBC WITH DIFFERENTIAL/PLATELET
Abs Immature Granulocytes: 0.24 10*3/uL — ABNORMAL HIGH (ref 0.00–0.07)
Basophils Absolute: 0 10*3/uL (ref 0.0–0.1)
Basophils Relative: 0 %
Eosinophils Absolute: 0 10*3/uL (ref 0.0–0.5)
Eosinophils Relative: 0 %
HCT: 36.4 % — ABNORMAL LOW (ref 39.0–52.0)
Hemoglobin: 12.5 g/dL — ABNORMAL LOW (ref 13.0–17.0)
Immature Granulocytes: 1 %
Lymphocytes Relative: 5 %
Lymphs Abs: 0.9 10*3/uL (ref 0.7–4.0)
MCH: 30.9 pg (ref 26.0–34.0)
MCHC: 34.3 g/dL (ref 30.0–36.0)
MCV: 89.9 fL (ref 80.0–100.0)
Monocytes Absolute: 1.4 10*3/uL — ABNORMAL HIGH (ref 0.1–1.0)
Monocytes Relative: 8 %
Neutro Abs: 14.8 10*3/uL — ABNORMAL HIGH (ref 1.7–7.7)
Neutrophils Relative %: 86 %
Platelets: 199 10*3/uL (ref 150–400)
RBC: 4.05 MIL/uL — ABNORMAL LOW (ref 4.22–5.81)
RDW: 12.2 % (ref 11.5–15.5)
WBC: 17.4 10*3/uL — ABNORMAL HIGH (ref 4.0–10.5)
nRBC: 0 % (ref 0.0–0.2)

## 2022-02-15 LAB — HEPATIC FUNCTION PANEL
ALT: 22 U/L (ref 0–44)
AST: 24 U/L (ref 15–41)
Albumin: 2.6 g/dL — ABNORMAL LOW (ref 3.5–5.0)
Alkaline Phosphatase: 48 U/L (ref 38–126)
Bilirubin, Direct: 0.2 mg/dL (ref 0.0–0.2)
Indirect Bilirubin: 0.7 mg/dL (ref 0.3–0.9)
Total Bilirubin: 0.9 mg/dL (ref 0.3–1.2)
Total Protein: 5.2 g/dL — ABNORMAL LOW (ref 6.5–8.1)

## 2022-02-15 LAB — BASIC METABOLIC PANEL
Anion gap: 7 (ref 5–15)
BUN: 14 mg/dL (ref 8–23)
CO2: 25 mmol/L (ref 22–32)
Calcium: 7.5 mg/dL — ABNORMAL LOW (ref 8.9–10.3)
Chloride: 108 mmol/L (ref 98–111)
Creatinine, Ser: 0.77 mg/dL (ref 0.61–1.24)
GFR, Estimated: >60 mL/min (ref >60)
Glucose, Bld: 100 mg/dL — ABNORMAL HIGH (ref 70–99)
Potassium: 3.1 mmol/L — ABNORMAL LOW (ref 3.5–5.1)
Sodium: 140 mmol/L (ref 135–145)

## 2022-02-15 LAB — HIV ANTIBODY (ROUTINE TESTING W REFLEX): HIV Screen 4th Generation wRfx: NONREACTIVE

## 2022-02-15 MED ORDER — POTASSIUM CHLORIDE CRYS ER 20 MEQ PO TBCR
40.0000 meq | EXTENDED_RELEASE_TABLET | Freq: Once | ORAL | Status: AC
  Administered 2022-02-15: 40 meq via ORAL
  Filled 2022-02-15: qty 2

## 2022-02-15 MED ORDER — CHLORHEXIDINE GLUCONATE CLOTH 2 % EX PADS
6.0000 | MEDICATED_PAD | Freq: Every day | CUTANEOUS | Status: DC
  Administered 2022-02-15 – 2022-02-20 (×6): 6 via TOPICAL

## 2022-02-15 MED ORDER — TAMSULOSIN HCL 0.4 MG PO CAPS
0.4000 mg | ORAL_CAPSULE | Freq: Every day | ORAL | Status: DC
  Administered 2022-02-15 – 2022-02-20 (×4): 0.4 mg via ORAL
  Filled 2022-02-15 (×6): qty 1

## 2022-02-15 MED ORDER — LORAZEPAM 2 MG/ML IJ SOLN
1.0000 mg | Freq: Four times a day (QID) | INTRAMUSCULAR | Status: DC | PRN
  Administered 2022-02-15 – 2022-02-16 (×2): 1 mg via INTRAVENOUS
  Filled 2022-02-15 (×2): qty 1

## 2022-02-15 MED ORDER — SODIUM CHLORIDE 0.9 % IV SOLN
1.0000 g | INTRAVENOUS | Status: DC
  Administered 2022-02-15: 1 g via INTRAVENOUS
  Filled 2022-02-15: qty 10

## 2022-02-15 NOTE — ED Notes (Signed)
Ryan Woodward placed on Ryan for safety

## 2022-02-15 NOTE — Progress Notes (Signed)
PROGRESS NOTE    Ryan Woodward  IZT:[id number] DOB: [birth year] DOA: 02/14/2022 PCP: Lawerance Cruel, MD     Brief Narrative:   From admission h and p Ryan Woodward is a 76 y.o. male with history of Parkinson's, dementia, sleep disorder had come to the ER 3 days ago for urinary retention at the time had Foley catheter placed and discharged home.  As per patient's spouse over the last 3 days patient has become more weak and also has been in multiple episodes of watery diarrhea no vomiting but did complain of some abdominal discomfort.  Patient has not had any chest pain or cough.   Per patient's spouse patient has not had any sick contacts or any recent travel.  Has not been on any antibiotics.   ED Course: In the ER patient was febrile with temperature of 102.3 F tachycardic with heart rate in the 102's tachypneic with labs showing leukocytosis UA is consistent with UTI and chest x-ray shows which is hazy defect concerning for pneumonia.  Blood cultures were sent and started on empiric antibiotics admitted for sepsis likely from UTI and possible pneumonia.  On exam patient appears confused and is oriented to his name only.  As per patient's spouse patient has been getting more confused recently.   Assessment & Plan:   Principal Problem:   Sepsis ([place]) Active Problems:   Benign prostate hyperplasia   Parkinson's disease ([place])   Paroxysmal atrial fibrillation ([facility])   Obstructive sleep apnea   UTI (urinary tract infection)   CAP (community acquired pneumonia)   Sepsis likely from UTI for which patient is placed on empiric antibiotics we will continue with fluids follow cultures.  Since patient is also having diarrhea we will check stool studies.  continuing ceftriaxone Urinary retention with Foley catheter in place 3 days ago. The likely source of infection. Foley was discontinued 7/16 but patient still retaining so foley was replaced earlier this morning.  start flomax. Patient has a  hx of bladder cancer and bph. Will need outpt urology f/u which is scheduled next week History of Parkinson disease on Sinemet. History of dementia with worsening mental status on Aricept and also takes Seroquel.  Worsening mental status could be also acute from the sepsis. In mits with sitter. Right upper lobe lung mass: concerning for malignancy. No infectious symptoms. Pulm Shearon Stalls) consulted, will see. REM sleep disorder on Klonopin. History of sleep apnea. History of paroxysmal atrial fibrillation presently in sinus rhythm not on any anticoagulation.  Presently rate controlled.   DVT prophylaxis: lovenox Code Status: dnr Family Communication: partner updated @ bedside  Level of care: Telemetry Status is: Inpatient Remains inpatient appropriate because: severity of illness    Consultants:  none  Procedures: none  Antimicrobials:  Ceftriaxone/azithromycin    Subjective: No complaints  Objective: Vitals:   02/15/22 0730 02/15/22 0800 02/15/22 0830 02/15/22 1000  BP: (!) 157/62 (!) 161/67 (!) 163/66 (!) 155/83  Pulse: (!) 37 (!) 41 (!) 39 (!) 43  Resp: 18 20 (!) 22 (!) 21  Temp:      TempSrc:      SpO2: 99% 99% 99% 100%  Weight:      Height:        Intake/Output Summary (Last 24 hours) at 02/15/2022 1032 Last data filed at 02/14/2022 2258 Gross per 24 hour  Intake 2707.83 ml  Output --  Net 2707.83 ml   Filed Weights   02/14/22 1700  Weight: 78 kg  Examination:  General exam: Appears calm and comfortable  Respiratory system: Clear to auscultation save for rales righ tmid lung Cardiovascular system: S1 & S2 heard, RRR. No JVD, murmurs, rubs, gallops or clicks. No pedal edema. Gastrointestinal system: Abdomen is nondistended, soft and nontender. No organomegaly or masses felt. Normal bowel sounds heard. Central nervous system: Alert and oriented. No focal neurological deficits. Extremities: no edema, warm Skin: No rashes, lesions or ulcers Psychiatry:  flat affect, confused    Data Reviewed: I have personally reviewed following labs and imaging studies  CBC: Recent Labs  Lab 02/14/22 1701 02/14/22 2020 02/15/22 0730  WBC 17.8* 18.3* 17.4*  NEUTROABS 15.3*  --  14.8*  HGB 13.4 12.1* 12.5*  HCT 39.8 35.7* 36.4*  MCV 90.9 89.5 89.9  PLT 203 208 832   Basic Metabolic Panel: Recent Labs  Lab 02/14/22 1701 02/14/22 2020 02/15/22 0855  NA 142  --  140  K 3.8  --  3.1*  CL 107  --  108  CO2 24  --  25  GLUCOSE 115*  --  100*  BUN 22  --  14  CREATININE 0.85 0.81 0.77  CALCIUM 8.6*  --  7.5*   GFR: Estimated Creatinine Clearance: 88 mL/min (by C-G formula based on SCr of 0.77 mg/dL). Liver Function Tests: Recent Labs  Lab 02/14/22 1701 02/15/22 0855  AST 24 24  ALT 25 22  ALKPHOS 53 48  BILITOT 0.7 0.9  PROT 6.6 5.2*  ALBUMIN 3.3* 2.6*   No results for input(s): "LIPASE", "AMYLASE" in the last 168 hours. No results for input(s): "AMMONIA" in the last 168 hours. Coagulation Profile: Recent Labs  Lab 02/14/22 1701  INR 1.1   Cardiac Enzymes: No results for input(s): "CKTOTAL", "CKMB", "CKMBINDEX", "TROPONINI" in the last 168 hours. BNP (last 3 results) No results for input(s): "PROBNP" in the last 8760 hours. HbA1C: No results for input(s): "HGBA1C" in the last 72 hours. CBG: No results for input(s): "GLUCAP" in the last 168 hours. Lipid Profile: No results for input(s): "CHOL", "HDL", "LDLCALC", "TRIG", "CHOLHDL", "LDLDIRECT" in the last 72 hours. Thyroid Function Tests: No results for input(s): "TSH", "T4TOTAL", "FREET4", "T3FREE", "THYROIDAB" in the last 72 hours. Anemia Panel: No results for input(s): "VITAMINB12", "FOLATE", "FERRITIN", "TIBC", "IRON", "RETICCTPCT" in the last 72 hours. Urine analysis:    Component Value Date/Time   COLORURINE YELLOW 02/14/2022 1817   APPEARANCEUR HAZY (A) 02/14/2022 1817   LABSPEC 1.021 02/14/2022 1817   PHURINE 6.0 02/14/2022 1817   GLUCOSEU NEGATIVE  02/14/2022 1817   HGBUR MODERATE (A) 02/14/2022 1817   BILIRUBINUR NEGATIVE 02/14/2022 1817   KETONESUR 20 (A) 02/14/2022 1817   PROTEINUR 100 (A) 02/14/2022 1817   NITRITE POSITIVE (A) 02/14/2022 1817   LEUKOCYTESUR LARGE (A) 02/14/2022 1817   Sepsis Labs: '@LABRCNTIP'$ (procalcitonin:4,lacticidven:4)  ) Recent Results (from the past 240 hour(s))  Blood Culture (routine x 2)     Status: None (Preliminary result)   Collection Time: 02/14/22  5:01 PM   Specimen: BLOOD  Result Value Ref Range Status   Specimen Description   Final    BLOOD RIGHT ANTECUBITAL Performed at [facility], [place] [address] [place], [place] [address]    Special Requests   Final    BOTTLES DRAWN AEROBIC AND ANAEROBIC Blood Culture results may not be optimal due to an inadequate volume of blood received in culture bottles Performed at [facility] [address] [place], [place] [address]    Culture  Final    NO GROWTH < 24 HOURS Performed at [facility], [place] [address], [place], [place] [address]    Report Status PENDING  Incomplete  Blood Culture (routine x 2)     Status: None (Preliminary result)   Collection Time: 02/14/22  6:00 PM   Specimen: BLOOD  Result Value Ref Range Status   Specimen Description   Final    BLOOD BLOOD RIGHT FOREARM Performed at [facility] [address] [place], [place] [address]    Special Requests   Final    BOTTLES DRAWN AEROBIC AND ANAEROBIC Blood Culture results may not be optimal due to an inadequate volume of blood received in culture bottles Performed at [facility] [address], [place], [place] [address]    Culture   Final    NO GROWTH < 12 HOURS Performed at [facility] [address] [place], [place] [address]    Report Status PENDING  Incomplete  Resp Panel by RT-PCR (Flu A&B, Covid) Anterior Nasal Swab     Status: None   Collection Time: 02/14/22  7:22 PM    Specimen: Anterior Nasal Swab  Result Value Ref Range Status   SARS Coronavirus 2 by RT PCR NEGATIVE NEGATIVE Final    Comment: (NOTE) SARS-CoV-2 target nucleic acids are NOT DETECTED.  The SARS-CoV-2 RNA is generally detectable in upper respiratory specimens during the acute phase of infection. The lowest concentration of SARS-CoV-2 viral copies this assay can detect is 138 copies/mL. A negative result does not preclude SARS-Cov-2 infection and should not be used as the sole basis for treatment or other patient management decisions. A negative result may occur with  improper specimen collection/handling, submission of specimen other than nasopharyngeal swab, presence of viral mutation(s) within the areas targeted by this assay, and inadequate number of viral copies(<138 copies/mL). A negative result must be combined with clinical observations, patient history, and epidemiological information. The expected result is Negative.  Fact Sheet for Patients:  EntrepreneurPulse.com.au  Fact Sheet for Healthcare Providers:  IncredibleEmployment.be  This test is no t yet approved or cleared by the [place] FDA and  has been authorized for detection and/or diagnosis of SARS-CoV-2 by FDA under an Emergency Use Authorization (EUA). This EUA will remain  in effect (meaning this test can be used) for the duration of the COVID-19 declaration under Section 564(b)(1) of the Act, 21 U.S.C.section 360bbb-3(b)(1), unless the authorization is terminated  or revoked sooner.       Influenza A by PCR NEGATIVE NEGATIVE Final   Influenza B by PCR NEGATIVE NEGATIVE Final    Comment: (NOTE) The Xpert Xpress SARS-CoV-2/FLU/RSV plus assay is intended as an aid in the diagnosis of influenza from Nasopharyngeal swab specimens and should not be used as a sole basis for treatment. Nasal washings and aspirates are unacceptable for Xpert Xpress  SARS-CoV-2/FLU/RSV testing.  Fact Sheet for Patients: EntrepreneurPulse.com.au  Fact Sheet for Healthcare Providers: IncredibleEmployment.be  This test is not yet approved or cleared by the [place] FDA and has been authorized for detection and/or diagnosis of SARS-CoV-2 by FDA under an Emergency Use Authorization (EUA). This EUA will remain in effect (meaning this test can be used) for the duration of the COVID-19 declaration under Section 564(b)(1) of the Act, 21 U.S.C. section 360bbb-3(b)(1), unless the authorization is terminated or revoked.  Performed at [facility], [place] [address] [place], [place] [address]  Radiology Studies: CT Angio Chest Pulmonary Embolism (PE) W or WO Contrast  Result Date: 02/14/2022 CLINICAL DATA:  Concern for pulmonary embolism and mesenteric ischemia. EXAM: CT ANGIOGRAPHY CHEST, ABDOMEN AND PELVIS TECHNIQUE: Multidetector CT imaging through the chest, abdomen and pelvis was performed using the standard protocol during bolus administration of intravenous contrast. Multiplanar reconstructed images and MIPs were obtained and reviewed to evaluate the vascular anatomy. RADIATION DOSE REDUCTION: This exam was performed according to the departmental dose-optimization program which includes automated exposure control, adjustment of the mA and/or kV according to patient size and/or use of iterative reconstruction technique. CONTRAST:  175m OMNIPAQUE IOHEXOL 350 MG/ML SOLN COMPARISON:  Chest radiograph dated 02/14/2022 and CT abdomen pelvis dated 04/27/2013. FINDINGS: CTA CHEST FINDINGS Cardiovascular: There is no cardiomegaly or pericardial effusion. Mild atherosclerotic calcification of the aortic arch. No aneurysmal dilatation or dissection. The origins of the great vessels of the aortic arch appear patent. Evaluation of the pulmonary arteries is limited due to respiratory motion and suboptimal  visualization of the peripheral branches. No pulmonary artery embolus identified. Mediastinum/Nodes: Mildly enlarged right hilar lymph node measures 11 mm. No mediastinal adenopathy. The esophagus is grossly unremarkable no mediastinal fluid collection. Lungs/Pleura: There is a 4.5 x 3.3 cm masslike consolidation with central low attenuating/nonenhancing content in the peripheral right upper lobe. This may represent a focal infection with necrotic content or developing abscess. A malignancy is not entirely excluded. If there is clinical findings of infection, follow-up with CT after treatment recommended to document resolution. If no evidence of infection, multidisciplinary consult is advised to evaluate for possibility of malignancy. There are bibasilar subpleural atelectasis. A 7 mm partially calcified nodule in the left apex (30/2) may represent a partially calcified granuloma. Attention on follow-up imaging recommended. There is no pleural effusion pneumothorax. The central airways are patent. Musculoskeletal: No acute osseous pathology. Review of the MIP images confirms the above findings. CTA ABDOMEN AND PELVIS FINDINGS VASCULAR Aorta: Moderate atherosclerotic calcification. There is a 2.3 cm infrarenal aortic ectasia Celiac: There is atherosclerotic calcification of the origin celiac trunk. The celiac axis and its major branches are patent. SMA: The SMA is patent. Renals: Both renal arteries are patent without evidence of aneurysm, dissection, vasculitis, fibromuscular dysplasia or significant stenosis. IMA: Patent without evidence of aneurysm, dissection, vasculitis or significant stenosis. Inflow: Mild atherosclerotic calcification. No aneurysmal dilatation or dissection. Veins: No obvious venous abnormality within the limitations of this arterial phase study. Review of the MIP images confirms the above findings. NON-VASCULAR No intra-abdominal free air or free fluid. Hepatobiliary: The liver is  unremarkable no intrahepatic biliary dilatation. The gallbladder is unremarkable. Pancreas: Unremarkable. No pancreatic ductal dilatation or surrounding inflammatory changes. Spleen: Normal in size without focal abnormality. Adrenals/Urinary Tract: The adrenal glands unremarkable. Faint small right renal upper pole hypodense focus is not characterized, likely a cyst. There is no hydronephrosis on either side. The visualized ureters appear unremarkable. Mild diffuse trabeculated appearance of the bladder wall, likely related to chronic bladder outlet obstruction. Correlation with urinalysis recommended to exclude superimposed cystitis. Stomach/Bowel: There is thickened appearance of the rectal wall with mild perirectal edema which is suboptimally evaluated. A mildly enlarged and rounded left posterior perirectal lymph node (230/11) measures 11 mm. Although this may be inflammatory or infectious in etiology such as stercoral colitis, underlying malignancy is a concern. Correlation with clinical exam and direct visualization recommended. There is moderate stool throughout the colon. No bowel obstruction. The appendix is normal. Lymphatic: No retroperitoneal or mesenteric adenopathy. Left  posterior perirectal mildly enlarged lymph node measuring 11 mm. Reproductive: Mild enlargement of the prostate gland. Other: None Musculoskeletal: Degenerative changes of the spine. No acute osseous pathology. Review of the MIP images confirms the above findings. IMPRESSION: 1. No CT evidence of pulmonary artery embolus or aortic dissection. 2. Right upper lobe mass as above. Findings may represent pneumonia but concerning for malignancy. Clinical correlation and close follow-up to resolution or multi facility consult is advised. Please see above discussion. 3. Thickened rectal wall with left posterior perirectal mildly enlarged lymph nodes. Findings may be inflammatory/infectious but concerning for malignancy. Correlation with  clinical exam and direct visualization recommended. 4. Mildly enlarged right hilar lymph node. 5. No bowel obstruction. Normal appendix. 6. Aortic Atherosclerosis (ICD10-I70.0). Electronically Signed   By: Anner Crete M.D.   On: 02/14/2022 23:18   CT Angio Abd/Pel w/ and/or w/o  Result Date: 02/14/2022 CLINICAL DATA:  Concern for pulmonary embolism and mesenteric ischemia. EXAM: CT ANGIOGRAPHY CHEST, ABDOMEN AND PELVIS TECHNIQUE: Multidetector CT imaging through the chest, abdomen and pelvis was performed using the standard protocol during bolus administration of intravenous contrast. Multiplanar reconstructed images and MIPs were obtained and reviewed to evaluate the vascular anatomy. RADIATION DOSE REDUCTION: This exam was performed according to the departmental dose-optimization program which includes automated exposure control, adjustment of the mA and/or kV according to patient size and/or use of iterative reconstruction technique. CONTRAST:  139m OMNIPAQUE IOHEXOL 350 MG/ML SOLN COMPARISON:  Chest radiograph dated 02/14/2022 and CT abdomen pelvis dated 04/27/2013. FINDINGS: CTA CHEST FINDINGS Cardiovascular: There is no cardiomegaly or pericardial effusion. Mild atherosclerotic calcification of the aortic arch. No aneurysmal dilatation or dissection. The origins of the great vessels of the aortic arch appear patent. Evaluation of the pulmonary arteries is limited due to respiratory motion and suboptimal visualization of the peripheral branches. No pulmonary artery embolus identified. Mediastinum/Nodes: Mildly enlarged right hilar lymph node measures 11 mm. No mediastinal adenopathy. The esophagus is grossly unremarkable no mediastinal fluid collection. Lungs/Pleura: There is a 4.5 x 3.3 cm masslike consolidation with central low attenuating/nonenhancing content in the peripheral right upper lobe. This may represent a focal infection with necrotic content or developing abscess. A malignancy is not  entirely excluded. If there is clinical findings of infection, follow-up with CT after treatment recommended to document resolution. If no evidence of infection, multidisciplinary consult is advised to evaluate for possibility of malignancy. There are bibasilar subpleural atelectasis. A 7 mm partially calcified nodule in the left apex (30/2) may represent a partially calcified granuloma. Attention on follow-up imaging recommended. There is no pleural effusion pneumothorax. The central airways are patent. Musculoskeletal: No acute osseous pathology. Review of the MIP images confirms the above findings. CTA ABDOMEN AND PELVIS FINDINGS VASCULAR Aorta: Moderate atherosclerotic calcification. There is a 2.3 cm infrarenal aortic ectasia Celiac: There is atherosclerotic calcification of the origin celiac trunk. The celiac axis and its major branches are patent. SMA: The SMA is patent. Renals: Both renal arteries are patent without evidence of aneurysm, dissection, vasculitis, fibromuscular dysplasia or significant stenosis. IMA: Patent without evidence of aneurysm, dissection, vasculitis or significant stenosis. Inflow: Mild atherosclerotic calcification. No aneurysmal dilatation or dissection. Veins: No obvious venous abnormality within the limitations of this arterial phase study. Review of the MIP images confirms the above findings. NON-VASCULAR No intra-abdominal free air or free fluid. Hepatobiliary: The liver is unremarkable no intrahepatic biliary dilatation. The gallbladder is unremarkable. Pancreas: Unremarkable. No pancreatic ductal dilatation or surrounding inflammatory changes. Spleen: Normal in size  without focal abnormality. Adrenals/Urinary Tract: The adrenal glands unremarkable. Faint small right renal upper pole hypodense focus is not characterized, likely a cyst. There is no hydronephrosis on either side. The visualized ureters appear unremarkable. Mild diffuse trabeculated appearance of the bladder wall,  likely related to chronic bladder outlet obstruction. Correlation with urinalysis recommended to exclude superimposed cystitis. Stomach/Bowel: There is thickened appearance of the rectal wall with mild perirectal edema which is suboptimally evaluated. A mildly enlarged and rounded left posterior perirectal lymph node (230/11) measures 11 mm. Although this may be inflammatory or infectious in etiology such as stercoral colitis, underlying malignancy is a concern. Correlation with clinical exam and direct visualization recommended. There is moderate stool throughout the colon. No bowel obstruction. The appendix is normal. Lymphatic: No retroperitoneal or mesenteric adenopathy. Left posterior perirectal mildly enlarged lymph node measuring 11 mm. Reproductive: Mild enlargement of the prostate gland. Other: None Musculoskeletal: Degenerative changes of the spine. No acute osseous pathology. Review of the MIP images confirms the above findings. IMPRESSION: 1. No CT evidence of pulmonary artery embolus or aortic dissection. 2. Right upper lobe mass as above. Findings may represent pneumonia but concerning for malignancy. Clinical correlation and close follow-up to resolution or multi facility consult is advised. Please see above discussion. 3. Thickened rectal wall with left posterior perirectal mildly enlarged lymph nodes. Findings may be inflammatory/infectious but concerning for malignancy. Correlation with clinical exam and direct visualization recommended. 4. Mildly enlarged right hilar lymph node. 5. No bowel obstruction. Normal appendix. 6. Aortic Atherosclerosis (ICD10-I70.0). Electronically Signed   By: Anner Crete M.D.   On: 02/14/2022 23:18   DG Chest Port 1 View  Result Date: 02/14/2022 CLINICAL DATA:  Sepsis, weakness, diarrhea EXAM: PORTABLE CHEST 1 VIEW COMPARISON:  None Available. FINDINGS: Single frontal view of the chest demonstrates an unremarkable cardiac silhouette. Peripheral wedge-shaped  consolidation within the right mid chest could reflect bronchopneumonia. No effusion or pneumothorax. No acute bony abnormalities. IMPRESSION: 1. Peripheral wedge-shaped consolidation within the lateral right mid chest, compatible with bronchopneumonia in the appropriate setting. Followup PA and lateral chest X-ray is recommended in 3-4 weeks following trial of antibiotic therapy to ensure resolution and exclude underlying malignancy. Electronically Signed   By: Randa Ngo M.D.   On: 02/14/2022 17:26        Scheduled Meds:  carbidopa-levodopa  1 tablet Oral 3 times per day   clonazePAM  0.5 mg Oral QHS   donepezil  10 mg Oral QHS   enoxaparin (LOVENOX) injection  40 mg Subcutaneous Q24H   QUEtiapine  25 mg Oral BID   Continuous Infusions:  azithromycin Stopped (02/14/22 2138)   cefTRIAXone (ROCEPHIN)  IV Stopped (02/14/22 2126)   lactated ringers Stopped (02/15/22 0725)     LOS: 1 day     Desma Maxim, MD [facility]   If 7PM-7AM, please contact night-coverage [email or web address] Password [facility] 02/15/2022, 10:32 AM

## 2022-02-15 NOTE — ED Notes (Signed)
Bladder scan = 549 mLs

## 2022-02-15 NOTE — ED Notes (Signed)
BLADDER SCAN @ 2330

## 2022-02-15 NOTE — Consult Note (Addendum)
NAME:  Ryan Woodward, MRN:  [id number], DOB:  Jul 18, 1946, LOS: 1 ADMISSION DATE:  02/14/2022, CONSULTATION DATE:  7/17 REFERRING MD:  Si Raider, CHIEF COMPLAINT:    History of Present Illness:  76 year old male patient with fairly advanced dementia presented to the emergency room on 7/16 with 3-day history of worsening weakness multiple episodes of diarrhea and worsening confusion.  No recent sick exposures.  Of note had recently come to the hospital 3 days prior with urinary retention at which time a Foley catheter was placed.  In the ER was noted to have a fever of 102.3 he was tachycardic with heart rate in the 100s white blood cell count elevated.  Urinalysis worrisome for urinary tract infection.  Cultures were sent empiric antibiotics were initiated, and CT imaging was ordered to further evaluate the patient.  On CT imaging of the chest he was negative for pulmonary emboli however there was a Right upper lobe mass of varying density, as well as bilateral lower lobe airspace disease.  Pulmonary was asked to evaluate given the CT findings.  Pertinent  Medical History  Parkinson's dementia with significant functional decline over the last year and more specifically over the last 2 months.  BPH, paroxysmal atrial fibrillation, obstructive sleep apnea. Significant Hospital Events: Including procedures, antibiotic start and stop dates in addition to other pertinent events   7/16 admitted with sepsis in the context of urinary tract infection.  CT of chest showed 4.5 x 3.3 cm masslike consolidation with low attenuating/nonenhancing content in the right perihilar lower upper lobe. 7/17 pulmonary asked to evaluate.  Spoke at length with his spouse Cletus Gash.  He confirmed no desire for further evaluation in regards to possible malignancy however felt this was high probability of pulmonary abscess  Interim History / Subjective:  Confused  Objective   Blood pressure (Abnormal) 114/51, pulse (Abnormal) 36,  temperature 99.1 F (37.3 C), resp. rate (Abnormal) 21, height '6\' 4"'$  (1.93 m), weight 78 kg, SpO2 97 %.        Intake/Output Summary (Last 24 hours) at 02/15/2022 1319 Last data filed at 02/14/2022 2258 Gross per 24 hour  Intake 2707.83 ml  Output no documentation  Net 2707.83 ml   Filed Weights   02/14/22 1700  Weight: 78 kg    Examination: General: Chronically ill-appearing 76 year old male HENT: Normocephalic atraumatic jugular venous distention Lungs: Some scattered rhonchi no accessory use currently room air Cardiovascular: Regular rate and rhythm Abdomen: Soft not tender Extremities: Warm dry Neuro: Awake confused moves all extremities no focal deficits GU: Due to void  Resolved Hospital Problem list     Assessment & Plan:  Principal Problem:   Sepsis ([place]) Active Problems:   Mass of upper lobe of right lung   Benign prostate hyperplasia   Parkinson's disease (HCC)   Paroxysmal atrial fibrillation (HCC)   Obstructive sleep apnea   UTI (urinary tract infection)   CAP (community acquired pneumonia)   DNR (do not resuscitate)   RUL lung mass vs Pulmonary abscess -Given his fairly significant history of dementia I am inclined to think there is a high probability of this being pulmonary abscess in the context of silent aspiration, as opposed to malignancy.  At any rate after a long discussion with her spouse Cletus Gash, he does not want to proceed with any sort of invasive diagnostic evaluation   Plan/recommendation Admit to internal medicine service Agree with DNR status Continue antibiotic coverage for urinary tract infection, at time of discharge he should continue  antibiotics for at least 2 weeks with follow-up CXR at least  to determine if prolonged antibiotic course needed If no change would typically recommend tissue bx at that point however as per my d/w his spouse, he would not want to undergo further diagnostic eval such as bronch on CT guided lung bx as he  would not be a candidate for cancer treatment even if found given his significant decline over the last year  Would benefit from swallowing evaluation  All other issues as directed by internal medicine service. est Practice (right click and "Reselect all SmartList Selections" daily)   Per primary   Labs   CBC: Recent Labs  Lab 02/14/22 1701 02/14/22 2020 02/15/22 0730  WBC 17.8* 18.3* 17.4*  NEUTROABS 15.3*  --  14.8*  HGB 13.4 12.1* 12.5*  HCT 39.8 35.7* 36.4*  MCV 90.9 89.5 89.9  PLT 203 208 762    Basic Metabolic Panel: Recent Labs  Lab 02/14/22 1701 02/14/22 2020 02/15/22 0855  NA 142  --  140  K 3.8  --  3.1*  CL 107  --  108  CO2 24  --  25  GLUCOSE 115*  --  100*  BUN 22  --  14  CREATININE 0.85 0.81 0.77  CALCIUM 8.6*  --  7.5*   GFR: Estimated Creatinine Clearance: 88 mL/min (by C-G formula based on SCr of 0.77 mg/dL). Recent Labs  Lab 02/14/22 1701 02/14/22 1935 02/14/22 2020 02/15/22 0730  WBC 17.8*  --  18.3* 17.4*  LATICACIDVEN 1.4 0.8  --   --     Liver Function Tests: Recent Labs  Lab 02/14/22 1701 02/15/22 0855  AST 24 24  ALT 25 22  ALKPHOS 53 48  BILITOT 0.7 0.9  PROT 6.6 5.2*  ALBUMIN 3.3* 2.6*   No results for input(s): "LIPASE", "AMYLASE" in the last 168 hours. No results for input(s): "AMMONIA" in the last 168 hours.  ABG No results found for: "PHART", "PCO2ART", "PO2ART", "HCO3", "TCO2", "ACIDBASEDEF", "O2SAT"   Coagulation Profile: Recent Labs  Lab 02/14/22 1701  INR 1.1    Cardiac Enzymes: No results for input(s): "CKTOTAL", "CKMB", "CKMBINDEX", "TROPONINI" in the last 168 hours.  HbA1C: No results found for: "HGBA1C"  CBG: No results for input(s): "GLUCAP" in the last 168 hours.  Review of Systems:   Unable to participate Past Medical History:  He,  has a past medical history of A-fib ([place]), Allergies, Back pain, Bladder cancer ([place]) (dx aug'12), Enlarged prostate with urinary obstruction, GERD  (gastroesophageal reflux disease), OSA on CPAP, PD (Parkinson's disease) ([place]), and Skin cancer of nose (05/2018).   Surgical History:   Past Surgical History:  Procedure Laterality Date   CERVICAL FUSION  2000   C4-5   COLONOSCOPY     CYSTOSCOPY  06/16/2011   Procedure: CYSTOSCOPY;  Surgeon: Molli Hazard, MD;  Location: [facility];  Service: Urology;  Laterality: N/A;   CYSTOSCOPY WITH URETHRAL DILATATION  w/ TURBT BX   05-04-11   INGUINAL HERNIA REPAIR Bilateral 02/07/2019   Procedure: BILATERAL INGUINAL HERNIA REPAIR WITH MESH;  Surgeon: Donnie Mesa, MD;  Location: [place];  Service: General;  Laterality: Bilateral;  GENERAL AND TAP BLOCK ANESTHESIA   INSERTION OF MESH Bilateral 02/07/2019   Procedure: INSERTION OF MESH;  Surgeon: Donnie Mesa, MD;  Location: [place];  Service: General;  Laterality: Bilateral;   SINUS SURGERY     TRANSURETHRAL RESECTION OF PROSTATE  06/16/2011  Procedure: TRANSURETHRAL RESECTION OF THE PROSTATE (TURP);  Surgeon: Molli Hazard, MD;  Location: [facility];  Service: Urology;  Laterality: N/A;  URETHRAL DILATATION, MEATOTOMY, PENIL BLOCK     Social History:   reports that he quit smoking about 36 years ago. His smoking use included cigarettes. He has never used smokeless tobacco. He reports current alcohol use of about 14.0 standard drinks of alcohol per week. He reports that he does not use drugs.   Family History:  His family history includes Alzheimer's disease in his maternal uncle and mother; Stroke in his mother. There is no history of Parkinson's disease.   Allergies No Known Allergies   Home Medications  Prior to Admission medications   Medication Sig Start Date End Date Taking? Authorizing Provider  acetaminophen (TYLENOL) 325 MG tablet Take 325-650 mg by mouth every 6 (six) hours as needed for mild pain or headache.   Yes [provider]   carbidopa-levodopa (SINEMET IR) 25-100 MG tablet TAKE 1 TAB 3 XDAILY TAKE 4-5HRS APART FOR EXAMPLE 8AM 12 & 4PM OR SIMILAR DO NOT EAT PROTEIN 30 MIM Patient taking differently: Take 1 tablet by mouth See admin instructions. Take 1 tablet by mouth at 8:00-8:30 AM, 12 NOON, and 6 PM daily 01/28/22  Yes Melvenia Beam, MD  clonazePAM (KLONOPIN) 0.5 MG tablet At bedtime prn for REM BD Patient taking differently: Take 0.5 mg by mouth at bedtime. 11/12/21  Yes Dohmeier, Asencion Partridge, MD  donepezil (ARICEPT) 10 MG tablet Take 1 tablet (10 mg total) by mouth at bedtime. 08/10/21  Yes Melvenia Beam, MD  fluticasone (FLONASE) 50 MCG/ACT nasal spray Place 2 sprays into both nostrils daily as needed for allergies or rhinitis.   Yes [provider]  Multiple Vitamins-Minerals (MULTIVITAMIN PO) Take 1 tablet by mouth daily with breakfast.   Yes [provider]  QUEtiapine (SEROQUEL) 25 MG tablet Take 1 tablet (25 mg total) by mouth 2 (two) times daily. Patient taking differently: Take 25 mg by mouth in the morning and at bedtime. 08/10/21  Yes Melvenia Beam, MD  sildenafil (REVATIO) 20 MG tablet Take 20 mg by mouth 3 (three) times daily as needed.    [provider]     Critical care time: NA     Erick Colace ACNP-BC [place] Pager # [phone] OR # [phone] if no answer

## 2022-02-16 ENCOUNTER — Inpatient Hospital Stay (HOSPITAL_COMMUNITY): Payer: Medicare PPO

## 2022-02-16 DIAGNOSIS — Z66 Do not resuscitate: Secondary | ICD-10-CM | POA: Diagnosis not present

## 2022-02-16 DIAGNOSIS — A419 Sepsis, unspecified organism: Secondary | ICD-10-CM | POA: Diagnosis not present

## 2022-02-16 LAB — URINE CULTURE: Culture: >=100000 — AB

## 2022-02-16 LAB — MAGNESIUM: Magnesium: 2.3 mg/dL (ref 1.7–2.4)

## 2022-02-16 MED ORDER — HALOPERIDOL LACTATE 5 MG/ML IJ SOLN
1.0000 mg | Freq: Four times a day (QID) | INTRAMUSCULAR | Status: DC | PRN
  Administered 2022-02-16 – 2022-02-17 (×2): 1 mg via INTRAVENOUS
  Filled 2022-02-16 (×2): qty 1

## 2022-02-16 MED ORDER — SODIUM CHLORIDE 0.9 % IV SOLN
3.0000 g | Freq: Four times a day (QID) | INTRAVENOUS | Status: DC
  Administered 2022-02-16 – 2022-02-18 (×7): 3 g via INTRAVENOUS
  Filled 2022-02-16 (×9): qty 8

## 2022-02-16 MED ORDER — POLYETHYLENE GLYCOL 3350 17 G PO PACK
17.0000 g | PACK | Freq: Every day | ORAL | Status: DC
  Administered 2022-02-17 – 2022-02-20 (×4): 17 g via ORAL
  Filled 2022-02-16 (×3): qty 1

## 2022-02-16 MED ORDER — SENNOSIDES-DOCUSATE SODIUM 8.6-50 MG PO TABS
2.0000 | ORAL_TABLET | Freq: Two times a day (BID) | ORAL | Status: DC
  Administered 2022-02-16 – 2022-02-20 (×10): 2 via ORAL
  Filled 2022-02-16 (×10): qty 2

## 2022-02-16 MED ORDER — TRAZODONE HCL 50 MG PO TABS
50.0000 mg | ORAL_TABLET | Freq: Every evening | ORAL | Status: DC
  Administered 2022-02-16 – 2022-02-20 (×4): 50 mg via ORAL
  Filled 2022-02-16 (×6): qty 1

## 2022-02-16 NOTE — TOC Initial Note (Signed)
Transition of Care [facility]) - Initial/Assessment Note    Patient Details  Name: Ryan Woodward MRN: [id number] Date of Birth: 1946-01-25  Transition of Care (TOC) CM/SW Contact:    Joaquin Courts, RN Phone Number: 02/16/2022, 10:26 AM  Clinical Narrative:                 TOC reviewed chart and noted patient is from home, has a pcp, and remains on room air.  TOC has outreached to provider with a request for PT/OT evaluation for discharge planning.  TOC will continue to follow for possible needs.  Expected Discharge Plan: Home/Self Care Barriers to Discharge: Continued Medical Work up   Patient Goals and CMS Choice Patient states their goals for this hospitalization and ongoing recovery are:: to get better      Expected Discharge Plan and Services Expected Discharge Plan: Home/Self Care       Living arrangements for the past 2 months: Single Family Home                                      Prior Living Arrangements/Services Living arrangements for the past 2 months: Single Family Home   Patient language and need for interpreter reviewed:: Yes Do you feel safe going back to the place where you live?: Yes      Need for Family Participation in Patient Care: Yes (Comment) Care giver support system in place?: Yes (comment)   Criminal Activity/Legal Involvement Pertinent to Current Situation/Hospitalization: No - Comment as needed  Activities of Daily Living Home Assistive Devices/Equipment: CPAP ADL Screening (condition at time of admission) Patient's cognitive ability adequate to safely complete daily activities?: No Is the patient deaf or have difficulty hearing?: No Does the patient have difficulty seeing, even when wearing glasses/contacts?: No Does the patient have difficulty concentrating, remembering, or making decisions?: Yes Patient able to express need for assistance with ADLs?: Yes Does the patient have difficulty dressing or bathing?: No Independently  performs ADLs?: No Communication: Independent Dressing (OT): Needs assistance Is this a change from baseline?: Change from baseline, expected to last <3days Grooming: Needs assistance Is this a change from baseline?: Change from baseline, expected to last >3 days Feeding: Needs assistance Is this a change from baseline?: Change from baseline, expected to last >3 days Bathing: Needs assistance Is this a change from baseline?: Change from baseline, expected to last >3 days Toileting: Needs assistance Is this a change from baseline?: Change from baseline, expected to last <3 days In/Out Bed: Independent Walks in Home: Independent Does the patient have difficulty walking or climbing stairs?: No Weakness of Legs: None Weakness of Arms/Hands: Both  Permission Sought/Granted                  Emotional Assessment Appearance:: Appears stated age     Orientation: : Oriented to Self, Oriented to Place, Fluctuating Orientation (Suspected and/or reported [employer])   Psych Involvement: No (comment)  Admission diagnosis:  SIRS (systemic inflammatory response syndrome) ([place]) [R65.10] Sepsis ([place]) [A41.9] Urinary tract infection associated with indwelling urethral catheter, initial encounter ([place]) [D92.426S, N39.0] Community acquired pneumonia, unspecified laterality [J18.9] Sepsis, due to unspecified organism, unspecified whether acute organ dysfunction present [facility]) [A41.9] Patient Active Problem List   Diagnosis Date Noted   DNR (do not resuscitate) 02/15/2022   Mass of upper lobe of right lung 02/15/2022   SIRS (systemic inflammatory response syndrome) ([place]) 02/14/2022  Sepsis ([place]) 02/14/2022   UTI (urinary tract infection) 02/14/2022   CAP (community acquired pneumonia) 02/14/2022   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 ([place]) 08/10/2021   Gait abnormality 08/10/2021   Feels cold 08/10/2021   Hallucinations 08/10/2021   Preop cardiovascular exam 01/17/2019   Paroxysmal  atrial fibrillation ([place]) 09/20/2018   Obstructive sleep apnea 09/20/2018   Palpitations 09/20/2018   Parkinson's disease ([place]) 08/11/2018   Memory loss 05/24/2018   Benign prostate hyperplasia 06/16/2011   PCP:  Lawerance Cruel, MD Pharmacy:   [id number] [place] [address]Phone: [phone]: [phone]    Social Determinants of Health (SDOH) Interventions    Readmission Risk Interventions     No data to display

## 2022-02-16 NOTE — Progress Notes (Addendum)
PROGRESS NOTE    [name]  NLZ:[id number] DOB: 07-30-1946 DOA: 02/14/2022 PCP: Lawerance Cruel, MD     Brief Narrative:  ISRRAEL Woodward is a 76 y.o. male with history of Parkinson's, dementia, sleep disorder had come to the ER 3 days ago for urinary retention at the time had Foley catheter placed and discharged home.  As per patient's spouse over the last 3 days patient has become more weak and also has been in multiple episodes of watery diarrhea no vomiting but did complain of some abdominal discomfort. ED Course: Febrile -102.3 F , UA is consistent with UTI and chest x-ray shows which is hazy defect concerning for pneumonia.  On further work-up noted to have lung abscess as well, pulmonary consulted, work-up notable for dysphagia -Hospitalization complicated by delirium  Assessment & Plan:   Sepsis -Secondary to UTI and lung abscess -Will change antibiotics to IV Unasyn, appreciate pulmonary input, recommended at least 2 weeks of antibiotics, transition to Augmentin at DC, followed by imaging in 2 weeks, may need longer antibiotic course thereafter  Lung mass, likely abscess -See discussion above, appreciate pulmonary input -Needs follow-up imaging after antibiotic course is completed  Urinary retention -Continue Flomax, Foley replaced 7/17 for recurrent retention -Follow-up with urology in 1 to 2 weeks  UTI -Likely secondary to retention, urine culture growing pansensitive E. Coli -On IV Unasyn now which also covers his lung abscess  History of Parkinson's disease Parkinson's dementia Delirium -With ongoing cognitive decline, remains on Aricept and Seroquel -Intermittent agitation, will DC PRN Ativan, may increase Seroquel as needed -Home regimen of low-dose p.m. Klonopin continued -PT OT eval, anticipate need for placement and palliative care eval in the near future  Rectal thickening -Noted on imaging, history of chronic constipation, start Senokot twice daily,  MiraLAX, partner reports remote colonoscopy over 5 to 10 years ago -Would be very difficult to pursue colonoscopy in the setting of dementia, cognitive decline and numerous other active problems at this time, conservative management unless he develops acute complications  History of paroxysmal A-fib -Not on anticoagulation at baseline, rate controlled currently in sinus rhythm   DVT prophylaxis: lovenox Code Status: DNR Family Communication: Discussed with partner at bedside Dispo: Anticipate need for SNF, PT OT eval pending  Antimicrobials:  Ceftriaxone/azithromycin    Subjective: -No bowel movement overnight, did not get laxatives, intermittent productive cough, feels weak  Objective: Vitals:   02/15/22 1844 02/15/22 2244 02/16/22 0245 02/16/22 0607  BP: (!) 156/99 (!) 151/67 134/89 (!) 138/95  Pulse: 73 (!) 41 79 83  Resp: '14 20 18 18  '$ Temp: 98.6 F (37 C) 98.1 F (36.7 C) 97.8 F (36.6 C) 99.5 F (37.5 C)  TempSrc: Oral Oral  Oral  SpO2: 98% 96% 97% 98%  Weight:      Height:        Intake/Output Summary (Last 24 hours) at 02/16/2022 1125 Last data filed at 02/16/2022 0950 Gross per 24 hour  Intake 240 ml  Output 1600 ml  Net -1360 ml   Filed Weights   02/14/22 1700  Weight: 78 kg    Examination:  General exam: Chronically ill male sitting up in bed, awake alert oriented to self only, moderate cognitive deficits HEENT: No JVD CVS: S1-S2, regular rhythm Lungs: Scattered rhonchi and conducted upper airway sounds Abdomen: Soft, mildly distended, nontender, bowel sounds present Extremities: No edema  Psychiatry: flat affect, confused    Data Reviewed: I have personally reviewed following labs and imaging  studies  CBC: Recent Labs  Lab 02/14/22 1701 02/14/22 2020 02/15/22 0730  WBC 17.8* 18.3* 17.4*  NEUTROABS 15.3*  --  14.8*  HGB 13.4 12.1* 12.5*  HCT 39.8 35.7* 36.4*  MCV 90.9 89.5 89.9  PLT 203 208 517   Basic Metabolic Panel: Recent Labs   Lab 02/14/22 1701 02/14/22 2020 02/15/22 0855 02/16/22 0620  NA 142  --  140  --   K 3.8  --  3.1*  --   CL 107  --  108  --   CO2 24  --  25  --   GLUCOSE 115*  --  100*  --   BUN 22  --  14  --   CREATININE 0.85 0.81 0.77  --   CALCIUM 8.6*  --  7.5*  --   MG  --   --   --  2.3   GFR: Estimated Creatinine Clearance: 88 mL/min (by C-G formula based on SCr of 0.77 mg/dL). Liver Function Tests: Recent Labs  Lab 02/14/22 1701 02/15/22 0855  AST 24 24  ALT 25 22  ALKPHOS 53 48  BILITOT 0.7 0.9  PROT 6.6 5.2*  ALBUMIN 3.3* 2.6*   No results for input(s): "LIPASE", "AMYLASE" in the last 168 hours. No results for input(s): "AMMONIA" in the last 168 hours. Coagulation Profile: Recent Labs  Lab 02/14/22 1701  INR 1.1   Cardiac Enzymes: No results for input(s): "CKTOTAL", "CKMB", "CKMBINDEX", "TROPONINI" in the last 168 hours. BNP (last 3 results) No results for input(s): "PROBNP" in the last 8760 hours. HbA1C: No results for input(s): "HGBA1C" in the last 72 hours. CBG: No results for input(s): "GLUCAP" in the last 168 hours. Lipid Profile: No results for input(s): "CHOL", "HDL", "LDLCALC", "TRIG", "CHOLHDL", "LDLDIRECT" in the last 72 hours. Thyroid Function Tests: No results for input(s): "TSH", "T4TOTAL", "FREET4", "T3FREE", "THYROIDAB" in the last 72 hours. Anemia Panel: No results for input(s): "VITAMINB12", "FOLATE", "FERRITIN", "TIBC", "IRON", "RETICCTPCT" in the last 72 hours. Urine analysis:    Component Value Date/Time   COLORURINE YELLOW 02/14/2022 1817   APPEARANCEUR HAZY (A) 02/14/2022 1817   LABSPEC 1.021 02/14/2022 1817   PHURINE 6.0 02/14/2022 1817   GLUCOSEU NEGATIVE 02/14/2022 1817   HGBUR MODERATE (A) 02/14/2022 1817   BILIRUBINUR NEGATIVE 02/14/2022 1817   KETONESUR 20 (A) 02/14/2022 1817   PROTEINUR 100 (A) 02/14/2022 1817   NITRITE POSITIVE (A) 02/14/2022 1817   LEUKOCYTESUR LARGE (A) 02/14/2022 1817   Sepsis  Labs: '@LABRCNTIP'$ (procalcitonin:4,lacticidven:4)  ) Recent Results (from the past 240 hour(s))  Blood Culture (routine x 2)     Status: None (Preliminary result)   Collection Time: 02/14/22  5:01 PM   Specimen: BLOOD  Result Value Ref Range Status   Specimen Description   Final    BLOOD RIGHT ANTECUBITAL Performed at [facility], [place] [address] [place], [place] [address]    Special Requests   Final    BOTTLES DRAWN AEROBIC AND ANAEROBIC Blood Culture results may not be optimal due to an inadequate volume of blood received in culture bottles Performed at [facility] [address] [place], [place] [address]    Culture   Final    NO GROWTH < 24 HOURS Performed at [facility] [address], [place], [place] [address]    Report Status PENDING  Incomplete  Blood Culture (routine x 2)     Status: None (Preliminary result)   Collection Time: 02/14/22  6:00  PM   Specimen: BLOOD  Result Value Ref Range Status   Specimen Description   Final    BLOOD BLOOD RIGHT FOREARM Performed at [facility] [address] [place], [place] [address]    Special Requests   Final    BOTTLES DRAWN AEROBIC AND ANAEROBIC Blood Culture results may not be optimal due to an inadequate volume of blood received in culture bottles Performed at [facility] [address], [place], [place] [address]    Culture   Final    NO GROWTH < 12 HOURS Performed at [facility] [address], [place], [place] [address]    Report Status PENDING  Incomplete  Urine Culture     Status: Abnormal   Collection Time: 02/14/22  6:17 PM   Specimen: In/Out Cath Urine  Result Value Ref Range Status   Specimen Description   Final    IN/OUT CATH URINE Performed at [facility] [address] [place], [place] [address]    Special Requests   Final    NONE Performed at [facility], [place]  [address] [place], [place] [address]    Culture >=100,000 COLONIES/mL ESCHERICHIA COLI (A)  Final   Report Status 02/16/2022 FINAL  Final   Organism ID, Bacteria ESCHERICHIA COLI (A)  Final      Susceptibility   Escherichia coli - MIC*    AMPICILLIN 8 SENSITIVE Sensitive     CEFAZOLIN <=4 SENSITIVE Sensitive     CEFEPIME <=0.12 SENSITIVE Sensitive     CEFTRIAXONE <=0.25 SENSITIVE Sensitive     CIPROFLOXACIN <=0.25 SENSITIVE Sensitive     GENTAMICIN <=1 SENSITIVE Sensitive     IMIPENEM <=0.25 SENSITIVE Sensitive     NITROFURANTOIN <=16 SENSITIVE Sensitive     TRIMETH/SULFA <=20 SENSITIVE Sensitive     AMPICILLIN/SULBACTAM <=2 SENSITIVE Sensitive     PIP/TAZO <=4 SENSITIVE Sensitive     * >=100,000 COLONIES/mL ESCHERICHIA COLI  Resp Panel by RT-PCR (Flu A&B, Covid) Anterior Nasal Swab     Status: None   Collection Time: 02/14/22  7:22 PM   Specimen: Anterior Nasal Swab  Result Value Ref Range Status   SARS Coronavirus 2 by RT PCR NEGATIVE NEGATIVE Final    Comment: (NOTE) SARS-CoV-2 target nucleic acids are NOT DETECTED.  The SARS-CoV-2 RNA is generally detectable in upper respiratory specimens during the acute phase of infection. The lowest concentration of SARS-CoV-2 viral copies this assay can detect is 138 copies/mL. A negative result does not preclude SARS-Cov-2 infection and should not be used as the sole basis for treatment or other patient management decisions. A negative result may occur with  improper specimen collection/handling, submission of specimen other than nasopharyngeal swab, presence of viral mutation(s) within the areas targeted by this assay, and inadequate number of viral copies(<138 copies/mL). A negative result must be combined with clinical observations, patient history, and epidemiological information. The expected result is Negative.  Fact Sheet for Patients:  EntrepreneurPulse.com.au  Fact Sheet for Healthcare Providers:   IncredibleEmployment.be  This test is no t yet approved or cleared by the [place] FDA and  has been authorized for detection and/or diagnosis of SARS-CoV-2 by FDA under an Emergency Use Authorization (EUA). This EUA will remain  in effect (meaning this test can be used) for the duration of the COVID-19 declaration under Section 564(b)(1) of the Act, 21 U.S.C.section 360bbb-3(b)(1), unless the authorization is terminated  or revoked sooner.  Influenza A by PCR NEGATIVE NEGATIVE Final   Influenza B by PCR NEGATIVE NEGATIVE Final    Comment: (NOTE) The Xpert Xpress SARS-CoV-2/FLU/RSV plus assay is intended as an aid in the diagnosis of influenza from Nasopharyngeal swab specimens and should not be used as a sole basis for treatment. Nasal washings and aspirates are unacceptable for Xpert Xpress SARS-CoV-2/FLU/RSV testing.  Fact Sheet for Patients: EntrepreneurPulse.com.au  Fact Sheet for Healthcare Providers: IncredibleEmployment.be  This test is not yet approved or cleared by the [place] FDA and has been authorized for detection and/or diagnosis of SARS-CoV-2 by FDA under an Emergency Use Authorization (EUA). This EUA will remain in effect (meaning this test can be used) for the duration of the COVID-19 declaration under Section 564(b)(1) of the Act, 21 U.S.C. section 360bbb-3(b)(1), unless the authorization is terminated or revoked.  Performed at [facility], [place] [address], [place], [place] [address]          Radiology Studies: CT Angio Chest Pulmonary Embolism (PE) W or WO Contrast  Result Date: 02/14/2022 CLINICAL DATA:  Concern for pulmonary embolism and mesenteric ischemia. EXAM: CT ANGIOGRAPHY CHEST, ABDOMEN AND PELVIS TECHNIQUE: Multidetector CT imaging through the chest, abdomen and pelvis was performed using the standard protocol during bolus administration of intravenous  contrast. Multiplanar reconstructed images and MIPs were obtained and reviewed to evaluate the vascular anatomy. RADIATION DOSE REDUCTION: This exam was performed according to the departmental dose-optimization program which includes automated exposure control, adjustment of the mA and/or kV according to patient size and/or use of iterative reconstruction technique. CONTRAST:  13m OMNIPAQUE IOHEXOL 350 MG/ML SOLN COMPARISON:  Chest radiograph dated 02/14/2022 and CT abdomen pelvis dated 04/27/2013. FINDINGS: CTA CHEST FINDINGS Cardiovascular: There is no cardiomegaly or pericardial effusion. Mild atherosclerotic calcification of the aortic arch. No aneurysmal dilatation or dissection. The origins of the great vessels of the aortic arch appear patent. Evaluation of the pulmonary arteries is limited due to respiratory motion and suboptimal visualization of the peripheral branches. No pulmonary artery embolus identified. Mediastinum/Nodes: Mildly enlarged right hilar lymph node measures 11 mm. No mediastinal adenopathy. The esophagus is grossly unremarkable no mediastinal fluid collection. Lungs/Pleura: There is a 4.5 x 3.3 cm masslike consolidation with central low attenuating/nonenhancing content in the peripheral right upper lobe. This may represent a focal infection with necrotic content or developing abscess. A malignancy is not entirely excluded. If there is clinical findings of infection, follow-up with CT after treatment recommended to document resolution. If no evidence of infection, multidisciplinary consult is advised to evaluate for possibility of malignancy. There are bibasilar subpleural atelectasis. A 7 mm partially calcified nodule in the left apex (30/2) may represent a partially calcified granuloma. Attention on follow-up imaging recommended. There is no pleural effusion pneumothorax. The central airways are patent. Musculoskeletal: No acute osseous pathology. Review of the MIP images confirms the  above findings. CTA ABDOMEN AND PELVIS FINDINGS VASCULAR Aorta: Moderate atherosclerotic calcification. There is a 2.3 cm infrarenal aortic ectasia Celiac: There is atherosclerotic calcification of the origin celiac trunk. The celiac axis and its major branches are patent. SMA: The SMA is patent. Renals: Both renal arteries are patent without evidence of aneurysm, dissection, vasculitis, fibromuscular dysplasia or significant stenosis. IMA: Patent without evidence of aneurysm, dissection, vasculitis or significant stenosis. Inflow: Mild atherosclerotic calcification. No aneurysmal dilatation or dissection. Veins: No obvious venous abnormality within the limitations of this arterial phase study. Review of the MIP images confirms the above findings. NON-VASCULAR No intra-abdominal free  air or free fluid. Hepatobiliary: The liver is unremarkable no intrahepatic biliary dilatation. The gallbladder is unremarkable. Pancreas: Unremarkable. No pancreatic ductal dilatation or surrounding inflammatory changes. Spleen: Normal in size without focal abnormality. Adrenals/Urinary Tract: The adrenal glands unremarkable. Faint small right renal upper pole hypodense focus is not characterized, likely a cyst. There is no hydronephrosis on either side. The visualized ureters appear unremarkable. Mild diffuse trabeculated appearance of the bladder wall, likely related to chronic bladder outlet obstruction. Correlation with urinalysis recommended to exclude superimposed cystitis. Stomach/Bowel: There is thickened appearance of the rectal wall with mild perirectal edema which is suboptimally evaluated. A mildly enlarged and rounded left posterior perirectal lymph node (230/11) measures 11 mm. Although this may be inflammatory or infectious in etiology such as stercoral colitis, underlying malignancy is a concern. Correlation with clinical exam and direct visualization recommended. There is moderate stool throughout the colon. No bowel  obstruction. The appendix is normal. Lymphatic: No retroperitoneal or mesenteric adenopathy. Left posterior perirectal mildly enlarged lymph node measuring 11 mm. Reproductive: Mild enlargement of the prostate gland. Other: None Musculoskeletal: Degenerative changes of the spine. No acute osseous pathology. Review of the MIP images confirms the above findings. IMPRESSION: 1. No CT evidence of pulmonary artery embolus or aortic dissection. 2. Right upper lobe mass as above. Findings may represent pneumonia but concerning for malignancy. Clinical correlation and close follow-up to resolution or multi facility consult is advised. Please see above discussion. 3. Thickened rectal wall with left posterior perirectal mildly enlarged lymph nodes. Findings may be inflammatory/infectious but concerning for malignancy. Correlation with clinical exam and direct visualization recommended. 4. Mildly enlarged right hilar lymph node. 5. No bowel obstruction. Normal appendix. 6. Aortic Atherosclerosis (ICD10-I70.0). Electronically Signed   By: Anner Crete M.D.   On: 02/14/2022 23:18   CT Angio Abd/Pel w/ and/or w/o  Result Date: 02/14/2022 CLINICAL DATA:  Concern for pulmonary embolism and mesenteric ischemia. EXAM: CT ANGIOGRAPHY CHEST, ABDOMEN AND PELVIS TECHNIQUE: Multidetector CT imaging through the chest, abdomen and pelvis was performed using the standard protocol during bolus administration of intravenous contrast. Multiplanar reconstructed images and MIPs were obtained and reviewed to evaluate the vascular anatomy. RADIATION DOSE REDUCTION: This exam was performed according to the departmental dose-optimization program which includes automated exposure control, adjustment of the mA and/or kV according to patient size and/or use of iterative reconstruction technique. CONTRAST:  174m OMNIPAQUE IOHEXOL 350 MG/ML SOLN COMPARISON:  Chest radiograph dated 02/14/2022 and CT abdomen pelvis dated 04/27/2013. FINDINGS: CTA  CHEST FINDINGS Cardiovascular: There is no cardiomegaly or pericardial effusion. Mild atherosclerotic calcification of the aortic arch. No aneurysmal dilatation or dissection. The origins of the great vessels of the aortic arch appear patent. Evaluation of the pulmonary arteries is limited due to respiratory motion and suboptimal visualization of the peripheral branches. No pulmonary artery embolus identified. Mediastinum/Nodes: Mildly enlarged right hilar lymph node measures 11 mm. No mediastinal adenopathy. The esophagus is grossly unremarkable no mediastinal fluid collection. Lungs/Pleura: There is a 4.5 x 3.3 cm masslike consolidation with central low attenuating/nonenhancing content in the peripheral right upper lobe. This may represent a focal infection with necrotic content or developing abscess. A malignancy is not entirely excluded. If there is clinical findings of infection, follow-up with CT after treatment recommended to document resolution. If no evidence of infection, multidisciplinary consult is advised to evaluate for possibility of malignancy. There are bibasilar subpleural atelectasis. A 7 mm partially calcified nodule in the left apex (30/2) may represent a partially calcified  granuloma. Attention on follow-up imaging recommended. There is no pleural effusion pneumothorax. The central airways are patent. Musculoskeletal: No acute osseous pathology. Review of the MIP images confirms the above findings. CTA ABDOMEN AND PELVIS FINDINGS VASCULAR Aorta: Moderate atherosclerotic calcification. There is a 2.3 cm infrarenal aortic ectasia Celiac: There is atherosclerotic calcification of the origin celiac trunk. The celiac axis and its major branches are patent. SMA: The SMA is patent. Renals: Both renal arteries are patent without evidence of aneurysm, dissection, vasculitis, fibromuscular dysplasia or significant stenosis. IMA: Patent without evidence of aneurysm, dissection, vasculitis or significant  stenosis. Inflow: Mild atherosclerotic calcification. No aneurysmal dilatation or dissection. Veins: No obvious venous abnormality within the limitations of this arterial phase study. Review of the MIP images confirms the above findings. NON-VASCULAR No intra-abdominal free air or free fluid. Hepatobiliary: The liver is unremarkable no intrahepatic biliary dilatation. The gallbladder is unremarkable. Pancreas: Unremarkable. No pancreatic ductal dilatation or surrounding inflammatory changes. Spleen: Normal in size without focal abnormality. Adrenals/Urinary Tract: The adrenal glands unremarkable. Faint small right renal upper pole hypodense focus is not characterized, likely a cyst. There is no hydronephrosis on either side. The visualized ureters appear unremarkable. Mild diffuse trabeculated appearance of the bladder wall, likely related to chronic bladder outlet obstruction. Correlation with urinalysis recommended to exclude superimposed cystitis. Stomach/Bowel: There is thickened appearance of the rectal wall with mild perirectal edema which is suboptimally evaluated. A mildly enlarged and rounded left posterior perirectal lymph node (230/11) measures 11 mm. Although this may be inflammatory or infectious in etiology such as stercoral colitis, underlying malignancy is a concern. Correlation with clinical exam and direct visualization recommended. There is moderate stool throughout the colon. No bowel obstruction. The appendix is normal. Lymphatic: No retroperitoneal or mesenteric adenopathy. Left posterior perirectal mildly enlarged lymph node measuring 11 mm. Reproductive: Mild enlargement of the prostate gland. Other: None Musculoskeletal: Degenerative changes of the spine. No acute osseous pathology. Review of the MIP images confirms the above findings. IMPRESSION: 1. No CT evidence of pulmonary artery embolus or aortic dissection. 2. Right upper lobe mass as above. Findings may represent pneumonia but  concerning for malignancy. Clinical correlation and close follow-up to resolution or multi facility consult is advised. Please see above discussion. 3. Thickened rectal wall with left posterior perirectal mildly enlarged lymph nodes. Findings may be inflammatory/infectious but concerning for malignancy. Correlation with clinical exam and direct visualization recommended. 4. Mildly enlarged right hilar lymph node. 5. No bowel obstruction. Normal appendix. 6. Aortic Atherosclerosis (ICD10-I70.0). Electronically Signed   By: Anner Crete M.D.   On: 02/14/2022 23:18   DG Chest Port 1 View  Result Date: 02/14/2022 CLINICAL DATA:  Sepsis, weakness, diarrhea EXAM: PORTABLE CHEST 1 VIEW COMPARISON:  None Available. FINDINGS: Single frontal view of the chest demonstrates an unremarkable cardiac silhouette. Peripheral wedge-shaped consolidation within the right mid chest could reflect bronchopneumonia. No effusion or pneumothorax. No acute bony abnormalities. IMPRESSION: 1. Peripheral wedge-shaped consolidation within the lateral right mid chest, compatible with bronchopneumonia in the appropriate setting. Followup PA and lateral chest X-ray is recommended in 3-4 weeks following trial of antibiotic therapy to ensure resolution and exclude underlying malignancy. Electronically Signed   By: Randa Ngo M.D.   On: 02/14/2022 17:26        Scheduled Meds:  carbidopa-levodopa  1 tablet Oral 3 times per day   Chlorhexidine Gluconate Cloth  6 each Topical Daily   clonazePAM  0.5 mg Oral QHS   donepezil  10 mg  Oral QHS   enoxaparin (LOVENOX) injection  40 mg Subcutaneous Q24H   polyethylene glycol  17 g Oral Daily   QUEtiapine  25 mg Oral BID   senna-docusate  2 tablet Oral BID   tamsulosin  0.4 mg Oral QPC supper   Continuous Infusions:  cefTRIAXone (ROCEPHIN)  IV 1 g (02/15/22 2117)     LOS: 2 days     Domenic Polite, MD [facility]   02/16/2022, 11:25 AM

## 2022-02-16 NOTE — Progress Notes (Signed)
PT Cancellation Note  Patient Details Name: CASTLE LAMONS MRN: [id number] DOB: 22-Sep-1945   Cancelled Treatment:    Reason Eval/Treat Not Completed: Patient at procedure or test/unavailable   Kati L Payson 02/16/2022, 2:10 PM Arlyce Dice, DPT Physical Therapist [facility] Preferred contact method: [place] Weekend Pager Only: [phone] Office: [phone]

## 2022-02-16 NOTE — Progress Notes (Signed)
Pharmacy Antibiotic Note  Ryan Woodward is a 76 y.o. male admitted on 02/14/2022 with weakness and urinary retention.  CXR with concern for pneumonia - PCCM following and recommends Unasyn or Augmentin for possible pulmonary abscess.  Pharmacy has been consulted for Unasyn dosing to treat lung infection and UTI.  Plan: Unasyn 3g IV q6 hours  Dose adjustments likely not necessary at this time given stable renal function. Pharmacy will formally sign off consult but continue to monitor peripherally.   Height: '6\' 4"'$  (193 cm) Weight: 78 kg (172 lb) IBW/kg (Calculated) : 86.8  Temp (24hrs), Avg:98.9 F (37.2 C), Min:97.8 F (36.6 C), Max:100.3 F (37.9 C)  Recent Labs  Lab 02/14/22 1701 02/14/22 1935 02/14/22 2020 02/15/22 0730 02/15/22 0855  WBC 17.8*  --  18.3* 17.4*  --   CREATININE 0.85  --  0.81  --  0.77  LATICACIDVEN 1.4 0.8  --   --   --     Estimated Creatinine Clearance: 88 mL/min (by C-G formula based on SCr of 0.77 mg/dL).    No Known Allergies  Antimicrobials this admission: Azithromycin + Cefepime x1 on 7/16 Ceftriaxone 7/16 >> 7/17 Unasyn 7/18 >>  Dose adjustments this admission:  Microbiology results: 7/16 UCx: >100K pan-sensitive EColi    Thank you for allowing pharmacy to be a part of this patient's care.  Dimple Nanas, PharmD, BCPS 02/16/2022 12:30 PM

## 2022-02-16 NOTE — Progress Notes (Signed)
Modified Barium Swallow Progress Note  Patient Details  Name: Ryan Woodward MRN: [id number] Date of Birth: Jul 17, 1946  Today's Date: 02/16/2022  Modified Barium Swallow completed.  Full report located under Chart Review in the Imaging Section.  Brief recommendations include the following:  Clinical Impression  Pt demonstrates a mild pharyngeal dysphagia and suspected esophageal involvement marked by reduced tongue base retraction and pharyngeal contraction. Consecutive straw sips resulted in laryngeal penetration with thin barium (PAS 3) and with smaller sips there was one instance of penetration to vocal cords briefly (PAS 5). Vallecular and pyriform sinus residue ranged from min-moderate with pyriform sinus residue attempting to spill over interarytenoid space on several occasions. Pt senses residue and performs spontaneous swallows that help to reduce volumes. Pt managed to masticate cracker thoroughly and transit. Esophageal scan revealed barium pill stopped mid esophagus and not propelled to distal esophagus with applesauce then into GE junction with thin. Given his confusion, decreased endurance, recommend Dys 2 texture, thin liquids, straws allowed but must take small sips and person assisting to feed should remove straw after 1 or 2 sips. Crush meds, multiple swallows and intermittent throat clear. ST will contiue to follow.   Swallow Evaluation Recommendations       SLP Diet Recommendations: Thin liquid;Dysphagia 2 (Fine chop) solids   Liquid Administration via: Cup;Straw   Medication Administration: Crushed with puree   Supervision: Staff to assist with self feeding;Full supervision/cueing for compensatory strategies   Compensations: Slow rate;Small sips/bites;Clear throat intermittently;Multiple dry swallows after each bite/sip   Postural Changes: Seated upright at 90 degrees;Remain semi-upright after after feeds/meals (Comment)   Oral Care Recommendations: Oral care BID         Houston Siren 02/16/2022,3:58 PM

## 2022-02-16 NOTE — Progress Notes (Signed)
EKG done. Results handed to RN

## 2022-02-16 NOTE — Evaluation (Addendum)
Clinical/Bedside Swallow Evaluation Patient Details  Name: Ryan Woodward MRN: [id number] Date of Birth: 09-06-1945  Today's Date: 02/16/2022 Time: SLP Start Time (ACUTE ONLY): 0825 SLP Stop Time (ACUTE ONLY): 0848 SLP Time Calculation (min) (ACUTE ONLY): 23 min  Past Medical History:  Past Medical History:  Diagnosis Date   A-fib ([place])    isolated episode during colonoscopy   Allergies    Back pain    Bladder cancer ([place]) dx aug'12   s/p turbt   Enlarged prostate with urinary obstruction    nocturia/ freq/ dysuria   GERD (gastroesophageal reflux disease)    OSA on CPAP    wears CPAP nightly   PD (Parkinson's disease) ([place])    Skin cancer of nose 05/2018   basal cell   Past Surgical History:  Past Surgical History:  Procedure Laterality Date   CERVICAL FUSION  2000   C4-5   COLONOSCOPY     CYSTOSCOPY  06/16/2011   Procedure: CYSTOSCOPY;  Surgeon: Molli Hazard, MD;  Location: [facility];  Service: Urology;  Laterality: N/A;   CYSTOSCOPY WITH URETHRAL DILATATION  w/ TURBT BX   05-04-11   INGUINAL HERNIA REPAIR Bilateral 02/07/2019   Procedure: BILATERAL INGUINAL HERNIA REPAIR WITH MESH;  Surgeon: Donnie Mesa, MD;  Location: [facility];  Service: General;  Laterality: Bilateral;  GENERAL AND TAP BLOCK ANESTHESIA   INSERTION OF MESH Bilateral 02/07/2019   Procedure: INSERTION OF MESH;  Surgeon: Donnie Mesa, MD;  Location: [facility];  Service: General;  Laterality: Bilateral;   SINUS SURGERY     TRANSURETHRAL RESECTION OF PROSTATE  06/16/2011   Procedure: TRANSURETHRAL RESECTION OF THE PROSTATE (TURP);  Surgeon: Molli Hazard, MD;  Location: [facility];  Service: Urology;  Laterality: N/A;  URETHRAL DILATATION, MEATOTOMY, PENIL BLOCK   HPI:  76 yo man with medical history of parkinson's disease, GERD and dementia who presents weakness and confusion at home. Preliminary work up  concerning for sepsis  from UTI and PE study was negative for PE. Found to have RUL lung mass vs pulmonary abscess.    Assessment / Plan / Recommendation  Clinical Impression  Pt presents with indications of a pharyngeal dysphagia. Dentition is intact, cough is moderately strong and oromotor exam unremarkable. There were audible (wet) and multiple swallows. Laryngeal elevation appeared decreased and not fluid from observation. Immediate and delayed coughing wtih thin noted as well as delayed coughs with nectar thick liquids. Mastication slower and deliberate. Pt would benefit from MBS to fully evaluate ability to safely consume po textures. MBS scheduled for approximately 1:45 today. Recommend conservative diet of puree textures (diet not changed) and no liquids until MBS.  SLP Visit Diagnosis: Dysphagia, unspecified (R13.10)    Aspiration Risk  Mild aspiration risk;Moderate aspiration risk    Diet Recommendation Dysphagia 1 (Puree) (defer liquids until after MBS)   Liquid Administration via: Cup Medication Administration: Crushed with puree Supervision: Staff to assist with self feeding;Full supervision/cueing for compensatory strategies Compensations: Slow rate;Small sips/bites Postural Changes: Seated upright at 90 degrees;Remain upright for at least 30 minutes after po intake    Other  Recommendations Oral Care Recommendations: Oral care BID    Recommendations for follow up therapy are one component of a multi-disciplinary discharge planning process, led by the attending physician.  Recommendations may be updated based on patient status, additional functional criteria and insurance authorization.  Follow up Recommendations        Assistance Recommended  at Discharge    Functional Status Assessment    Frequency and Duration            Prognosis        Swallow Study   General Date of Onset: 02/14/22 HPI: 76 yo man with medical history of parkinson's disease, GERD and dementia who presents weakness  and confusion at home. Preliminary work up  concerning for sepsis from UTI and PE study was negative for PE. Found to have RUL lung mass vs pulmonary abscess. Type of Study: Bedside Swallow Evaluation Previous Swallow Assessment:  (none) Diet Prior to this Study: Regular;Thin liquids Temperature Spikes Noted: No Respiratory Status: Room air History of Recent Intubation: No Behavior/Cognition: Alert;Cooperative;Pleasant mood;Requires cueing;Confused Oral Cavity Assessment: Within Functional Limits Oral Care Completed by SLP: No Oral Cavity - Dentition: Adequate natural dentition Vision: Functional for self-feeding Self-Feeding Abilities: Needs assist Patient Positioning: Upright in bed Baseline Vocal Quality: Normal Volitional Cough: Strong Volitional Swallow: Able to elicit    Oral/Motor/Sensory Function Overall Oral Motor/Sensory Function: Within functional limits   Ice Chips Ice chips: Not tested   Thin Liquid Thin Liquid: Impaired Presentation: Cup;Straw Oral Phase Impairments: Reduced labial seal Oral Phase Functional Implications: Left anterior spillage;Right anterior spillage Pharyngeal  Phase Impairments: Cough - Immediate;Cough - Delayed    Nectar Thick Nectar Thick Liquid: Impaired Presentation: Cup;Straw Pharyngeal Phase Impairments: Cough - Immediate   Honey Thick Honey Thick Liquid: Not tested   Puree Puree: Within functional limits   Solid     Solid: Within functional limits      [place] Siren 02/16/2022,9:13 AM

## 2022-02-17 DIAGNOSIS — N3000 Acute cystitis without hematuria: Secondary | ICD-10-CM | POA: Diagnosis not present

## 2022-02-17 DIAGNOSIS — A419 Sepsis, unspecified organism: Secondary | ICD-10-CM | POA: Diagnosis not present

## 2022-02-17 DIAGNOSIS — J852 Abscess of lung without pneumonia: Secondary | ICD-10-CM | POA: Diagnosis not present

## 2022-02-17 LAB — BASIC METABOLIC PANEL
Anion gap: 8 (ref 5–15)
BUN: 12 mg/dL (ref 8–23)
CO2: 25 mmol/L (ref 22–32)
Calcium: 8.3 mg/dL — ABNORMAL LOW (ref 8.9–10.3)
Chloride: 102 mmol/L (ref 98–111)
Creatinine, Ser: 0.77 mg/dL (ref 0.61–1.24)
GFR, Estimated: >60 mL/min (ref >60)
Glucose, Bld: 102 mg/dL — ABNORMAL HIGH (ref 70–99)
Potassium: 3.4 mmol/L — ABNORMAL LOW (ref 3.5–5.1)
Sodium: 135 mmol/L (ref 135–145)

## 2022-02-17 LAB — CBC
HCT: 37.5 % — ABNORMAL LOW (ref 39.0–52.0)
Hemoglobin: 12.8 g/dL — ABNORMAL LOW (ref 13.0–17.0)
MCH: 30 pg (ref 26.0–34.0)
MCHC: 34.1 g/dL (ref 30.0–36.0)
MCV: 87.8 fL (ref 80.0–100.0)
Platelets: 220 K/uL (ref 150–400)
RBC: 4.27 MIL/uL (ref 4.22–5.81)
RDW: 11.9 % (ref 11.5–15.5)
WBC: 7.6 K/uL (ref 4.0–10.5)
nRBC: 0 % (ref 0.0–0.2)

## 2022-02-17 MED ORDER — POTASSIUM CHLORIDE CRYS ER 20 MEQ PO TBCR
40.0000 meq | EXTENDED_RELEASE_TABLET | Freq: Once | ORAL | Status: AC
  Administered 2022-02-17: 40 meq via ORAL
  Filled 2022-02-17: qty 2

## 2022-02-17 NOTE — Progress Notes (Addendum)
PROGRESS NOTE    Ryan Woodward  JHE:[id number] DOB: May 02, 1946 DOA: 02/14/2022 PCP: Lawerance Cruel, MD     Brief Narrative:  Ryan Woodward is a 76 y.o. male with history of Parkinson's, dementia, sleep disorder had come to the ER 3 days ago for urinary retention at the time had Foley catheter placed and discharged home.  As per patient's spouse over the last 3 days patient has become more weak and also has been in multiple episodes of watery diarrhea no vomiting but did complain of some abdominal discomfort. ED Course: Febrile -102.3 F , UA is consistent with UTI and chest x-ray shows which is hazy defect concerning for pneumonia.  On further work-up noted to have lung abscess as well (mass not excluded), pulmonary consulted, work-up notable for dysphagia -Hospitalization complicated by delirium.  Slowly improving.  Possible DC to SNF in 1 to 2 days.  Assessment & Plan:   Sepsis -Secondary to UTI and presumed lung abscess -Antibiotics changed to IV Unasyn, appreciate pulmonary input, recommended at least 2 weeks of antibiotics, transition to Augmentin at DC, followed by imaging in 2 weeks, may need longer antibiotic course thereafter As per PCCM discussion with patient's spouse, they do not want to proceed with any sort of invasive diagnostic evaluation, even if follow-up chest x-ray does not show clearing (i.e they do not wish eval such as bronchoscopy or CT-guided lung biopsy as he would not be a candidate for cancer treatment even if that is found given his decline over the last year-referred to PCCM consultation note from 7/17 for details). Clinically improving.  Defervesced.  Leukocytosis resolved.  Mild tachypnea present.  No hypoxia. Hopefully could transition to oral Augmentin at time of discharge.  Lung mass, likely abscess -See discussion above, appreciate pulmonary input -Needs follow-up imaging after antibiotic course is completed  Urinary retention -Continue Flomax, Foley  replaced 7/17 for recurrent retention -Follow-up with urology in 1 to 2 weeks. No significant constipation reported on recent CT.  UTI -Likely secondary to retention, urine culture growing pansensitive E. Coli -On IV Unasyn now which also covers his lung abscess  History of Parkinson's disease Parkinson's dementia Delirium -With ongoing cognitive decline, remains on Aricept and Seroquel -Intermittent agitation, discontinued PRN Ativan, may increase Seroquel as needed -Home regimen of low-dose p.m. Klonopin continued.  Patient has been getting this for sleep disorder. -PT OT eval, anticipate need for placement and palliative care eval in the near future OT recommends SNF.  PT input pending.  As per significant other, he is unable to manage patient needs at home and is interested in SNF for patient.  TOC consulted. Patient follows with Dr. Lavell Anchors, [facility] for his Parkinson's disease.  Dysphagia Speech therapy input appreciated, continue dysphagia 1 diet, fluids deferred until after MBS  Rectal thickening -Noted on imaging, history of chronic constipation, start Senokot twice daily, MiraLAX, partner reports remote colonoscopy over 5 to 10 years ago -Would be very difficult to pursue colonoscopy in the setting of dementia, cognitive decline and numerous other active problems at this time, conservative management unless he develops acute complications This can be addressed as outpatient by PCP.  History of paroxysmal A-fib -Not on anticoagulation at baseline, rate controlled currently in sinus rhythm.  Discontinued telemetry 7/19.  Hypokalemia: Replaced.  Follow BMP.  Magnesium on 2.3.   DVT prophylaxis: lovenox Code Status: DNR Family Communication: Discussed with partner at bedside 7/19 Dispo: Anticipate DC to SNF.  PT input pending.  TOC  consulted.  Antimicrobials:  Ceftriaxone/azithromycin-discontinued IV Unasyn.   Subjective: Patient poor historian.   Talks softly.  Denies complaints.  As per significant other at bedside, patient not really much better compared to admission.  He confirms that patient mental status has been declining over some time, better in the daytime but worse in the evening/sundowning.  He advises that patient does not wish to be on prolonged life support and if he has a cardiac arrest, advises that he should be "let go".  He also indicates that he cannot manage patient's needs at home and agreeable to SNF.  As per nursing, was agitated yesterday, pulling at Foley, attempting to hit out at staff requiring physical restraints.  Objective: Vitals:   02/16/22 1314 02/16/22 2044 02/17/22 0515 02/17/22 1322  BP: (!) 163/69 (!) 181/86 (!) 153/82 (!) 153/71  Pulse: 93 84 86 87  Resp: 16 (!) 21 (!) 22 18  Temp: 98.7 F (37.1 C) 99 F (37.2 C) 98.2 F (36.8 C) 98.8 F (37.1 C)  TempSrc: Oral Oral Oral Oral  SpO2:  96% 90% 93%  Weight:      Height:        Intake/Output Summary (Last 24 hours) at 02/17/2022 1439 Last data filed at 02/17/2022 7026 Gross per 24 hour  Intake 500 ml  Output 975 ml  Net -475 ml   Filed Weights   02/14/22 1700  Weight: 78 kg    Examination:  General exam: Elderly male, moderately built, chronically ill looking, frail and debilitated lying propped up in bed without distress. CVS: S1 and S2 heard, RRR.  No JVD or murmurs.  Telemetry personally reviewed: Sinus arrhythmia versus occasional A-fib with controlled ventricular rate.  At times artifacts and less likely NSVT. Lungs: Diminished breath sounds bilaterally with scattered occasional crackles but no wheezing or rhonchi.  No increased work of breathing. Abdomen: Soft, mildly distended, nontender, bowel sounds present GU: Foley catheter present without gross hematuria. Extremities: No edema.  Moving all limbs symmetrically.  Does have bilateral wrist restraints. CNS: Alert and oriented x2.  No focal neurological deficits. Psychiatry:  Comprehension and judgment impaired.  Affect flat.  Not agitated at this time.    Data Reviewed: I have personally reviewed following labs and imaging studies  CBC: Recent Labs  Lab 02/14/22 1701 02/14/22 2020 02/15/22 0730 02/17/22 0626  WBC 17.8* 18.3* 17.4* 7.6  NEUTROABS 15.3*  --  14.8*  --   HGB 13.4 12.1* 12.5* 12.8*  HCT 39.8 35.7* 36.4* 37.5*  MCV 90.9 89.5 89.9 87.8  PLT 203 208 199 378   Basic Metabolic Panel: Recent Labs  Lab 02/14/22 1701 02/14/22 2020 02/15/22 0855 02/16/22 0620 02/17/22 0626  NA 142  --  140  --  135  K 3.8  --  3.1*  --  3.4*  CL 107  --  108  --  102  CO2 24  --  25  --  25  GLUCOSE 115*  --  100*  --  102*  BUN 22  --  14  --  12  CREATININE 0.85 0.81 0.77  --  0.77  CALCIUM 8.6*  --  7.5*  --  8.3*  MG  --   --   --  2.3  --    GFR: Estimated Creatinine Clearance: 88 mL/min (by C-G formula based on SCr of 0.77 mg/dL). Liver Function Tests: Recent Labs  Lab 02/14/22 1701 02/15/22 0855  AST 24 24  ALT 25 22  ALKPHOS  53 48  BILITOT 0.7 0.9  PROT 6.6 5.2*  ALBUMIN 3.3* 2.6*    Coagulation Profile: Recent Labs  Lab 02/14/22 1701  INR 1.1   Urine analysis:    Component Value Date/Time   COLORURINE YELLOW 02/14/2022 1817   APPEARANCEUR HAZY (A) 02/14/2022 1817   LABSPEC 1.021 02/14/2022 1817   PHURINE 6.0 02/14/2022 1817   GLUCOSEU NEGATIVE 02/14/2022 1817   HGBUR MODERATE (A) 02/14/2022 1817   BILIRUBINUR NEGATIVE 02/14/2022 1817   KETONESUR 20 (A) 02/14/2022 1817   PROTEINUR 100 (A) 02/14/2022 1817   NITRITE POSITIVE (A) 02/14/2022 1817   LEUKOCYTESUR LARGE (A) 02/14/2022 1817    Recent Results (from the past 240 hour(s))  Blood Culture (routine x 2)     Status: None (Preliminary result)   Collection Time: 02/14/22  5:01 PM   Specimen: BLOOD  Result Value Ref Range Status   Specimen Description   Final    BLOOD RIGHT ANTECUBITAL Performed at [facility], [place] [address]  [place], [place] [address]    Special Requests   Final    BOTTLES DRAWN AEROBIC AND ANAEROBIC Blood Culture results may not be optimal due to an inadequate volume of blood received in culture bottles Performed at [place] [address], [place], [place] [address]    Culture   Final    NO GROWTH 3 DAYS Performed at [facility], [place] [address], [place], [place] [address]    Report Status PENDING  Incomplete  Blood Culture (routine x 2)     Status: None (Preliminary result)   Collection Time: 02/14/22  6:00 PM   Specimen: BLOOD  Result Value Ref Range Status   Specimen Description   Final    BLOOD BLOOD RIGHT FOREARM Performed at [facility] [address] [place], [place] [address]    Special Requests   Final    BOTTLES DRAWN AEROBIC AND ANAEROBIC Blood Culture results may not be optimal due to an inadequate volume of blood received in culture bottles Performed at [facility] [address] [place], [place] [address]    Culture   Final    NO GROWTH 2 DAYS Performed at [place] [address] [place], [place] [address]    Report Status PENDING  Incomplete  Urine Culture     Status: Abnormal   Collection Time: 02/14/22  6:17 PM   Specimen: In/Out Cath Urine  Result Value Ref Range Status   Specimen Description   Final    IN/OUT CATH URINE Performed at [place] [address] [place], [place] [address]    Special Requests   Final    NONE Performed at [facility], [place] [address], [place], [place] [address]    Culture >=100,000 COLONIES/mL ESCHERICHIA COLI (A)  Final   Report Status 02/16/2022 FINAL  Final   Organism ID, Bacteria ESCHERICHIA COLI (A)  Final      Susceptibility   Escherichia coli - MIC*    AMPICILLIN 8 SENSITIVE Sensitive     CEFAZOLIN <=4 SENSITIVE Sensitive     CEFEPIME <=0.12 SENSITIVE Sensitive     CEFTRIAXONE <=0.25 SENSITIVE  Sensitive     CIPROFLOXACIN <=0.25 SENSITIVE Sensitive     GENTAMICIN <=1 SENSITIVE Sensitive     IMIPENEM <=0.25 SENSITIVE Sensitive     NITROFURANTOIN <=16 SENSITIVE Sensitive     TRIMETH/SULFA <=20 SENSITIVE Sensitive     AMPICILLIN/SULBACTAM <=  2 SENSITIVE Sensitive     PIP/TAZO <=4 SENSITIVE Sensitive     * >=100,000 COLONIES/mL ESCHERICHIA COLI  Resp Panel by RT-PCR (Flu A&B, Covid) Anterior Nasal Swab     Status: None   Collection Time: 02/14/22  7:22 PM   Specimen: Anterior Nasal Swab  Result Value Ref Range Status   SARS Coronavirus 2 by RT PCR NEGATIVE NEGATIVE Final    Comment: (NOTE) SARS-CoV-2 target nucleic acids are NOT DETECTED.  The SARS-CoV-2 RNA is generally detectable in upper respiratory specimens during the acute phase of infection. The lowest concentration of SARS-CoV-2 viral copies this assay can detect is 138 copies/mL. A negative result does not preclude SARS-Cov-2 infection and should not be used as the sole basis for treatment or other patient management decisions. A negative result may occur with  improper specimen collection/handling, submission of specimen other than nasopharyngeal swab, presence of viral mutation(s) within the areas targeted by this assay, and inadequate number of viral copies(<138 copies/mL). A negative result must be combined with clinical observations, patient history, and epidemiological information. The expected result is Negative.  Fact Sheet for Patients:  EntrepreneurPulse.com.au  Fact Sheet for Healthcare Providers:  IncredibleEmployment.be  This test is no t yet approved or cleared by the [place] FDA and  has been authorized for detection and/or diagnosis of SARS-CoV-2 by FDA under an Emergency Use Authorization (EUA). This EUA will remain  in effect (meaning this test can be used) for the duration of the COVID-19 declaration under Section 564(b)(1) of the Act, 21 U.S.C.section  360bbb-3(b)(1), unless the authorization is terminated  or revoked sooner.       Influenza A by PCR NEGATIVE NEGATIVE Final   Influenza B by PCR NEGATIVE NEGATIVE Final    Comment: (NOTE) The Xpert Xpress SARS-CoV-2/FLU/RSV plus assay is intended as an aid in the diagnosis of influenza from Nasopharyngeal swab specimens and should not be used as a sole basis for treatment. Nasal washings and aspirates are unacceptable for Xpert Xpress SARS-CoV-2/FLU/RSV testing.  Fact Sheet for Patients: EntrepreneurPulse.com.au  Fact Sheet for Healthcare Providers: IncredibleEmployment.be  This test is not yet approved or cleared by the [place] FDA and has been authorized for detection and/or diagnosis of SARS-CoV-2 by FDA under an Emergency Use Authorization (EUA). This EUA will remain in effect (meaning this test can be used) for the duration of the COVID-19 declaration under Section 564(b)(1) of the Act, 21 U.S.C. section 360bbb-3(b)(1), unless the authorization is terminated or revoked.  Performed at [facility], [place] [address], [place], [place] [address]          Radiology Studies: DG Swallowing Func-Speech Pathology  Result Date: 02/16/2022 Table formatting from the original result was not included. Objective Swallowing Evaluation: Type of Study: Bedside Swallow Evaluation  Patient Details Name: PLES TRUDEL MRN: [id number] Date of Birth: 12/05/1945 Today's Date: 02/16/2022 Time: SLP Start Time (ACUTE ONLY): 1410 -SLP Stop Time (ACUTE ONLY): 1430 SLP Time Calculation (min) (ACUTE ONLY): 20 min Past Medical History: Past Medical History: Diagnosis Date  A-fib ([place])   isolated episode during colonoscopy  Allergies   Back pain   Bladder cancer ([place]) dx aug'12  s/p turbt  Enlarged prostate with urinary obstruction   nocturia/ freq/ dysuria  GERD (gastroesophageal reflux disease)   OSA on CPAP   wears CPAP nightly  PD (Parkinson's  disease) ([place])   Skin cancer of nose 05/2018  basal cell Past Surgical History: Past Surgical History: Procedure Laterality Date  CERVICAL FUSION  2000  C4-5  COLONOSCOPY    CYSTOSCOPY  06/16/2011  Procedure: CYSTOSCOPY;  Surgeon: Molli Hazard, MD;  Location: [facility];  Service: Urology;  Laterality: N/A;  CYSTOSCOPY WITH URETHRAL DILATATION  w/ TURBT BX   05-04-11  INGUINAL HERNIA REPAIR Bilateral 02/07/2019  Procedure: BILATERAL INGUINAL HERNIA REPAIR WITH MESH;  Surgeon: Donnie Mesa, MD;  Location: [facility];  Service: General;  Laterality: Bilateral;  GENERAL AND TAP BLOCK ANESTHESIA  INSERTION OF MESH Bilateral 02/07/2019  Procedure: INSERTION OF MESH;  Surgeon: Donnie Mesa, MD;  Location: [facility];  Service: General;  Laterality: Bilateral;  SINUS SURGERY    TRANSURETHRAL RESECTION OF PROSTATE  06/16/2011  Procedure: TRANSURETHRAL RESECTION OF THE PROSTATE (TURP);  Surgeon: Molli Hazard, MD;  Location: [facility];  Service: Urology;  Laterality: N/A;  URETHRAL DILATATION, MEATOTOMY, PENIL BLOCK HPI: 76 yo man with medical history of parkinson's disease, GERD, dementia, ACDF who presents weakness and confusion at home. Preliminary work up  concerning for sepsis from UTI and PE study was negative for PE. Found to have RUL lung mass vs pulmonary abscess.  No data recorded  Recommendations for follow up therapy are one component of a multi-disciplinary discharge planning process, led by the attending physician.  Recommendations may be updated based on patient status, additional functional criteria and insurance authorization. Assessment / Plan / Recommendation   02/16/2022   3:33 PM Clinical Impressions Clinical Impression Pt demonstrates a mild pharyngeal dysphagia and suspected esophageal involvement marked by reduced tongue base retraction and pharyngeal contraction. Consecutive straw sips resulted in laryngeal penetration  with thin barium (PAS 3) and with smaller sips there was one instance of penetration to vocal cords briefly (PAS 5). Vallecular and pyriform sinus residue ranged from min-moderate with pyriform sinus residue attempting to spill over interarytenoid space on several occasions. Pt senses residue and performs spontaneous swallows that help to reduce volumes. Pt managed to masticate cracker thoroughly and transit. Esophageal scan revealed barium pill stopped mid esophagus and not propelled to distal esophagus with applesauce then into GE junction with thin. Given his confusion, decreased endurance, recommend Dys 2 texture, thin liquids, straws allowed but must take small sips and person assisting to feed should remove straw after 1 or 2 sips. Crush meds, multiple swallows and intermittent throat clear. ST will contiue to follow. SLP Visit Diagnosis Dysphagia, pharyngeal phase (R13.13) Impact on safety and function Mild aspiration risk;Moderate aspiration risk     02/16/2022   3:33 PM Treatment Recommendations Treatment Recommendations Therapy as outlined in treatment plan below     02/16/2022   3:33 PM Prognosis Prognosis for Safe Diet Advancement Good Barriers to Reach Goals Cognitive deficits   02/16/2022   3:33 PM Diet Recommendations SLP Diet Recommendations Thin liquid;Dysphagia 2 (Fine chop) solids Liquid Administration via Cup;Straw Medication Administration Crushed with puree Compensations Slow rate;Small sips/bites;Clear throat intermittently;Multiple dry swallows after each bite/sip Postural Changes Seated upright at 90 degrees;Remain semi-upright after after feeds/meals (Comment)     02/16/2022   3:33 PM Other Recommendations Oral Care Recommendations Oral care BID Follow Up Recommendations -- Assistance recommended at discharge Frequent or constant Supervision/Assistance Functional Status Assessment Patient has had a recent decline in their functional status and demonstrates the ability to make significant  improvements in function in a reasonable and predictable amount of time.   02/16/2022   3:33 PM Frequency and Duration  Speech Therapy Frequency (ACUTE ONLY) min 2x/week Treatment  Duration 2 weeks     02/16/2022   3:33 PM Oral Phase Oral Phase [facility]    02/16/2022   3:33 PM Pharyngeal Phase Pharyngeal Phase Impaired Pharyngeal- Thin Teaspoon Pharyngeal residue - valleculae;Pharyngeal residue - pyriform Pharyngeal- Thin Cup Pharyngeal residue - valleculae;Pharyngeal residue - pyriform Pharyngeal- Thin Straw Penetration/Aspiration during swallow;Pharyngeal residue - pyriform;Pharyngeal residue - valleculae Pharyngeal Material enters airway, remains ABOVE vocal cords then ejected out;Material enters airway, remains ABOVE vocal cords and not ejected out;Material enters airway, CONTACTS cords and not ejected out Pharyngeal- Puree Pharyngeal residue - valleculae;Reduced pharyngeal peristalsis Pharyngeal- Regular Pharyngeal residue - valleculae;Reduced tongue base retraction    02/16/2022   3:33 PM Cervical Esophageal Phase  Cervical Esophageal Phase [facility] [place] Siren 02/16/2022, 3:57 PM                          Scheduled Meds:  carbidopa-levodopa  1 tablet Oral 3 times per day   Chlorhexidine Gluconate Cloth  6 each Topical Daily   clonazePAM  0.5 mg Oral QHS   donepezil  10 mg Oral QHS   enoxaparin (LOVENOX) injection  40 mg Subcutaneous Q24H   polyethylene glycol  17 g Oral Daily   QUEtiapine  25 mg Oral BID   senna-docusate  2 tablet Oral BID   tamsulosin  0.4 mg Oral QPC supper   traZODone  50 mg Oral QPM   Continuous Infusions:  ampicillin-sulbactam (UNASYN) IV 3 g (02/17/22 1331)     LOS: 3 days    Vernell Leep, MD,  FACP, [facility], [facility], [facility] (Care Management Physician Certified) [place]  To contact the attending provider between 7A-7P or the covering provider during after hours 7P-7A, please log into the web site [email or web address] and access using  universal [place] password for that web site. If you do not have the password, please call the hospital operator.   02/17/2022, 2:39 PM

## 2022-02-17 NOTE — TOC Progression Note (Signed)
Transition of Care [facility]) - Progression Note    Patient Details  Name: Ryan Woodward MRN: [id number] Date of Birth: [birth year]  [facility] of [facility]) CM/SW Contact  Joaquin Courts, RN Phone Number: 02/17/2022, 2:28 PM  Clinical Narrative:    CM noted new TOC consult, reached out to patient's spouse who reports that he is interested in snf for short term rehab.  CM discussed the SNF placement process, highlighted that PT recommendations are pending at this time.  Discussed need for insurance authorization as well.  TOC will continue to follow for discharge needs.   Expected Discharge Plan: Home/Self Care Barriers to Discharge: Continued Medical Work up  Expected Discharge Plan and Services Expected Discharge Plan: Home/Self Care       Living arrangements for the past 2 months: Single Family Home                                       Social Determinants of Health (SDOH) Interventions    Readmission Risk Interventions     No data to display

## 2022-02-17 NOTE — Evaluation (Signed)
Occupational Therapy Evaluation Patient Details Name: Ryan Woodward MRN: [id number] DOB: 09-20-1945 Today's Date: 02/17/2022   History of Present Illness Ryan Woodward is a 76 y.o. male with history of Parkinson's, dementia, sleep disorder had come to the ER 3 days ago for urinary retention at the time had Foley catheter placed and discharged home.  As per patient's spouse over the last 3 days patient has become Woodward weak and also has been in multiple episodes of watery diarrhea no vomiting but did complain of some abdominal discomfort.  UA is consistent with UTI and chest x-ray shows which is hazy defect concerning for pneumonia.  On further work-up noted to have lung abscess as well, pulmonary consulted, work-up notable for dysphagia  -Hospitalization complicated by delirium. Patient admitted for Sepsis  -Secondary to UTI and lung abscess   Clinical Impression   Ryan Woodward is a 76 year old man who presents with above medical history and presents with generalized weakness, decreased activity tolerance, impaired balance and cognitive impairments. Patient needing min-mod assist to ambulate with walker and increased assistance with ADLs including max-total assist for LB ADLs and toileting. Patient will benefit from skilled OT services while in hospital to improve deficits and learn compensatory strategies as needed in order to return to PLOF.  Recommend short term rehab at discharge.        Recommendations for follow up therapy are one component of a multi-disciplinary discharge planning process, led by the attending physician.  Recommendations may be updated based on patient status, additional functional criteria and insurance authorization.   Follow Up Recommendations  Skilled nursing-short term rehab (<3 hours/day)    Assistance Recommended at Discharge Frequent or constant Supervision/Assistance  Patient can return home with the following A lot of help with walking and/or transfers;A lot of  help with bathing/dressing/bathroom;Assistance with cooking/housework;Direct supervision/assist for financial management;Help with stairs or ramp for entrance;Direct supervision/assist for medications management;Assist for transportation    Functional Status Assessment  Patient has had a recent decline in their functional status and demonstrates the ability to make significant improvements in function in a reasonable and predictable amount of time.  Equipment Recommendations   (TBD)    Recommendations for Other Services       Precautions / Restrictions Precautions Precautions: Fall Precaution Comments: safety mittents and restraints Restrictions Weight Bearing Restrictions: No      Mobility Bed Mobility Overal bed mobility: Needs Assistance Bed Mobility: Supine to Sit, Sit to Supine     Supine to sit: Max assist, HOB elevated Sit to supine: Min assist        Transfers Overall transfer level: Needs assistance Equipment used: Rolling walker (2 wheels) Transfers: Sit to/from Stand, Bed to chair/wheelchair/BSC Sit to Stand: Min assist, From elevated surface     Step pivot transfers: Min assist, Mod assist     General transfer comment: Min assis to stand from elevated bed height. Min assist to ambulate to maintain balance. Needed mod assist as he continued to walk due to Woodward imbalance and walker management.      Balance Overall balance assessment: Needs assistance Sitting-balance support: No upper extremity supported, Feet supported Sitting balance-Leahy Scale: Fair     Standing balance support: During functional activity, Reliant on assistive device for balance Standing balance-Leahy Scale: Poor                             ADL either performed or assessed with  clinical judgement   ADL Overall ADL's : Needs assistance/impaired Eating/Feeding: Total assistance Eating/Feeding Details (indicate cue type and reason): Total assist for feeding to maintain  feeding strategies per speech Grooming: Set up;Supervision/safety;Wash/dry face Grooming Details (indicate cue type and reason): able to wash his face - supervision for quality Upper Body Bathing: Moderate assistance;Sitting   Lower Body Bathing: Maximal assistance;Sit to/from stand;Cueing for sequencing   Upper Body Dressing : Moderate assistance;Sitting;Cueing for sequencing   Lower Body Dressing: Maximal assistance;Sit to/from stand;Cueing for sequencing   Toilet Transfer: Minimal assistance;Comfort height toilet;Rolling walker (2 wheels);Grab bars;Cueing for sequencing   Toileting- Clothing Manipulation and Hygiene: Total assistance;Sit to/from stand       Functional mobility during ADLs: Minimal assistance;Moderate assistance       Vision   Vision Assessment?: No apparent visual deficits     Perception     Praxis      Pertinent Vitals/Pain Pain Assessment Pain Assessment: No/denies pain     Hand Dominance     Extremity/Trunk Assessment Upper Extremity Assessment Upper Extremity Assessment: RUE deficits/detail;LUE deficits/detail;Generalized weakness RUE Deficits / Details: WFL ROM, atleast 3+/5 strength LUE Deficits / Details: WFL ROM, atleast 3+/5 strength   Lower Extremity Assessment Lower Extremity Assessment: Defer to PT evaluation   Cervical / Trunk Assessment Cervical / Trunk Assessment: Normal   Communication Communication Communication: No difficulties   Cognition Arousal/Alertness: Awake/alert Behavior During Therapy: WFL for tasks assessed/performed Overall Cognitive Status: History of cognitive impairments - at baseline                                       General Comments       Exercises     Shoulder Instructions      Home Living Family/patient expects to be discharged to:: Skilled nursing facility Living Arrangements: Spouse/significant other                               Additional Comments: PLOF  unknown. Patient poor historian and no family.      Prior Functioning/Environment                          OT Problem List: Decreased strength;Decreased activity tolerance;Impaired balance (sitting and/or standing);Decreased cognition;Decreased safety awareness;Decreased knowledge of use of DME or AE      OT Treatment/Interventions: Self-care/ADL training;DME and/or AE instruction;Therapeutic activities;Balance training;Patient/family education;Cognitive remediation/compensation;Therapeutic exercise    OT Goals(Current goals can be found in the care plan section) Acute Rehab OT Goals OT Goal Formulation: Patient unable to participate in goal setting Time For Goal Achievement: 03/10/22 Potential to Achieve Goals: Good  OT Frequency: Min 2X/week    Co-evaluation              AM-PAC OT "6 Clicks" Daily Activity     Outcome Measure Help from another person eating meals?: Total Help from another person taking care of personal grooming?: A Little Help from another person toileting, which includes using toliet, bedpan, or urinal?: Total Help from another person bathing (including washing, rinsing, drying)?: A Lot Help from another person to put on and taking off regular upper body clothing?: A Lot Help from another person to put on and taking off regular lower body clothing?: A Lot 6 Click Score: 11   End of Session Equipment Utilized During Treatment: Rolling  walker (2 wheels);Gait belt Nurse Communication: Mobility status  Activity Tolerance: Patient tolerated treatment well Patient left: in bed;with call bell/phone within reach;with nursing/sitter in room  OT Visit Diagnosis: Unsteadiness on feet (R26.81);Muscle weakness (generalized) (M62.81);Other symptoms and signs involving cognitive function                Time: 1947-1252 OT Time Calculation (min): 18 min Charges:  OT General Charges $OT Visit: 1 Visit OT Evaluation $OT Eval Low Complexity: 1 Low  Eldora Napp,  OTR/L [place]  Office [phone] Pager: [place] 02/17/2022, 10:58 AM

## 2022-02-17 NOTE — Care Management Important Message (Signed)
Important Message  Patient Details IM Letter placed in Patients room. Name: Ryan Woodward MRN: [id number] Date of Birth: 05/25/1946   Medicare Important Message Given:  Yes     [name] 02/17/2022, 12:18 PM

## 2022-02-17 NOTE — Plan of Care (Signed)
Pt remains restless but agreeable.  No issues with combativeness this shift.  Problem: Nutrition: Goal: Adequate nutrition will be maintained Outcome: Not Progressing   Problem: Elimination: Goal: Will not experience complications related to bowel motility Outcome: Not Progressing   Problem: Pain Managment: Goal: General experience of comfort will improve Outcome: Progressing   Problem: Safety: Goal: Ability to remain free from injury will improve Outcome: Progressing   Problem: Safety: Goal: Non-violent Restraint(s) Outcome: Progressing

## 2022-02-17 NOTE — Evaluation (Signed)
Physical Therapy Evaluation Patient Details Name: Ryan Woodward MRN: [id number] DOB: [birth year] Today's Date: 02/17/2022  History of Present Illness  76 y.o. male with history of Parkinson's, dementia, sleep disorder had come to the ER 3 days ago for urinary retention at the time had Foley catheter placed and discharged home.  As per patient's spouse over the last 3 days patient has become more weak and also has been in multiple episodes of watery diarrhea no vomiting but did complain of some abdominal discomfort.  UA is consistent with UTI and chest x-ray shows which is hazy defect concerning for pneumonia.  On further work-up noted to have lung abscess as well, pulmonary consulted, work-up notable for dysphagia  -Hospitalization complicated by delirium. Patient admitted for Sepsis  -Secondary to UTI and lung abscess  Clinical Impression  On eval, pt required Mod-Max A for mobility. He was able to stand and take a few side steps along the bedside with a RW. He is very unsteady and at risk for falls. He did follow commands with increased time. He was very cooperative. PT recommendation is for ST SNF at this time. Will continue to follow pt.    Recommendations for follow up therapy are one component of a multi-disciplinary discharge planning process, led by the attending physician.  Recommendations may be updated based on patient status, additional functional criteria and insurance authorization.  Follow Up Recommendations Skilled nursing-short term rehab (<3 hours/day) Can patient physically be transported by private vehicle: No    Assistance Recommended at Discharge Frequent or constant Supervision/Assistance  Patient can return home with the following  Two people to help with walking and/or transfers;A lot of help with bathing/dressing/bathroom;Help with stairs or ramp for entrance;Assist for transportation;Assistance with cooking/housework    Equipment Recommendations  (continuing to assess/may  be determined at next venue)  Recommendations for Other Services       Functional Status Assessment Patient has had a recent decline in their functional status and demonstrates the ability to make significant improvements in function in a reasonable and predictable amount of time.     Precautions / Restrictions Precautions Precautions: Fall Precaution Comments: safety mittens and wrist restraints Restrictions Weight Bearing Restrictions: No      Mobility  Bed Mobility Overal bed mobility: Needs Assistance Bed Mobility: Supine to Sit, Sit to Supine     Supine to sit: Max assist Sit to supine: Mod assist   General bed mobility comments: Assist for trunk and bil LEs. Utilized bedpad for scooting, positioning. Multimodal cueing and increased time required but pt does initiate and follow commands.    Transfers Overall transfer level: Needs assistance Equipment used: Rolling walker (2 wheels) Transfers: Sit to/from Stand Sit to Stand: Mod assist, From elevated surface           General transfer comment: Assist to power up, stabilize, control descent. Multimodal cueing required.    Ambulation/Gait               General Gait Details: Side steps along bedside wth RW. Assist to stabilize/steady pt and manage RW.  Stairs            Wheelchair Mobility    Modified Rankin (Stroke Patients Only)       Balance Overall balance assessment: Needs assistance         Standing balance support: During functional activity, Reliant on assistive device for balance Standing balance-Leahy Scale: Poor  Pertinent Vitals/Pain Pain Assessment Pain Assessment: Faces Faces Pain Scale: No hurt    Home Living Family/patient expects to be discharged to:: Skilled nursing facility                        Prior Function                       Hand Dominance        Extremity/Trunk Assessment   Upper Extremity  Assessment Upper Extremity Assessment: Defer to OT evaluation    Lower Extremity Assessment Lower Extremity Assessment: Generalized weakness       Communication   Communication: No difficulties (sometimes mumbles)  Cognition Arousal/Alertness: Awake/alert Behavior During Therapy: WFL for tasks assessed/performed Overall Cognitive Status: History of cognitive impairments - at baseline                                          General Comments      Exercises     Assessment/Plan    PT Assessment Patient needs continued PT services  PT Problem List Decreased strength;Decreased balance;Decreased cognition;Decreased mobility;Decreased activity tolerance;Decreased knowledge of use of DME;Decreased safety awareness       PT Treatment Interventions DME instruction;Gait training;Functional mobility training;Therapeutic activities;Balance training;Therapeutic exercise;Patient/family education    PT Goals (Current goals can be found in the Care Plan section)  Acute Rehab PT Goals Patient Stated Goal: none stated PT Goal Formulation: Patient unable to participate in goal setting Time For Goal Achievement: 03/03/22 Potential to Achieve Goals: Good    Frequency Min 2X/week     Co-evaluation               AM-PAC PT "6 Clicks" Mobility  Outcome Measure Help needed turning from your back to your side while in a flat bed without using bedrails?: A Lot Help needed moving from lying on your back to sitting on the side of a flat bed without using bedrails?: A Lot Help needed moving to and from a bed to a chair (including a wheelchair)?: A Lot Help needed standing up from a chair using your arms (e.g., wheelchair or bedside chair)?: A Lot Help needed to walk in hospital room?: A Lot Help needed climbing 3-5 steps with a railing? : Total 6 Click Score: 11    End of Session Equipment Utilized During Treatment: Gait belt Activity Tolerance: Patient tolerated  treatment well Patient left: in bed;with call bell/phone within reach;with bed alarm set   PT Visit Diagnosis: Muscle weakness (generalized) (M62.81);Difficulty in walking, not elsewhere classified (R26.2)    Time: 6015-6153 PT Time Calculation (min) (ACUTE ONLY): 14 min   Charges:   PT Evaluation $PT Eval Moderate Complexity: 1 Mod            Doreatha Massed, PT Acute Rehabilitation  Office: [phone] Pager: [phone]

## 2022-02-17 NOTE — Progress Notes (Signed)
Speech Language Pathology Treatment: Dysphagia  Patient Details Name: Ryan Woodward MRN: [id number] DOB: 1946-07-05 Today's Date: 02/17/2022 Time: 9476-5465 SLP Time Calculation (min) (ACUTE ONLY): 46 min  Assessment / Plan / Recommendation Clinical Impression  Skilled SLP follow up indicatd to assess po tolerance, review MBS with pt and his family.  Cletus Gash, spouse, present as well as pt's friend.  Cletus Gash reports pt with less intake than normal PTA and had some coughing.  His friend was present and reports pt is more coherent today than he was 3 weeks ago when he saw them.  Follow up re: swallowing function indicated.  NT reports better intake at breakfast and NO po at lunch time as he turned his head away frequently.pt reportedly does not use straw at home and thus recommend not to use in the hospital.   Pt sleepy as spouse reports he only slept an hour last night.  He was wiling to consume [employer] and fed himself.    Pt did not form adequate suctionon straw to propel bolus into oral cavity.  Multiple swallows noted across all boluses with delayed cough x1 after approx 2 ounces if intake.  Cue to cough strongly was helpful- but also ? if pt may have some subtle minimal aspiration.  Marland Kitchen Spouse report he does not recall if pt conducts multiple swallows with each bite/sip.  Dys2 diet ordered but food he received is not minced.  Using teach back, reviewed flouro loops of study and advised pt to strenthen cough, voice and expectoration "hock".  Pt able to phonate single words loudly with moderate verba/visual cues.   Throat clearing at baseline noted and spouse reports pt has chronic nasal dripping.  Recommend follow up SLP at SNF to strengthen voice, cough and swallowing muculature to decrease aspiration and dysphagia risk.  Advised that maximizing liquid nutrition may be helpful as it will be more efficient for pt to swallow.   HPI HPI: 76 yo man with medical history of parkinson's disease, GERD, dementia,  ACDF who presents weakness and confusion at home. Preliminary work up  concerning for sepsis from UTI and PE study was negative for PE. Found to have RUL lung mass vs pulmonary abscess.      SLP Plan  Continue with current plan of care      Recommendations for follow up therapy are one component of a multi-disciplinary discharge planning process, led by the attending physician.  Recommendations may be updated based on patient status, additional functional criteria and insurance authorization.    Recommendations  Diet recommendations: Dysphagia 2 (fine chop);Thin liquid (maximize liquid nutrition) Compensations: Slow rate;Small sips/bites;Clear throat intermittently;Multiple dry swallows after each bite/sip Postural Changes and/or Swallow Maneuvers: Seated upright 90 degrees;Out of bed for meals                Oral Care Recommendations: Oral care BID Assistance recommended at discharge: Frequent or constant Supervision/Assistance SLP Visit Diagnosis: Dysphagia, pharyngeal phase (R13.13) Plan: Continue with current plan of care         Kathleen Lime, MS [place] Office [phone] Pager [phone]   [name]  02/17/2022, 4:20 PM

## 2022-02-18 DIAGNOSIS — A419 Sepsis, unspecified organism: Secondary | ICD-10-CM | POA: Diagnosis not present

## 2022-02-18 DIAGNOSIS — J189 Pneumonia, unspecified organism: Secondary | ICD-10-CM | POA: Diagnosis not present

## 2022-02-18 DIAGNOSIS — Z66 Do not resuscitate: Secondary | ICD-10-CM | POA: Diagnosis not present

## 2022-02-18 LAB — BASIC METABOLIC PANEL
Anion gap: 10 (ref 5–15)
BUN: 17 mg/dL (ref 8–23)
CO2: 26 mmol/L (ref 22–32)
Calcium: 8.8 mg/dL — ABNORMAL LOW (ref 8.9–10.3)
Chloride: 106 mmol/L (ref 98–111)
Creatinine, Ser: 0.78 mg/dL (ref 0.61–1.24)
GFR, Estimated: >60 mL/min (ref >60)
Glucose, Bld: 107 mg/dL — ABNORMAL HIGH (ref 70–99)
Potassium: 4.1 mmol/L (ref 3.5–5.1)
Sodium: 142 mmol/L (ref 135–145)

## 2022-02-18 LAB — CBC
HCT: 43.4 % (ref 39.0–52.0)
Hemoglobin: 14.5 g/dL (ref 13.0–17.0)
MCH: 29.7 pg (ref 26.0–34.0)
MCHC: 33.4 g/dL (ref 30.0–36.0)
MCV: 88.9 fL (ref 80.0–100.0)
Platelets: 232 10*3/uL (ref 150–400)
RBC: 4.88 MIL/uL (ref 4.22–5.81)
RDW: 12.2 % (ref 11.5–15.5)
WBC: 10.5 10*3/uL (ref 4.0–10.5)
nRBC: 0 % (ref 0.0–0.2)

## 2022-02-18 LAB — MAGNESIUM: Magnesium: 2.2 mg/dL (ref 1.7–2.4)

## 2022-02-18 MED ORDER — AMOXICILLIN-POT CLAVULANATE 875-125 MG PO TABS
1.0000 | ORAL_TABLET | Freq: Two times a day (BID) | ORAL | Status: DC
  Administered 2022-02-18 – 2022-02-20 (×6): 1 via ORAL
  Filled 2022-02-18 (×6): qty 1

## 2022-02-18 MED ORDER — CLONAZEPAM 0.5 MG PO TABS: 0.5000 mg | ORAL_TABLET | Freq: Every day | ORAL | 0 refills | Status: DC

## 2022-02-18 MED ORDER — AMOXICILLIN-POT CLAVULANATE 875-125 MG PO TABS: 1.0000 | ORAL_TABLET | Freq: Two times a day (BID) | ORAL | Status: DC

## 2022-02-18 MED ORDER — HALOPERIDOL LACTATE 5 MG/ML IJ SOLN
1.0000 mg | Freq: Four times a day (QID) | INTRAMUSCULAR | Status: DC | PRN
Start: 2022-02-18 — End: 2022-02-21
  Administered 2022-02-18 – 2022-02-19 (×2): 1 mg via INTRAMUSCULAR
  Filled 2022-02-18 (×3): qty 1

## 2022-02-18 MED ORDER — SENNOSIDES-DOCUSATE SODIUM 8.6-50 MG PO TABS: 2.0000 | ORAL_TABLET | Freq: Two times a day (BID) | ORAL | Status: DC

## 2022-02-18 MED ORDER — OLANZAPINE 5 MG PO TBDP
5.0000 mg | ORAL_TABLET | Freq: Two times a day (BID) | ORAL | Status: DC
  Administered 2022-02-18 – 2022-02-20 (×5): 5 mg via ORAL
  Filled 2022-02-18 (×7): qty 1

## 2022-02-18 MED ORDER — TRAZODONE HCL 50 MG PO TABS: 50.0000 mg | ORAL_TABLET | Freq: Every evening | ORAL | Status: DC

## 2022-02-18 MED ORDER — TAMSULOSIN HCL 0.4 MG PO CAPS: 0.4000 mg | ORAL_CAPSULE | Freq: Every day | ORAL | Status: DC

## 2022-02-18 NOTE — Progress Notes (Signed)
PROGRESS NOTE    Ryan Woodward  AOZ:[id number] DOB: 07-21-1946 DOA: 02/14/2022 PCP: Lawerance Cruel, MD     Brief Narrative:  Ryan Woodward is a 76 y.o. male with history of Parkinson's, dementia, sleep disorder had come to the ER 3 days ago for urinary retention at the time had Foley catheter placed and discharged home.  As per patient's spouse over the last 3 days patient has become more weak and also has been in multiple episodes of watery diarrhea no vomiting but did complain of some abdominal discomfort. ED Course: Febrile -102.3 F , UA is consistent with UTI and chest x-ray shows which is hazy defect concerning for pneumonia.  On further work-up noted to have lung abscess as well (mass not excluded), pulmonary consulted, work-up notable for dysphagia -Hospitalization complicated by delirium.  Slowly improving.  Possible DC to SNF in AM  Assessment & Plan:   Sepsis -Secondary to UTI and presumed lung abscess -Antibiotics changed to IV Unasyn, appreciate pulmonary input, recommended at least 2 weeks of antibiotics, transition to Augmentin at DC, followed by imaging in 2 weeks, may need longer antibiotic course thereafter As per PCCM discussion with patient's spouse, they do not want to proceed with any sort of invasive diagnostic evaluation, even if follow-up chest x-ray does not show clearing (i.e they do not wish eval such as bronchoscopy or CT-guided lung biopsy as he would not be a candidate for cancer treatment even if that is found given his decline over the last year-referred to PCCM consultation note from 7/17 for details). Clinically improving.  Defervesced.  Leukocytosis resolved.  Mild tachypnea present.  No hypoxia. -change to augmentin  Lung mass, likely abscess -See discussion above, appreciate pulmonary input -Needs follow-up imaging after antibiotic course is completed  Urinary retention -Continue Flomax, Foley replaced 7/17 for recurrent retention -Follow-up with  urology in 1 to 2 weeks. No significant constipation reported on recent CT.  UTI -Likely secondary to retention, urine culture growing pansensitive E. Coli -augmentin  History of Parkinson's disease Parkinson's dementia Delirium -With ongoing cognitive decline, remains on Aricept and Seroquel -calm today -Home regimen of low-dose p.m. Klonopin continued.  Patient has been getting this for sleep disorder. -PT OT eval, anticipate need for placement - SNF for patient.  TOC consulted. Patient follows with Dr. Jaynee Eagles, [facility] for his Parkinson's disease.  Dysphagia Speech therapy input appreciated, continue dysphagia 2 diet, fluids deferred until after MBS  Rectal thickening -Noted on imaging, history of chronic constipation, start Senokot twice daily, MiraLAX, partner reports remote colonoscopy over 5 to 10 years ago -Would be very difficult to pursue colonoscopy in the setting of dementia, cognitive decline and numerous other active problems at this time, conservative management unless he develops acute complications This can be addressed as outpatient by PCP.  History of paroxysmal A-fib -Not on anticoagulation at baseline, rate controlled currently in sinus rhythm.  Discontinued telemetry 7/19.  Hypokalemia: Replaced.  Follow BMP.  Magnesium on 2.3.   DVT prophylaxis: lovenox Code Status: DNR Dispo: Anticipate DC to SNF in AM     Subjective: No overnight events, sleeping this AM but when woken up says he is tired  Objective: Vitals:   02/17/22 0515 02/17/22 1322 02/17/22 2018 02/18/22 0626  BP: (!) 153/82 (!) 153/71 (!) 145/78 95/67  Pulse: 86 87 82 73  Resp: (!) '22 18 18   '$ Temp: 98.2 F (36.8 C) 98.8 F (37.1 C) 98.3 F (36.8 C) 97.8 F (36.6  C)  TempSrc: Oral Oral Oral Oral  SpO2: 90% 93% 98% 97%  Weight:      Height:        Intake/Output Summary (Last 24 hours) at 02/18/2022 0941 Last data filed at 02/18/2022 0500 Gross per 24 hour   Intake --  Output 1625 ml  Net -1625 ml   Filed Weights   02/14/22 1700  Weight: 78 kg    Examination:   General: Appearance:    elderly male in no acute distress     Lungs:     respirations unlabored  Heart:    Normal heart rate.   MS:   All extremities are intact.   Neurologic:   Awake, alert, pleasant       Data Reviewed: I have personally reviewed following labs and imaging studies  CBC: Recent Labs  Lab 02/14/22 1701 02/14/22 2020 02/15/22 0730 02/17/22 0626 02/18/22 0559  WBC 17.8* 18.3* 17.4* 7.6 10.5  NEUTROABS 15.3*  --  14.8*  --   --   HGB 13.4 12.1* 12.5* 12.8* 14.5  HCT 39.8 35.7* 36.4* 37.5* 43.4  MCV 90.9 89.5 89.9 87.8 88.9  PLT 203 208 199 220 245   Basic Metabolic Panel: Recent Labs  Lab 02/14/22 1701 02/14/22 2020 02/15/22 0855 02/16/22 0620 02/17/22 0626 02/18/22 0559  NA 142  --  140  --  135 142  K 3.8  --  3.1*  --  3.4* 4.1  CL 107  --  108  --  102 106  CO2 24  --  25  --  25 26  GLUCOSE 115*  --  100*  --  102* 107*  BUN 22  --  14  --  12 17  CREATININE 0.85 0.81 0.77  --  0.77 0.78  CALCIUM 8.6*  --  7.5*  --  8.3* 8.8*  MG  --   --   --  2.3  --  2.2   GFR: Estimated Creatinine Clearance: 88 mL/min (by C-G formula based on SCr of 0.78 mg/dL). Liver Function Tests: Recent Labs  Lab 02/14/22 1701 02/15/22 0855  AST 24 24  ALT 25 22  ALKPHOS 53 48  BILITOT 0.7 0.9  PROT 6.6 5.2*  ALBUMIN 3.3* 2.6*    Coagulation Profile: Recent Labs  Lab 02/14/22 1701  INR 1.1   Urine analysis:    Component Value Date/Time   COLORURINE YELLOW 02/14/2022 1817   APPEARANCEUR HAZY (A) 02/14/2022 1817   LABSPEC 1.021 02/14/2022 1817   PHURINE 6.0 02/14/2022 1817   GLUCOSEU NEGATIVE 02/14/2022 1817   HGBUR MODERATE (A) 02/14/2022 1817   BILIRUBINUR NEGATIVE 02/14/2022 1817   KETONESUR 20 (A) 02/14/2022 1817   PROTEINUR 100 (A) 02/14/2022 1817   NITRITE POSITIVE (A) 02/14/2022 1817   LEUKOCYTESUR LARGE (A) 02/14/2022  1817    Recent Results (from the past 240 hour(s))  Blood Culture (routine x 2)     Status: None (Preliminary result)   Collection Time: 02/14/22  5:01 PM   Specimen: BLOOD  Result Value Ref Range Status   Specimen Description   Final    BLOOD RIGHT ANTECUBITAL Performed at [facility], [place] [address] [place], [place] [address]    Special Requests   Final    BOTTLES DRAWN AEROBIC AND ANAEROBIC Blood Culture results may not be optimal due to an inadequate volume of blood received in culture bottles Performed at [place] [address], [place], [place] [address]  Culture   Final    NO GROWTH 4 DAYS Performed at [facility], [place] [address], [place], [place] [address]    Report Status PENDING  Incomplete  Blood Culture (routine x 2)     Status: None (Preliminary result)   Collection Time: 02/14/22  6:00 PM   Specimen: BLOOD  Result Value Ref Range Status   Specimen Description   Final    BLOOD BLOOD RIGHT FOREARM Performed at [place] [address], [place], [place] [address]    Special Requests   Final    BOTTLES DRAWN AEROBIC AND ANAEROBIC Blood Culture results may not be optimal due to an inadequate volume of blood received in culture bottles Performed at [place] [address], [place], [place] [address]    Culture   Final    NO GROWTH 3 DAYS Performed at [facility], [place] [address] [place], [place] [address]    Report Status PENDING  Incomplete  Urine Culture     Status: Abnormal   Collection Time: 02/14/22  6:17 PM   Specimen: In/Out Cath Urine  Result Value Ref Range Status   Specimen Description   Final    IN/OUT CATH URINE Performed at [place] [address] [place],  [address]    Special Requests   Final    NONE Performed at [facility], [place] [address], [place],  [address]    Culture  >=100,000 COLONIES/mL ESCHERICHIA COLI (A)  Final   Report Status 02/16/2022 FINAL  Final   Organism ID, Bacteria ESCHERICHIA COLI (A)  Final      Susceptibility   Escherichia coli - MIC*    AMPICILLIN 8 SENSITIVE Sensitive     CEFAZOLIN <=4 SENSITIVE Sensitive     CEFEPIME <=0.12 SENSITIVE Sensitive     CEFTRIAXONE <=0.25 SENSITIVE Sensitive     CIPROFLOXACIN <=0.25 SENSITIVE Sensitive     GENTAMICIN <=1 SENSITIVE Sensitive     IMIPENEM <=0.25 SENSITIVE Sensitive     NITROFURANTOIN <=16 SENSITIVE Sensitive     TRIMETH/SULFA <=20 SENSITIVE Sensitive     AMPICILLIN/SULBACTAM <=2 SENSITIVE Sensitive     PIP/TAZO <=4 SENSITIVE Sensitive     * >=100,000 COLONIES/mL ESCHERICHIA COLI  Resp Panel by RT-PCR (Flu A&B, Covid) Anterior Nasal Swab     Status: None   Collection Time: 02/14/22  7:22 PM   Specimen: Anterior Nasal Swab  Result Value Ref Range Status   SARS Coronavirus 2 by RT PCR NEGATIVE NEGATIVE Final    Comment: (NOTE) SARS-CoV-2 target nucleic acids are NOT DETECTED.  The SARS-CoV-2 RNA is generally detectable in upper respiratory specimens during the acute phase of infection. The lowest concentration of SARS-CoV-2 viral copies this assay can detect is 138 copies/mL. A negative result does not preclude SARS-Cov-2 infection and should not be used as the sole basis for treatment or other patient management decisions. A negative result may occur with  improper specimen collection/handling, submission of specimen other than nasopharyngeal swab, presence of viral mutation(s) within the areas targeted by this assay, and inadequate number of viral copies(<138 copies/mL). A negative result must be combined with clinical observations, patient history, and epidemiological information. The expected result is Negative.  Fact Sheet for Patients:  EntrepreneurPulse.com.au  Fact Sheet for Healthcare Providers:  IncredibleEmployment.be  This test is  no t yet approved or cleared by the [place] FDA and  has been authorized for detection and/or diagnosis  of SARS-CoV-2 by FDA under an Emergency Use Authorization (EUA). This EUA will remain  in effect (meaning this test can be used) for the duration of the COVID-19 declaration under Section 564(b)(1) of the Act, 21 U.S.C.section 360bbb-3(b)(1), unless the authorization is terminated  or revoked sooner.       Influenza A by PCR NEGATIVE NEGATIVE Final   Influenza B by PCR NEGATIVE NEGATIVE Final    Comment: (NOTE) The Xpert Xpress SARS-CoV-2/FLU/RSV plus assay is intended as an aid in the diagnosis of influenza from Nasopharyngeal swab specimens and should not be used as a sole basis for treatment. Nasal washings and aspirates are unacceptable for Xpert Xpress SARS-CoV-2/FLU/RSV testing.  Fact Sheet for Patients: EntrepreneurPulse.com.au  Fact Sheet for Healthcare Providers: IncredibleEmployment.be  This test is not yet approved or cleared by the [place] FDA and has been authorized for detection and/or diagnosis of SARS-CoV-2 by FDA under an Emergency Use Authorization (EUA). This EUA will remain in effect (meaning this test can be used) for the duration of the COVID-19 declaration under Section 564(b)(1) of the Act, 21 U.S.C. section 360bbb-3(b)(1), unless the authorization is terminated or revoked.  Performed at [facility], [place] [address], [place], [place] [address]          Radiology Studies: DG Swallowing Func-Speech Pathology  Result Date: 02/16/2022 Table formatting from the original result was not included. Objective Swallowing Evaluation: Type of Study: Bedside Swallow Evaluation  Patient Details Name: Ryan Woodward MRN: [id number] Date of Birth: [birth year] Today's Date: 02/16/2022 Time: SLP Start Time (ACUTE ONLY): 49 -SLP Stop Time (ACUTE ONLY): 1430 SLP Time Calculation (min) (ACUTE ONLY): 20 min  Past Medical History: Past Medical History: Diagnosis Date  A-fib ([place])   isolated episode during colonoscopy  Allergies   Back pain   Bladder cancer ([place]) dx aug'12  s/p turbt  Enlarged prostate with urinary obstruction   nocturia/ freq/ dysuria  GERD (gastroesophageal reflux disease)   OSA on CPAP   wears CPAP nightly  PD (Parkinson's disease) ([place])   Skin cancer of nose 05/2018  basal cell Past Surgical History: Past Surgical History: Procedure Laterality Date  CERVICAL FUSION  2000  C4-5  COLONOSCOPY    CYSTOSCOPY  06/16/2011  Procedure: CYSTOSCOPY;  Surgeon: Molli Hazard, MD;  Location: [facility];  Service: Urology;  Laterality: N/A;  CYSTOSCOPY WITH URETHRAL DILATATION  w/ TURBT BX   05-04-11  INGUINAL HERNIA REPAIR Bilateral 02/07/2019  Procedure: BILATERAL INGUINAL HERNIA REPAIR WITH MESH;  Surgeon: Donnie Mesa, MD;  Location: [place];  Service: General;  Laterality: Bilateral;  GENERAL AND TAP BLOCK ANESTHESIA  INSERTION OF MESH Bilateral 02/07/2019  Procedure: INSERTION OF MESH;  Surgeon: Donnie Mesa, MD;  Location: [place];  Service: General;  Laterality: Bilateral;  SINUS SURGERY    TRANSURETHRAL RESECTION OF PROSTATE  06/16/2011  Procedure: TRANSURETHRAL RESECTION OF THE PROSTATE (TURP);  Surgeon: Molli Hazard, MD;  Location: [facility];  Service: Urology;  Laterality: N/A;  URETHRAL DILATATION, MEATOTOMY, PENIL BLOCK HPI: 76 yo man with medical history of parkinson's disease, GERD, dementia, ACDF who presents weakness and confusion at home. Preliminary work up  concerning for sepsis from UTI and PE study was negative for PE. Found to have RUL lung mass vs pulmonary abscess.  No data recorded  Recommendations for follow up therapy are one component of a multi-disciplinary discharge planning process, led by the attending physician.  Recommendations may be  updated based on patient status, additional functional  criteria and insurance authorization. Assessment / Plan / Recommendation   02/16/2022   3:33 PM Clinical Impressions Clinical Impression Pt demonstrates a mild pharyngeal dysphagia and suspected esophageal involvement marked by reduced tongue base retraction and pharyngeal contraction. Consecutive straw sips resulted in laryngeal penetration with thin barium (PAS 3) and with smaller sips there was one instance of penetration to vocal cords briefly (PAS 5). Vallecular and pyriform sinus residue ranged from min-moderate with pyriform sinus residue attempting to spill over interarytenoid space on several occasions. Pt senses residue and performs spontaneous swallows that help to reduce volumes. Pt managed to masticate cracker thoroughly and transit. Esophageal scan revealed barium pill stopped mid esophagus and not propelled to distal esophagus with applesauce then into GE junction with thin. Given his confusion, decreased endurance, recommend Dys 2 texture, thin liquids, straws allowed but must take small sips and person assisting to feed should remove straw after 1 or 2 sips. Crush meds, multiple swallows and intermittent throat clear. ST will contiue to follow. SLP Visit Diagnosis Dysphagia, pharyngeal phase (R13.13) Impact on safety and function Mild aspiration risk;Moderate aspiration risk     02/16/2022   3:33 PM Treatment Recommendations Treatment Recommendations Therapy as outlined in treatment plan below     02/16/2022   3:33 PM Prognosis Prognosis for Safe Diet Advancement Good Barriers to Reach Goals Cognitive deficits   02/16/2022   3:33 PM Diet Recommendations SLP Diet Recommendations Thin liquid;Dysphagia 2 (Fine chop) solids Liquid Administration via Cup;Straw Medication Administration Crushed with puree Compensations Slow rate;Small sips/bites;Clear throat intermittently;Multiple dry swallows after each bite/sip Postural Changes Seated upright at 90 degrees;Remain semi-upright after after feeds/meals  (Comment)     02/16/2022   3:33 PM Other Recommendations Oral Care Recommendations Oral care BID Follow Up Recommendations -- Assistance recommended at discharge Frequent or constant Supervision/Assistance Functional Status Assessment Patient has had a recent decline in their functional status and demonstrates the ability to make significant improvements in function in a reasonable and predictable amount of time.   02/16/2022   3:33 PM Frequency and Duration  Speech Therapy Frequency (ACUTE ONLY) min 2x/week Treatment Duration 2 weeks     02/16/2022   3:33 PM Oral Phase Oral Phase [facility]    02/16/2022   3:33 PM Pharyngeal Phase Pharyngeal Phase Impaired Pharyngeal- Thin Teaspoon Pharyngeal residue - valleculae;Pharyngeal residue - pyriform Pharyngeal- Thin Cup Pharyngeal residue - valleculae;Pharyngeal residue - pyriform Pharyngeal- Thin Straw Penetration/Aspiration during swallow;Pharyngeal residue - pyriform;Pharyngeal residue - valleculae Pharyngeal Material enters airway, remains ABOVE vocal cords then ejected out;Material enters airway, remains ABOVE vocal cords and not ejected out;Material enters airway, CONTACTS cords and not ejected out Pharyngeal- Puree Pharyngeal residue - valleculae;Reduced pharyngeal peristalsis Pharyngeal- Regular Pharyngeal residue - valleculae;Reduced tongue base retraction    02/16/2022   3:33 PM Cervical Esophageal Phase  Cervical Esophageal Phase [facility] [place] Siren 02/16/2022, 3:57 PM                          Scheduled Meds:  carbidopa-levodopa  1 tablet Oral 3 times per day   Chlorhexidine Gluconate Cloth  6 each Topical Daily   clonazePAM  0.5 mg Oral QHS   donepezil  10 mg Oral QHS   enoxaparin (LOVENOX) injection  40 mg Subcutaneous Q24H   polyethylene glycol  17 g Oral Daily   QUEtiapine  25 mg Oral BID   senna-docusate  2 tablet Oral BID  tamsulosin  0.4 mg Oral QPC supper   traZODone  50 mg Oral QPM   Continuous Infusions:  ampicillin-sulbactam (UNASYN) IV 3  g (02/18/22 0234)     LOS: 4 days    Geradine Girt, DO    To contact the attending provider between 7A-7P or the covering provider during after hours 7P-7A, please log into the web site [email or web address] and access using universal [place] password for that web site. If you do not have the password, please call the hospital operator.   02/18/2022, 9:41 AM

## 2022-02-18 NOTE — TOC Progression Note (Signed)
Transition of Care [facility]) - Progression Note    Patient Details  Name: Ryan Woodward MRN: [id number] Date of Birth: 1946-03-28  Transition of Care [facility]) CM/SW Contact  Joaquin Courts, RN Phone Number: 02/18/2022, 9:35 AM  Clinical Narrative:    CM notes updated recommendations for SNF.  FL2 faxed out to all area facilities, will await bed offers.   Expected Discharge Plan: Home/Self Care Barriers to Discharge: Continued Medical Work up  Expected Discharge Plan and Services Expected Discharge Plan: Home/Self Care       Living arrangements for the past 2 months: Single Family Home Expected Discharge Date: 02/18/22                                     Social Determinants of Health (SDOH) Interventions    Readmission Risk Interventions     No data to display

## 2022-02-18 NOTE — NC FL2 (Signed)
[place] MEDICAID FL2 LEVEL OF CARE SCREENING TOOL     IDENTIFICATION  Patient Name: Ryan Woodward Birthdate: Dec 05, 1945 Sex: male Admission Date (Current Location): 02/14/2022  [facility] and [place] Number:  Herbalist and Address:  [facility],  [place], [place]      Provider Number: [id number]  Attending Physician Name and Address:  Geradine Girt, DO  Relative Name and Phone Number:       Current Level of Care: Hospital Recommended Level of Care: [place] Prior Approval Number:    Date Approved/Denied:   PASRR Number: [id number]  Discharge Plan: SNF    Current Diagnoses: Patient Active Problem List   Diagnosis Date Noted   DNR (do not resuscitate) 02/15/2022   Lung abscess ([place]) 02/15/2022   SIRS (systemic inflammatory response syndrome) ([place]) 02/14/2022   Sepsis ([place]) 02/14/2022   UTI (urinary tract infection) 02/14/2022   CAP (community acquired pneumonia) 02/14/2022   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 ([place]) 08/10/2021   Gait abnormality 08/10/2021   Feels cold 08/10/2021   Hallucinations 08/10/2021   Preop cardiovascular exam 01/17/2019   Paroxysmal atrial fibrillation ([place]) 09/20/2018   Obstructive sleep apnea 09/20/2018   Palpitations 09/20/2018   Parkinson's disease ([place]) 08/11/2018   Memory loss 05/24/2018   Benign prostate hyperplasia 06/16/2011    Orientation RESPIRATION BLADDER Height & Weight     Self  Normal Indwelling catheter Weight: 78 kg Height:  '6\' 4"'$  (193 cm)  BEHAVIORAL SYMPTOMS/MOOD NEUROLOGICAL BOWEL NUTRITION STATUS      Incontinent Diet  AMBULATORY STATUS COMMUNICATION OF NEEDS Skin   Extensive Assist Verbally Normal                       Personal Care Assistance Level of Assistance  Bathing, Feeding, Dressing, Total care Bathing Assistance: Maximum assistance Feeding assistance: Independent Dressing Assistance: Maximum assistance Total Care Assistance:  Maximum assistance   Functional Limitations Info             SPECIAL CARE FACTORS FREQUENCY  PT (By licensed PT), OT (By licensed OT)     PT Frequency: 5x weekly OT Frequency: 5x weekly            Contractures Contractures Info: Not present    Additional Factors Info  Code Status, Allergies Code Status Info: DNR Allergies Info: NKA           Current Medications (02/18/2022):  This is the current hospital active medication list Current Facility-Administered Medications  Medication Dose Route Frequency Provider Last Rate Last Admin   acetaminophen (TYLENOL) tablet 650 mg  650 mg Oral Q6H PRN Rise Patience, MD       Or   acetaminophen (TYLENOL) suppository 650 mg  650 mg Rectal Q6H PRN Rise Patience, MD       Ampicillin-Sulbactam (UNASYN) 3 g in sodium chloride 0.9 % 100 mL IVPB  3 g Intravenous Q6H Dimple Nanas, RPH 200 mL/hr at 02/18/22 0234 3 g at 02/18/22 0234   carbidopa-levodopa (SINEMET IR) 25-100 MG per tablet immediate release 1 tablet  1 tablet Oral 3 times per day Rise Patience, MD   1 tablet at 02/17/22 1724   Chlorhexidine Gluconate Cloth 2 % PADS 6 each  6 each Topical Daily Gwynne Edinger, MD   6 each at 02/17/22 1006   clonazePAM (KLONOPIN) tablet 0.5 mg  0.5 mg Oral QHS Rise Patience, MD   0.5  mg at 02/17/22 2017   donepezil (ARICEPT) tablet 10 mg  10 mg Oral QHS Rise Patience, MD   10 mg at 02/17/22 2017   enoxaparin (LOVENOX) injection 40 mg  40 mg Subcutaneous Q24H Rise Patience, MD   40 mg at 02/17/22 2017   fluticasone (FLONASE) 50 MCG/ACT nasal spray 2 spray  2 spray Each Nare Daily PRN Rise Patience, MD       haloperidol lactate (HALDOL) injection 1 mg  1 mg Intravenous Q6H PRN Domenic Polite, MD   1 mg at 02/17/22 2246   polyethylene glycol (MIRALAX / GLYCOLAX) packet 17 g  17 g Oral Daily Domenic Polite, MD   17 g at 02/17/22 1006   QUEtiapine (SEROQUEL) tablet 25 mg  25 mg Oral BID  Rise Patience, MD   25 mg at 02/17/22 2017   senna-docusate (Senokot-S) tablet 2 tablet  2 tablet Oral BID Domenic Polite, MD   2 tablet at 02/17/22 2017   tamsulosin (FLOMAX) capsule 0.4 mg  0.4 mg Oral QPC supper Gwynne Edinger, MD   0.4 mg at 02/15/22 2128   traZODone (DESYREL) tablet 50 mg  50 mg Oral QPM Domenic Polite, MD   50 mg at 02/17/22 2017     Discharge Medications: Please see discharge summary for a list of discharge medications.  Relevant Imaging Results:  Relevant Lab Results:   Additional Information SSN [id number]  Joaquin Courts, RN

## 2022-02-19 DIAGNOSIS — N401 Enlarged prostate with lower urinary tract symptoms: Secondary | ICD-10-CM

## 2022-02-19 DIAGNOSIS — A4151 Sepsis due to Escherichia coli [E. coli]: Secondary | ICD-10-CM

## 2022-02-19 DIAGNOSIS — Z66 Do not resuscitate: Secondary | ICD-10-CM | POA: Diagnosis not present

## 2022-02-19 DIAGNOSIS — G20 Parkinson's disease: Secondary | ICD-10-CM

## 2022-02-19 DIAGNOSIS — J852 Abscess of lung without pneumonia: Secondary | ICD-10-CM | POA: Diagnosis not present

## 2022-02-19 DIAGNOSIS — R3916 Straining to void: Secondary | ICD-10-CM

## 2022-02-19 DIAGNOSIS — I480 Paroxysmal atrial fibrillation: Secondary | ICD-10-CM

## 2022-02-19 LAB — CULTURE, BLOOD (ROUTINE X 2): Culture: NO GROWTH

## 2022-02-19 NOTE — Progress Notes (Signed)
SLP Cancellation Note  Patient Details Name: Ryan Woodward MRN: [id number] DOB: Sep 13, 1945   Cancelled treatment:       Reason Eval/Treat Not Completed: Other (comment) (pt currently on bed pain, will continue efforts to start RMST)  Kathleen Lime, MS [place] Office [phone] Pager [phone]  [name] 02/19/2022, 9:48 AM

## 2022-02-19 NOTE — Progress Notes (Signed)
Patient found sitting on floor by housekeeper, prior to being found patient was in bed with bilateral hand mittens on. Bed alarm on "move towards edge". Bed alarm did not alarm. MD and spouse notified of fall per this nurse. No injuries or distress noted at this time.

## 2022-02-19 NOTE — Progress Notes (Signed)
PROGRESS NOTE  Ryan Woodward HQP:[id number] DOB: 1946-01-27   PCP: Ryan Cruel, MD  Patient is from: Home.  DOA: 02/14/2022 LOS: 5  Chief complaints Chief Complaint  Patient presents with   Weakness   Diarrhea     Brief Narrative / Interim history: 76 year old M with PMH of Parkinson disease, dementia, REM sleep disorder, urinary retention, and paroxysmal A-fib returning to ED with progressive weakness and multiple episodes of watery diarrhea and abdominal discomfort and admitted for sepsis due to catheter associated UTI and possible lung abscess.  Patient was seen in ED 3 days prior and had urine retention for which Foley catheter was placed and discharged home.  In ED, febrile to 102.3.  Tachycardic and tachypneic.  UA consistent with UTI.  CXR with peripheral wedge-shaped consolidation within lateral right mid chest compatible with bronchopneumonia.  CTA chest showed 4.5 x 3.3 cm masslike consolidation within right upper lobe concerning for developing abscess versus malignancy.  CT abdomen and pelvis showed rectal wall thickening and aortic atherosclerosis.  Initially started on cefepime and azithromycin.  Antibiotic de-escalated to ceftriaxone then IV Unasyn.  Pulmonology consulted by the spouse not interested in invasive diagnostic evaluation.  PCCM recommended antibiotic for 2 weeks and outpatient follow-up for repeat imaging.  Therapy recommended SNF.  Hospital course complicated by delirium requiring pharmacologic and mechanical soft restraints which is a barrier to SNF placement.    Subjective: Seen and examined earlier this morning.  He had an episode of agitation and combativeness requiring bilateral soft restraints last night.  He looks calm this morning.  Spouse at bedside.  Patient is only oriented to self and and the spouse.  He is not oriented to place or situation.  Follows commands.  Responds no to pain.  Objective: Vitals:   02/18/22 0626 02/18/22 1332 02/18/22  2025 02/19/22 0503  BP: 95/67 103/68 112/69 107/72  Pulse: 73 75 67 78  Resp:  '18 18 18  '$ Temp: 97.8 F (36.6 C) (!) 97.4 F (36.3 C) 98.1 F (36.7 C) 97.7 F (36.5 C)  TempSrc: Oral Oral Oral Oral  SpO2: 97% 98% 99% 100%  Weight:      Height:        Examination:  GENERAL: No apparent distress.  Nontoxic. HEENT: MMM.  Vision and hearing grossly intact.  NECK: Supple.  No apparent JVD.  RESP:  No IWOB.  Fair aeration bilaterally. CVS:  RRR. Heart sounds normal.  ABD/GI/GU: BS+. Abd soft, NTND.  MSK/EXT:  Moves extremities. No apparent deformity. No edema.  Bilateral wrist restraints. SKIN: no apparent skin lesion or wound NEURO: Awake and alert.  Oriented to self and spouse.  Follows commands.  Seems to have subtotal RLE weakness with hip flexion compared to LLE.  Otherwise neurovascular intact. PSYCH: Calm. Normal affect.   Procedures:  None  Microbiology summarized: [id number] and influenza PCR nonreactive. Urine culture with pansensitive E. coli. Blood cultures NGTD  Assessment and plan: Principal Problem:   Sepsis ([place]) Active Problems:   Lung abscess ([place])   Benign prostate hyperplasia   Parkinson's disease ([place])   Paroxysmal atrial fibrillation ([facility])   Obstructive sleep apnea   UTI (urinary tract infection)   DNR (do not resuscitate)  Sepsis due to catheter associated UTI and lung abscess vs mass -Cefepime and azithromycin>> ceftriaxone>> Unasyn -PCCM recommended at least 2 weeks of antibiotics and follow-up CXR.  Patient's spouse not interested in invasive diagnostic evaluation even if follow-up CXR  does not show given his decline  over the last year-referred to PCCM consultation note from 7/17 for details). -Patient not hypoxic.  No leukocytosis.   -Can be discharged on p.o. Augmentin  Urinary retention: Has history of chronic constipation.  No significant constipation reported on recent CT. -Continue Flomax, Foley replaced 7/17 for recurrent  retention -Follow-up with urology in 1 to 2 weeks.   History of Parkinson's disease/dementia/delirium/REM sleep disorder.  Patient was agitated requiring bilateral wrist restraints last night.  He looks calm today.  He is oriented to self and spouse.  Denies pain. -Followed by neurology. -Continue home Aricept, Seroquel, trazodone, Klonopin -Started on Zyprexa due to delirium/agitation -Discontinue restraints.  History of paroxysmal A-fib -Not on anticoagulation at baseline, rate controlled currently in sinus rhythm.  Discontinued telemetry 7/19.   Rectal wall thickening: Has history of chronic constipation.  Reportedly had colonoscopy 5 to 10 years ago.  No significant report from that as far of this past records.  Difficult to perceive colonoscopy given his severe dementia. -Continue bowel regimen -Difficult to pursue colonoscopy  Dysphagia -On dysphagia 2 diet per SLP  Hypokalemia: Resolved.  Body mass index is 20.94 kg/m.           DVT prophylaxis:  enoxaparin (LOVENOX) injection 40 mg Start: 02/14/22 2200  Code Status: DNR/DNI Family Communication: Updated patient's spouse at bedside Level of care: Med-Surg Status is: Inpatient Remains inpatient appropriate because: Delirium and SNF bed   Final disposition: SNF Consultants:  PCCM  Sch Meds:  Scheduled Meds:  amoxicillin-clavulanate  1 tablet Oral Q12H   carbidopa-levodopa  1 tablet Oral 3 times per day   Chlorhexidine Gluconate Cloth  6 each Topical Daily   clonazePAM  0.5 mg Oral QHS   donepezil  10 mg Oral QHS   enoxaparin (LOVENOX) injection  40 mg Subcutaneous Q24H   OLANZapine zydis  5 mg Oral BID   polyethylene glycol  17 g Oral Daily   senna-docusate  2 tablet Oral BID   tamsulosin  0.4 mg Oral QPC supper   traZODone  50 mg Oral QPM   Continuous Infusions: PRN Meds:.acetaminophen **OR** acetaminophen, fluticasone, haloperidol lactate  Antimicrobials: Anti-infectives (From admission, onward)     Start     Dose/Rate Route Frequency Ordered Stop   02/18/22 1030  amoxicillin-clavulanate (AUGMENTIN) 875-125 MG per tablet 1 tablet        1 tablet Oral Every 12 hours 02/18/22 0942     02/18/22 0000  amoxicillin-clavulanate (AUGMENTIN) 875-125 MG tablet        1 tablet Oral 2 times daily 02/18/22 0830     02/16/22 1400  Ampicillin-Sulbactam (UNASYN) 3 g in sodium chloride 0.9 % 100 mL IVPB  Status:  Discontinued        3 g 200 mL/hr over 30 Minutes Intravenous Every 6 hours 02/16/22 1226 02/18/22 0942   02/15/22 2000  cefTRIAXone (ROCEPHIN) 1 g in sodium chloride 0.9 % 100 mL IVPB  Status:  Discontinued        1 g 200 mL/hr over 30 Minutes Intravenous Every 24 hours 02/15/22 1508 02/16/22 1130   02/14/22 2015  cefTRIAXone (ROCEPHIN) 2 g in sodium chloride 0.9 % 100 mL IVPB  Status:  Discontinued        2 g 200 mL/hr over 30 Minutes Intravenous Every 24 hours 02/14/22 2007 02/15/22 1507   02/14/22 2015  azithromycin (ZITHROMAX) 500 mg in sodium chloride 0.9 % 250 mL IVPB  Status:  Discontinued        500 mg  250 mL/hr over 60 Minutes Intravenous Every 24 hours 02/14/22 2007 02/15/22 1507   02/14/22 1715  ceFEPIme (MAXIPIME) 2 g in sodium chloride 0.9 % 100 mL IVPB        2 g 200 mL/hr over 30 Minutes Intravenous  Once 02/14/22 1700 02/14/22 1959        I have personally reviewed the following labs and images: CBC: Recent Labs  Lab 02/14/22 1701 02/14/22 2020 02/15/22 0730 02/17/22 0626 02/18/22 0559  WBC 17.8* 18.3* 17.4* 7.6 10.5  NEUTROABS 15.3*  --  14.8*  --   --   HGB 13.4 12.1* 12.5* 12.8* 14.5  HCT 39.8 35.7* 36.4* 37.5* 43.4  MCV 90.9 89.5 89.9 87.8 88.9  PLT 203 208 199 220 232   BMP &GFR Recent Labs  Lab 02/14/22 1701 02/14/22 2020 02/15/22 0855 02/16/22 0620 02/17/22 0626 02/18/22 0559  NA 142  --  140  --  135 142  K 3.8  --  3.1*  --  3.4* 4.1  CL 107  --  108  --  102 106  CO2 24  --  25  --  25 26  GLUCOSE 115*  --  100*  --  102* 107*  BUN  22  --  14  --  12 17  CREATININE 0.85 0.81 0.77  --  0.77 0.78  CALCIUM 8.6*  --  7.5*  --  8.3* 8.8*  MG  --   --   --  2.3  --  2.2   Estimated Creatinine Clearance: 88 mL/min (by C-G formula based on SCr of 0.78 mg/dL). Liver & Pancreas: Recent Labs  Lab 02/14/22 1701 02/15/22 0855  AST 24 24  ALT 25 22  ALKPHOS 53 48  BILITOT 0.7 0.9  PROT 6.6 5.2*  ALBUMIN 3.3* 2.6*   No results for input(s): "LIPASE", "AMYLASE" in the last 168 hours. No results for input(s): "AMMONIA" in the last 168 hours. Diabetic: No results for input(s): "HGBA1C" in the last 72 hours. No results for input(s): "GLUCAP" in the last 168 hours. Cardiac Enzymes: No results for input(s): "CKTOTAL", "CKMB", "CKMBINDEX", "TROPONINI" in the last 168 hours. No results for input(s): "PROBNP" in the last 8760 hours. Coagulation Profile: Recent Labs  Lab 02/14/22 1701  INR 1.1   Thyroid Function Tests: No results for input(s): "TSH", "T4TOTAL", "FREET4", "T3FREE", "THYROIDAB" in the last 72 hours. Lipid Profile: No results for input(s): "CHOL", "HDL", "LDLCALC", "TRIG", "CHOLHDL", "LDLDIRECT" in the last 72 hours. Anemia Panel: No results for input(s): "VITAMINB12", "FOLATE", "FERRITIN", "TIBC", "IRON", "RETICCTPCT" in the last 72 hours. Urine analysis:    Component Value Date/Time   COLORURINE YELLOW 02/14/2022 1817   APPEARANCEUR HAZY (A) 02/14/2022 1817   LABSPEC 1.021 02/14/2022 1817   PHURINE 6.0 02/14/2022 1817   GLUCOSEU NEGATIVE 02/14/2022 1817   HGBUR MODERATE (A) 02/14/2022 1817   BILIRUBINUR NEGATIVE 02/14/2022 1817   KETONESUR 20 (A) 02/14/2022 1817   PROTEINUR 100 (A) 02/14/2022 1817   NITRITE POSITIVE (A) 02/14/2022 1817   LEUKOCYTESUR LARGE (A) 02/14/2022 1817   Sepsis Labs: Invalid input(s): "PROCALCITONIN", "LACTICIDVEN"  Microbiology: Recent Results (from the past 240 hour(s))  Blood Culture (routine x 2)     Status: None   Collection Time: 02/14/22  5:01 PM   Specimen:  BLOOD  Result Value Ref Range Status   Specimen Description   Final    BLOOD RIGHT ANTECUBITAL Performed at [facility] [address], [place], [place] [address]  Special Requests   Final    BOTTLES DRAWN AEROBIC AND ANAEROBIC Blood Culture results may not be optimal due to an inadequate volume of blood received in culture bottles Performed at [facility] [address], [place], [place] [address]    Culture   Final    NO GROWTH 5 DAYS Performed at [facility], [place] [address], [place], [place] [address]    Report Status 02/19/2022 FINAL  Final  Blood Culture (routine x 2)     Status: None (Preliminary result)   Collection Time: 02/14/22  6:00 PM   Specimen: BLOOD  Result Value Ref Range Status   Specimen Description   Final    BLOOD BLOOD RIGHT FOREARM Performed at [facility] [address] [place], [place] [address]    Special Requests   Final    BOTTLES DRAWN AEROBIC AND ANAEROBIC Blood Culture results may not be optimal due to an inadequate volume of blood received in culture bottles Performed at [facility] [address], [place], [place] [address]    Culture   Final    NO GROWTH 4 DAYS Performed at [facility], [place] [address] [place], [place] [address]    Report Status PENDING  Incomplete  Urine Culture     Status: Abnormal   Collection Time: 02/14/22  6:17 PM   Specimen: In/Out Cath Urine  Result Value Ref Range Status   Specimen Description   Final    IN/OUT CATH URINE Performed at [facility] [address] [place], [place] [address]    Special Requests   Final    NONE Performed at [facility], [place] [address] [place], [place] [address]    Culture >=100,000 COLONIES/mL ESCHERICHIA COLI (A)  Final   Report Status 02/16/2022 FINAL  Final   Organism ID, Bacteria ESCHERICHIA COLI (A)  Final      Susceptibility    Escherichia coli - MIC*    AMPICILLIN 8 SENSITIVE Sensitive     CEFAZOLIN <=4 SENSITIVE Sensitive     CEFEPIME <=0.12 SENSITIVE Sensitive     CEFTRIAXONE <=0.25 SENSITIVE Sensitive     CIPROFLOXACIN <=0.25 SENSITIVE Sensitive     GENTAMICIN <=1 SENSITIVE Sensitive     IMIPENEM <=0.25 SENSITIVE Sensitive     NITROFURANTOIN <=16 SENSITIVE Sensitive     TRIMETH/SULFA <=20 SENSITIVE Sensitive     AMPICILLIN/SULBACTAM <=2 SENSITIVE Sensitive     PIP/TAZO <=4 SENSITIVE Sensitive     * >=100,000 COLONIES/mL ESCHERICHIA COLI  Resp Panel by RT-PCR (Flu A&B, Covid) Anterior Nasal Swab     Status: None   Collection Time: 02/14/22  7:22 PM   Specimen: Anterior Nasal Swab  Result Value Ref Range Status   SARS Coronavirus 2 by RT PCR NEGATIVE NEGATIVE Final    Comment: (NOTE) SARS-CoV-2 target nucleic acids are NOT DETECTED.  The SARS-CoV-2 RNA is generally detectable in upper respiratory specimens during the acute phase of infection. The lowest concentration of SARS-CoV-2 viral copies this assay can detect is 138 copies/mL. A negative result does not preclude SARS-Cov-2 infection and should not be used as the sole basis for treatment or other patient management decisions. A negative result may occur with  improper specimen collection/handling, submission of specimen other than nasopharyngeal swab, presence of viral mutation(s) within the areas targeted by this assay, and inadequate number of viral copies(<138 copies/mL). A negative result must be combined with clinical observations, patient history,  and epidemiological information. The expected result is Negative.  Fact Sheet for Patients:  EntrepreneurPulse.com.au  Fact Sheet for Healthcare Providers:  IncredibleEmployment.be  This test is no t yet approved or cleared by the [place] FDA and  has been authorized for detection and/or diagnosis of SARS-CoV-2 by FDA under an Emergency Use  Authorization (EUA). This EUA will remain  in effect (meaning this test can be used) for the duration of the COVID-19 declaration under Section 564(b)(1) of the Act, 21 U.S.C.section 360bbb-3(b)(1), unless the authorization is terminated  or revoked sooner.       Influenza A by PCR NEGATIVE NEGATIVE Final   Influenza B by PCR NEGATIVE NEGATIVE Final    Comment: (NOTE) The Xpert Xpress SARS-CoV-2/FLU/RSV plus assay is intended as an aid in the diagnosis of influenza from Nasopharyngeal swab specimens and should not be used as a sole basis for treatment. Nasal washings and aspirates are unacceptable for Xpert Xpress SARS-CoV-2/FLU/RSV testing.  Fact Sheet for Patients: EntrepreneurPulse.com.au  Fact Sheet for Healthcare Providers: IncredibleEmployment.be  This test is not yet approved or cleared by the [place] FDA and has been authorized for detection and/or diagnosis of SARS-CoV-2 by FDA under an Emergency Use Authorization (EUA). This EUA will remain in effect (meaning this test can be used) for the duration of the COVID-19 declaration under Section 564(b)(1) of the Act, 21 U.S.C. section 360bbb-3(b)(1), unless the authorization is terminated or revoked.  Performed at [facility], [place] [address] [place],  [address]     Radiology Studies: No results found.    Keiton Cosma T. Burton  If 7PM-7AM, please contact night-coverage [email or web address] 02/19/2022, 12:48 PM

## 2022-02-19 NOTE — Progress Notes (Addendum)
Physical Therapy Treatment Patient Details Name: Ryan Woodward MRN: [id number] DOB: Dec 28, 1945 Today's Date: 02/19/2022   History of Present Illness 76 y.o. male with history of Parkinson's, dementia, sleep disorder had come to the ER 3 days ago for urinary retention at the time had Foley catheter placed and discharged home.  As per patient's spouse over the last 3 days patient has become more weak and also has been in multiple episodes of watery diarrhea no vomiting but did complain of some abdominal discomfort.  UA is consistent with UTI and chest x-ray shows which is hazy defect concerning for pneumonia.  On further work-up noted to have lung abscess as well, pulmonary consulted, work-up notable for dysphagia  -Hospitalization complicated by delirium. Patient admitted for Sepsis  -Secondary to UTI and lung abscess    PT Comments    General Comments: AxO x 1 following repeat functional commands.  Able to express how he feels.  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 Making good eye contact.  Typical Parkinson's slow to respond and soft voice. Husband visiting at bed side but left as we entered room.   Pt in bed with B wrist restraints applied and on bed pan (unknown amount of time). Assisted OOB to [facility] then amb was difficult.  General bed mobility comments: increased time and repeat functional VC's to complete task.  Typical Parkinson's brady kinesia, rigidity, stop and go movements.  Utilized bed pad to complete pivot/scooting to EOB.  Poor forward flex posture.  Mild drooling.  Cognition was slow but following commands. General transfer comment: first assisted from elevated bed to [facility].  Unsteady.  Rigid.  Increased assist to complete 1/4 pivot and control stand to sit. General Gait Details: Typical Parkinson's gait of delayed initiation, shuffle short steps, festination, freezing requiring + 2 assist.  Poor forward flex posture.  Near syncope episode.  Assisted with amb pt in  hallway but after 18 feet pt started to c/o dizziness, appeared pale in color/lips/eyes fixed.  Quickly assisted to recliner.   BP seated in recliner BP   111/73     HR 104   RA 90% Standing                     BP  63/36       HR 133 irregular   RA 86% Assisted to recliner partial supine    BP 97/99    HR  54 irregular  RA 94%   RN called to room.  Pt in recliner, "feeling better".  Wanted a Coke.    Pt will need ST Rehab at SNF to address mobility and functional decline prior to safely returning home.   Recommendations for follow up therapy are one component of a multi-disciplinary discharge planning process, led by the attending physician.  Recommendations may be updated based on patient status, additional functional criteria and insurance authorization.  Follow Up Recommendations  Skilled nursing-short term rehab (<3 hours/day) Can patient physically be transported by private vehicle: No   Assistance Recommended at Discharge Frequent or constant Supervision/Assistance  Patient can return home with the following Two people to help with walking and/or transfers;A lot of help with bathing/dressing/bathroom;Help with stairs or ramp for entrance;Assist for transportation;Assistance with cooking/housework   Equipment Recommendations       Recommendations for Other Services       Precautions / Restrictions Precautions Precautions: Fall Precaution Comments: wrist restraints     Mobility  Bed Mobility  Overal bed mobility: Needs Assistance Bed Mobility: Supine to Sit     Supine to sit: Max assist     General bed mobility comments: increased time and repeat functional VC's to complete task.  Typical Parkinson's brady kinesia, rigidity, stop and go movements.  Utilized bed pad to complete pivot/scooting to EOB.  Poor forward flex posture.  Mild drooling.  Cognition was slow but following commands.    Transfers Overall transfer level: Needs assistance Equipment used: Rolling walker  (2 wheels) Transfers: Sit to/from Stand Sit to Stand: Mod assist, From elevated surface, +2 physical assistance, +2 safety/equipment Stand pivot transfers: Mod assist, Max assist, +2 physical assistance, +2 safety/equipment         General transfer comment: first assisted from elevated bed to [facility].  Unsteady.  Rigid.  Increased assist to complete 1/4 pivot and control stand to sit.    Ambulation/Gait Ambulation/Gait assistance: Mod assist, +2 physical assistance, +2 safety/equipment Gait Distance (Feet): 18 Feet Assistive device: Rolling walker (2 wheels) Gait Pattern/deviations: Step-to pattern, Trunk flexed, Shuffle, Festinating, Narrow base of support Gait velocity: decreased     General Gait Details: Typical Parkinson's gait of delayed initiation, shuffle short steps, festination, freezing requiring + 2 assist.  Poor forward flex posture.   Stairs             Wheelchair Mobility    Modified Rankin (Stroke Patients Only)       Balance                                            Cognition Arousal/Alertness: Awake/alert   Overall Cognitive Status: History of cognitive impairments - at baseline                                 General Comments: AxO x 1 following repeat functional commands.  Able to express how he feels.  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 Making good eye contact.  Typical Parkinson's slow to respond and soft voice.        Exercises      General Comments        Pertinent Vitals/Pain Pain Assessment Pain Assessment: No/denies pain    Home Living                          Prior Function            PT Goals (current goals can now be found in the care plan section) Progress towards PT goals: Progressing toward goals    Frequency    Min 2X/week      PT Plan Current plan remains appropriate    Co-evaluation              AM-PAC PT "6 Clicks" Mobility    Outcome Measure  Help needed turning from your back to your side while in a flat bed without using bedrails?: A Lot Help needed moving from lying on your back to sitting on the side of a flat bed without using bedrails?: A Lot Help needed moving to and from a bed to a chair (including a wheelchair)?: A Lot Help needed standing up from a chair using your arms (e.g., wheelchair or bedside chair)?: A Lot Help needed to walk in hospital room?:  A Lot Help needed climbing 3-5 steps with a railing? : Total 6 Click Score: 11    End of Session Equipment Utilized During Treatment: Gait belt Activity Tolerance: Other (comment) (orthostatic) Patient left: in chair;with call bell/phone within reach;with chair alarm set Nurse Communication: Mobility status PT Visit Diagnosis: Muscle weakness (generalized) (M62.81);Difficulty in walking, not elsewhere classified (R26.2)     Time: 1040-1110 PT Time Calculation (min) (ACUTE ONLY): 30 min  Charges:  $Gait Training: 8-22 mins $Therapeutic Activity: 8-22 mins                     Rica Koyanagi  PTA Acute  Rehabilitation Services Office M-F          [phone] Weekend pager [phone]

## 2022-02-19 NOTE — Progress Notes (Signed)
PHYSICAL THERAPY  Near syncope episode.  Assisted with amb pt in hallway but after 18 feet pt started to c/o dizziness, appeared pale in color/lips/eyes fixed.  Quickly assisted to recliner.   BP seated in recliner BP   111/73     HR 104   RA 90% Standing                     BP  63/36       HR 133 irregular   RA 86% Assisted to recliner partial supine    BP 97/99    HR  54 irregular  RA 94%  RN called to room.    Pt currently in recliner, alert.  Reports "feel better".    Rica Koyanagi  PTA Acute  Rehabilitation Services Office M-F          [phone] Weekend pager [phone]

## 2022-02-19 NOTE — Progress Notes (Signed)
Speech Language Pathology Treatment: Dysphagia  Patient Details Name: Ryan Woodward MRN: [id number] DOB: 07/24/1946 Today's Date: 02/19/2022 Time: 0160-1093 SLP Time Calculation (min) (ACUTE ONLY): 35 min  Assessment / Plan / Recommendation Clinical Impression  Skilled SlP follow up regarding pt's swallow function.  He is alert, sitting up in bed and had recently worked with PT, no family present at this time. SLP initiated RMST using [employer] Positive Expiratory Pressure Device set clinically at 6.75 using optimal work requirment for pt on scale of 1/10 for effort.  Pt performed this exercise beautifully - fading from max cues to min.  Left trainer and Anticipate he will require supervision due to his cognition.  Pt reported effort at level 7/10 with 10 repetitions.  Educated pt and NT to clinical purpose of exercise.  Also faciliated po intake by assisting pt to eat his lunch including fish, mashed potatoes, brocolli and drinking tea, water.  Multiple swallows noted across all consistencies but no throat clearing or coughing.  Recommend pt continue dys2/thin diet with precautions and follow up with SLP at SNF for dysphagia treatment/ strengthening.  Pt has made excellent progress.    HPI HPI: 76 yo man with medical history of parkinson's disease, GERD, dementia, ACDF who presents weakness and confusion at home. Preliminary work up  concerning for sepsis from UTI and PE study was negative for PE. Found to have RUL lung mass vs pulmonary abscess.      SLP Plan  Continue with current plan of care      Recommendations for follow up therapy are one component of a multi-disciplinary discharge planning process, led by the attending physician.  Recommendations may be updated based on patient status, additional functional criteria and insurance authorization.    Recommendations  Diet recommendations: Dysphagia 2 (fine chop);Thin liquid Liquids provided via: Cup Medication Administration:  Crushed with puree (whole if small) Supervision: Patient able to self feed Compensations: Slow rate;Small sips/bites;Clear throat intermittently;Multiple dry swallows after each bite/sip Postural Changes and/or Swallow Maneuvers: Seated upright 90 degrees;Out of bed for meals;Upright 30-60 min after meal                Oral Care Recommendations: Oral care BID Follow Up Recommendations: Skilled nursing-short term rehab (<3 hours/day) Assistance recommended at discharge: Frequent or constant Supervision/Assistance SLP Visit Diagnosis: Dysphagia, pharyngeal phase (R13.13) Plan: Continue with current plan of care         Ryan Lime, MS [place] Office [phone] Pager [phone]   Ryan Woodward  02/19/2022, 12:41 PM

## 2022-02-20 DIAGNOSIS — J852 Abscess of lung without pneumonia: Secondary | ICD-10-CM | POA: Diagnosis not present

## 2022-02-20 DIAGNOSIS — Y92239 Unspecified place in hospital as the place of occurrence of the external cause: Secondary | ICD-10-CM

## 2022-02-20 DIAGNOSIS — Z7189 Other specified counseling: Secondary | ICD-10-CM

## 2022-02-20 DIAGNOSIS — A4151 Sepsis due to Escherichia coli [E. coli]: Secondary | ICD-10-CM | POA: Diagnosis not present

## 2022-02-20 DIAGNOSIS — G20 Parkinson's disease: Secondary | ICD-10-CM | POA: Diagnosis not present

## 2022-02-20 DIAGNOSIS — W19XXXA Unspecified fall, initial encounter: Secondary | ICD-10-CM

## 2022-02-20 LAB — CULTURE, BLOOD (ROUTINE X 2): Culture: NO GROWTH

## 2022-02-20 MED ORDER — HYDROMORPHONE HCL 1 MG/ML IJ SOLN
0.5000 mg | INTRAMUSCULAR | Status: DC | PRN
  Filled 2022-02-20 (×2): qty 0.5

## 2022-02-20 MED ORDER — HYDROMORPHONE HCL 2 MG PO TABS
2.0000 mg | ORAL_TABLET | ORAL | Status: DC | PRN
  Administered 2022-02-20: 2 mg via ORAL
  Filled 2022-02-20: qty 1

## 2022-02-20 NOTE — Progress Notes (Signed)
PROGRESS NOTE  Ryan Woodward HGD:[id number] DOB: [birth year]   PCP: Lawerance Cruel, MD  Patient is from: Home.  DOA: 02/14/2022 LOS: 6  Chief complaints Chief Complaint  Patient presents with   Weakness   Diarrhea     Brief Narrative / Interim history: 76 year old M with PMH of Parkinson disease, dementia, REM sleep disorder, urinary retention, and paroxysmal A-fib returning to ED with progressive weakness and multiple episodes of watery diarrhea and abdominal discomfort and admitted for sepsis due to catheter associated UTI and possible lung abscess.  Patient was seen in ED 3 days prior and had urine retention for which Foley catheter was placed and discharged home.  In ED, febrile to 102.3.  Tachycardic and tachypneic.  UA consistent with UTI.  CXR with peripheral wedge-shaped consolidation within lateral right mid chest compatible with bronchopneumonia.  CTA chest showed 4.5 x 3.3 cm masslike consolidation within right upper lobe concerning for developing abscess versus malignancy.  CT abdomen and pelvis showed rectal wall thickening and aortic atherosclerosis.  Initially started on cefepime and azithromycin.  Antibiotic de-escalated to ceftriaxone then IV Unasyn.  Pulmonology consulted by the spouse not interested in invasive diagnostic evaluation.  PCCM recommended antibiotic for 2 weeks and outpatient follow-up for repeat imaging.  Therapy recommended SNF.  Hospital course complicated by delirium requiring pharmacologic and bedside sitter.  Palliative medicine consulted for further goals of care discussion.   Subjective: Seen and examined earlier this morning.  No major events overnight of this morning.  No complaints but not a reliable historian.  He is only oriented to self.  Follows commands.  Responds no to pain.  Safety sitter, significant other and other family member at bedside.  Objective: Vitals:   02/19/22 1407 02/19/22 2040 02/20/22 0601 02/20/22 1429  BP: (!) 119/93  (!) 106/59 97/66 103/68  Pulse: 89 80 83 80  Resp: '19 18 14 16  '$ Temp: 97.6 F (36.4 C) 98.2 F (36.8 C) 98.1 F (36.7 C) 98.4 F (36.9 C)  TempSrc: Oral Oral Oral Oral  SpO2: (!) 71% 100% 100% 100%  Weight:      Height:        Examination:  GENERAL: No apparent distress.  Nontoxic. HEENT: MMM.  Vision and hearing grossly intact.  NECK: Supple.  No apparent JVD.  RESP:  No IWOB.  Fair aeration bilaterally. CVS:  RRR. Heart sounds normal.  ABD/GI/GU: BS+. Abd soft, NTND.  MSK/EXT:  Moves extremities. No apparent deformity. No edema.  SKIN: no apparent skin lesion or wound NEURO: Awake.  Oriented to self.  Follows commands.  No apparent focal neuro deficit. PSYCH: Calm. Normal affect.  Procedures:  None  Microbiology summarized: [id number] and influenza PCR nonreactive. Urine culture with pansensitive E. coli. Blood cultures NGTD  Assessment and plan: Principal Problem:   Sepsis ([place]) Active Problems:   Lung abscess ([place])   Benign prostate hyperplasia   Parkinson's disease ([place])   Paroxysmal atrial fibrillation ([facility])   Obstructive sleep apnea   UTI (urinary tract infection)   DNR (do not resuscitate)  Sepsis due to catheter associated UTI and lung abscess vs mass -Cefepime and azithromycin>> ceftriaxone>> Unasyn -PCCM recommended at least 2 weeks of antibiotics and follow-up CXR.  Patient's spouse not interested in invasive diagnostic evaluation even if follow-up CXR  does not show given his decline over the last year-referred to [facility] consultation note from 7/17 for details). -Patient not hypoxic.  No leukocytosis.   -Can be discharged on p.o. Augmentin  Urinary retention: Has history of chronic constipation.  No significant constipation reported on recent CT. -Continue Flomax, Foley replaced 7/17 for recurrent retention -Follow-up with urology in 1 to 2 weeks.   History of Parkinson's disease/dementia/delirium/REM sleep disorder: His dementia seems to be severe.   He is only oriented to self.  He is followed by neurology outpatient -Continue home Aricept, Seroquel, trazodone, Klonopin -Zyprexa started on 7/20 due to delirium/agitation -Continue safety sitter due to risk of fall and injury.  Unwitnessed fall in the hospital: Was found down on the floor on 7/21.  No signs of injury or new focal neuro deficit. -Continue safety sitter and fall precaution  History of paroxysmal A-fib -Not on anticoagulation at baseline, rate controlled currently in sinus rhythm.  Discontinued telemetry 7/19.   Rectal wall thickening: Has history of chronic constipation.  Reportedly had colonoscopy 5 to 10 years ago.  No significant report from that as far of this past records.  Difficult to perceive colonoscopy given his severe dementia. -Continue bowel regimen -Difficult to pursue colonoscopy with his severe dementia  Dysphagia -On dysphagia 2 diet per SLP  Hypokalemia: Resolved.  Goal of care counseling: DNR/DNI which is appropriate.  Overall, grim prognosis with his severe dementia.  I do not think he would do well at SNF.  Definitely a candidate for hospice.  Patient's spouse voiced understanding.  Spouse appreciated palliative care consult  Body mass index is 20.94 kg/m.           DVT prophylaxis:  enoxaparin (LOVENOX) injection 40 mg Start: 02/14/22 2200  Code Status: DNR/DNI Family Communication: Updated patient's spouse at bedside Level of care: Med-Surg Status is: Inpatient Remains inpatient appropriate because: Delirium   Final disposition: TBD Consultants:  PCCM Palliative medicine  Sch Meds:  Scheduled Meds:  amoxicillin-clavulanate  1 tablet Oral Q12H   carbidopa-levodopa  1 tablet Oral 3 times per day   Chlorhexidine Gluconate Cloth  6 each Topical Daily   clonazePAM  0.5 mg Oral QHS   donepezil  10 mg Oral QHS   enoxaparin (LOVENOX) injection  40 mg Subcutaneous Q24H   OLANZapine zydis  5 mg Oral BID   polyethylene glycol  17 g  Oral Daily   senna-docusate  2 tablet Oral BID   tamsulosin  0.4 mg Oral QPC supper   traZODone  50 mg Oral QPM   Continuous Infusions: PRN Meds:.acetaminophen **OR** acetaminophen, fluticasone, haloperidol lactate  Antimicrobials: Anti-infectives (From admission, onward)    Start     Dose/Rate Route Frequency Ordered Stop   02/18/22 1030  amoxicillin-clavulanate (AUGMENTIN) 875-125 MG per tablet 1 tablet        1 tablet Oral Every 12 hours 02/18/22 0942     02/18/22 0000  amoxicillin-clavulanate (AUGMENTIN) 875-125 MG tablet        1 tablet Oral 2 times daily 02/18/22 0830     02/16/22 1400  Ampicillin-Sulbactam (UNASYN) 3 g in sodium chloride 0.9 % 100 mL IVPB  Status:  Discontinued        3 g 200 mL/hr over 30 Minutes Intravenous Every 6 hours 02/16/22 1226 02/18/22 0942   02/15/22 2000  cefTRIAXone (ROCEPHIN) 1 g in sodium chloride 0.9 % 100 mL IVPB  Status:  Discontinued        1 g 200 mL/hr over 30 Minutes Intravenous Every 24 hours 02/15/22 1508 02/16/22 1130   02/14/22 2015  cefTRIAXone (ROCEPHIN) 2 g in sodium chloride 0.9 % 100 mL IVPB  Status:  Discontinued  2 g 200 mL/hr over 30 Minutes Intravenous Every 24 hours 02/14/22 2007 02/15/22 1507   02/14/22 2015  azithromycin (ZITHROMAX) 500 mg in sodium chloride 0.9 % 250 mL IVPB  Status:  Discontinued        500 mg 250 mL/hr over 60 Minutes Intravenous Every 24 hours 02/14/22 2007 02/15/22 1507   02/14/22 1715  ceFEPIme (MAXIPIME) 2 g in sodium chloride 0.9 % 100 mL IVPB        2 g 200 mL/hr over 30 Minutes Intravenous  Once 02/14/22 1700 02/14/22 1959        I have personally reviewed the following labs and images: CBC: Recent Labs  Lab 02/14/22 1701 02/14/22 2020 02/15/22 0730 02/17/22 0626 02/18/22 0559  WBC 17.8* 18.3* 17.4* 7.6 10.5  NEUTROABS 15.3*  --  14.8*  --   --   HGB 13.4 12.1* 12.5* 12.8* 14.5  HCT 39.8 35.7* 36.4* 37.5* 43.4  MCV 90.9 89.5 89.9 87.8 88.9  PLT 203 208 199 220 232   BMP  &GFR Recent Labs  Lab 02/14/22 1701 02/14/22 2020 02/15/22 0855 02/16/22 0620 02/17/22 0626 02/18/22 0559  NA 142  --  140  --  135 142  K 3.8  --  3.1*  --  3.4* 4.1  CL 107  --  108  --  102 106  CO2 24  --  25  --  25 26  GLUCOSE 115*  --  100*  --  102* 107*  BUN 22  --  14  --  12 17  CREATININE 0.85 0.81 0.77  --  0.77 0.78  CALCIUM 8.6*  --  7.5*  --  8.3* 8.8*  MG  --   --   --  2.3  --  2.2   Estimated Creatinine Clearance: 88 mL/min (by C-G formula based on SCr of 0.78 mg/dL). Liver & Pancreas: Recent Labs  Lab 02/14/22 1701 02/15/22 0855  AST 24 24  ALT 25 22  ALKPHOS 53 48  BILITOT 0.7 0.9  PROT 6.6 5.2*  ALBUMIN 3.3* 2.6*   No results for input(s): "LIPASE", "AMYLASE" in the last 168 hours. No results for input(s): "AMMONIA" in the last 168 hours. Diabetic: No results for input(s): "HGBA1C" in the last 72 hours. No results for input(s): "GLUCAP" in the last 168 hours. Cardiac Enzymes: No results for input(s): "CKTOTAL", "CKMB", "CKMBINDEX", "TROPONINI" in the last 168 hours. No results for input(s): "PROBNP" in the last 8760 hours. Coagulation Profile: Recent Labs  Lab 02/14/22 1701  INR 1.1   Thyroid Function Tests: No results for input(s): "TSH", "T4TOTAL", "FREET4", "T3FREE", "THYROIDAB" in the last 72 hours. Lipid Profile: No results for input(s): "CHOL", "HDL", "LDLCALC", "TRIG", "CHOLHDL", "LDLDIRECT" in the last 72 hours. Anemia Panel: No results for input(s): "VITAMINB12", "FOLATE", "FERRITIN", "TIBC", "IRON", "RETICCTPCT" in the last 72 hours. Urine analysis:    Component Value Date/Time   COLORURINE YELLOW 02/14/2022 1817   APPEARANCEUR HAZY (A) 02/14/2022 1817   LABSPEC 1.021 02/14/2022 1817   PHURINE 6.0 02/14/2022 1817   GLUCOSEU NEGATIVE 02/14/2022 1817   HGBUR MODERATE (A) 02/14/2022 1817   BILIRUBINUR NEGATIVE 02/14/2022 1817   KETONESUR 20 (A) 02/14/2022 1817   PROTEINUR 100 (A) 02/14/2022 1817   NITRITE POSITIVE (A)  02/14/2022 1817   LEUKOCYTESUR LARGE (A) 02/14/2022 1817   Sepsis Labs: Invalid input(s): "PROCALCITONIN", "LACTICIDVEN"  Microbiology: Recent Results (from the past 240 hour(s))  Blood Culture (routine x 2)     Status: None  Collection Time: 02/14/22  5:01 PM   Specimen: BLOOD  Result Value Ref Range Status   Specimen Description   Final    BLOOD RIGHT ANTECUBITAL Performed at [facility] [address] [place], [place] [address]    Special Requests   Final    BOTTLES DRAWN AEROBIC AND ANAEROBIC Blood Culture results may not be optimal due to an inadequate volume of blood received in culture bottles Performed at [facility] [address], [place], [place] [address]    Culture   Final    NO GROWTH 5 DAYS Performed at [facility], [place] [address], [place], [place] [address]    Report Status 02/19/2022 FINAL  Final  Blood Culture (routine x 2)     Status: None   Collection Time: 02/14/22  6:00 PM   Specimen: BLOOD  Result Value Ref Range Status   Specimen Description   Final    BLOOD BLOOD RIGHT FOREARM Performed at [facility] [address] [place], [place] [address]    Special Requests   Final    BOTTLES DRAWN AEROBIC AND ANAEROBIC Blood Culture results may not be optimal due to an inadequate volume of blood received in culture bottles Performed at [facility] [address], [place], [place] [address]    Culture   Final    NO GROWTH 5 DAYS Performed at [facility], [place] [address], [place], [place] [address]    Report Status 02/20/2022 FINAL  Final  Urine Culture     Status: Abnormal   Collection Time: 02/14/22  6:17 PM   Specimen: In/Out Cath Urine  Result Value Ref Range Status   Specimen Description   Final    IN/OUT CATH URINE Performed at [facility] [address] [place], [place] [address]    Special Requests   Final    NONE Performed  at [facility], [place] [address], [place], [place] [address]    Culture >=100,000 COLONIES/mL ESCHERICHIA COLI (A)  Final   Report Status 02/16/2022 FINAL  Final   Organism ID, Bacteria ESCHERICHIA COLI (A)  Final      Susceptibility   Escherichia coli - MIC*    AMPICILLIN 8 SENSITIVE Sensitive     CEFAZOLIN <=4 SENSITIVE Sensitive     CEFEPIME <=0.12 SENSITIVE Sensitive     CEFTRIAXONE <=0.25 SENSITIVE Sensitive     CIPROFLOXACIN <=0.25 SENSITIVE Sensitive     GENTAMICIN <=1 SENSITIVE Sensitive     IMIPENEM <=0.25 SENSITIVE Sensitive     NITROFURANTOIN <=16 SENSITIVE Sensitive     TRIMETH/SULFA <=20 SENSITIVE Sensitive     AMPICILLIN/SULBACTAM <=2 SENSITIVE Sensitive     PIP/TAZO <=4 SENSITIVE Sensitive     * >=100,000 COLONIES/mL ESCHERICHIA COLI  Resp Panel by RT-PCR (Flu A&B, Covid) Anterior Nasal Swab     Status: None   Collection Time: 02/14/22  7:22 PM   Specimen: Anterior Nasal Swab  Result Value Ref Range Status   SARS Coronavirus 2 by RT PCR NEGATIVE NEGATIVE Final    Comment: (NOTE) SARS-CoV-2 target nucleic acids are NOT DETECTED.  The SARS-CoV-2 RNA is generally detectable in upper respiratory specimens during the acute phase of infection. The lowest concentration of SARS-CoV-2 viral copies this assay can detect is 138 copies/mL. A negative result does not preclude SARS-Cov-2 infection and should not be used as the sole basis for treatment or other patient management decisions. A negative  result may occur with  improper specimen collection/handling, submission of specimen other than nasopharyngeal swab, presence of viral mutation(s) within the areas targeted by this assay, and inadequate number of viral copies(<138 copies/mL). A negative result must be combined with clinical observations, patient history, and epidemiological information. The expected result is Negative.  Fact Sheet for Patients:  EntrepreneurPulse.com.au  Fact  Sheet for Healthcare Providers:  IncredibleEmployment.be  This test is no t yet approved or cleared by the [place] FDA and  has been authorized for detection and/or diagnosis of SARS-CoV-2 by FDA under an Emergency Use Authorization (EUA). This EUA will remain  in effect (meaning this test can be used) for the duration of the COVID-19 declaration under Section 564(b)(1) of the Act, 21 U.S.C.section 360bbb-3(b)(1), unless the authorization is terminated  or revoked sooner.       Influenza A by PCR NEGATIVE NEGATIVE Final   Influenza B by PCR NEGATIVE NEGATIVE Final    Comment: (NOTE) The Xpert Xpress SARS-CoV-2/FLU/RSV plus assay is intended as an aid in the diagnosis of influenza from Nasopharyngeal swab specimens and should not be used as a sole basis for treatment. Nasal washings and aspirates are unacceptable for Xpert Xpress SARS-CoV-2/FLU/RSV testing.  Fact Sheet for Patients: EntrepreneurPulse.com.au  Fact Sheet for Healthcare Providers: IncredibleEmployment.be  This test is not yet approved or cleared by the [place] FDA and has been authorized for detection and/or diagnosis of SARS-CoV-2 by FDA under an Emergency Use Authorization (EUA). This EUA will remain in effect (meaning this test can be used) for the duration of the COVID-19 declaration under Section 564(b)(1) of the Act, 21 U.S.C. section 360bbb-3(b)(1), unless the authorization is terminated or revoked.  Performed at [facility], [place] [address], [place], [place] [address]     Radiology Studies: No results found.    [place]rer[place] Harold T. [place]  If 7PM-7AM, please contact night-coverage [email or web address] 02/20/2022, 4:22 PM

## 2022-02-21 DIAGNOSIS — Z515 Encounter for palliative care: Secondary | ICD-10-CM | POA: Diagnosis not present

## 2022-02-21 DIAGNOSIS — J852 Abscess of lung without pneumonia: Secondary | ICD-10-CM | POA: Diagnosis not present

## 2022-02-21 DIAGNOSIS — A4151 Sepsis due to Escherichia coli [E. coli]: Secondary | ICD-10-CM | POA: Diagnosis not present

## 2022-02-21 DIAGNOSIS — Z66 Do not resuscitate: Secondary | ICD-10-CM | POA: Diagnosis not present

## 2022-02-21 DIAGNOSIS — N401 Enlarged prostate with lower urinary tract symptoms: Secondary | ICD-10-CM | POA: Diagnosis not present

## 2022-02-21 LAB — CREATININE, SERUM
Creatinine, Ser: 0.88 mg/dL (ref 0.61–1.24)
GFR, Estimated: >60 mL/min (ref >60)

## 2022-02-21 MED ORDER — LORAZEPAM 2 MG/ML IJ SOLN
1.0000 mg | INTRAMUSCULAR | Status: DC
  Administered 2022-02-21 – 2022-02-22 (×8): 1 mg via INTRAVENOUS
  Filled 2022-02-21 (×8): qty 1

## 2022-02-21 MED ORDER — GLYCOPYRROLATE 0.2 MG/ML IJ SOLN
0.3000 mg | Freq: Once | INTRAMUSCULAR | Status: AC
Start: 2022-02-21 — End: 2022-02-21
  Administered 2022-02-21: 0.3 mg via SUBCUTANEOUS
  Filled 2022-02-21: qty 2

## 2022-02-21 MED ORDER — MORPHINE 100MG IN NS 100ML (1MG/ML) PREMIX INFUSION
1.0000 mg/h | INTRAVENOUS | Status: DC
  Administered 2022-02-21: 1 mg/h via INTRAVENOUS
  Filled 2022-02-21: qty 100

## 2022-02-21 MED ORDER — MORPHINE SULFATE (CONCENTRATE) 10 MG/0.5ML PO SOLN
20.0000 mg | ORAL | Status: DC
  Administered 2022-02-21: 20 mg via ORAL
  Filled 2022-02-21: qty 1

## 2022-02-21 MED ORDER — MORPHINE BOLUS VIA INFUSION: 2.0000 mg | INTRAVENOUS | Status: DC | PRN

## 2022-02-21 NOTE — TOC Progression Note (Addendum)
Transition of Care [facility]) - Progression Note    Patient Details  Name: Ryan Woodward MRN: [id number] Date of Birth: 10-05-1945  Transition of Care [facility]) CM/SW Contact  Ross Ludwig, [place] Phone Number: 02/21/2022, 3:00 PM  Clinical Narrative:     Patient switched to comfort care, per palliative, anticipate hospital death.  CSW to continue to monitor in case patient improves enough to go to inpatient hospice facility.    CSW to continue to follow patient's progress throughout discharge planning.  Barriers to Discharge: Continued Medical Work up  Expected Discharge Plan and Services Expected Discharge Plan: Home/Self Care       Living arrangements for the past 2 months: Single Family Home Expected Discharge Date: 02/18/22                                     Social Determinants of Health (SDOH) Interventions    Readmission Risk Interventions     No data to display

## 2022-02-21 NOTE — Progress Notes (Addendum)
Ryan Woodward is now comfort care. He is restless at times and confused. Many family members have been in to visit with Ryan Woodward. Patient refused to reposition off back. Morphine gtt.has been started , Ryan Woodward is resting comfortably. With Morphine gtt started, the PO order for morphine will stop being given.

## 2022-02-21 NOTE — Progress Notes (Signed)
Palliative Care Progress Note  Mr. Peart has acutely declined overnight. He is now mostly unresponsive, very congested and grimacing with discomfort. His eyes are open but he is not tracking in the room, he appears to be in distress. Family gathered at the bedside -his spouse of the past 6 years, Cletus Gash is present and is his primary Media planner. I met with Cletus Gash and a decision was made to focus on comfort and dignity as Ryan Woodward faces the end of his life. Cletus Gash shared with me that they have had a good and long life together and his main goal is for him not to suffer. I introduced hospice and possible need for hospice IPU if he stabilizes.   Comfort measures only. Initiate Morphine Comfort Care infusion when IV access is established, will use Roxanol until access can be obtained. Ativan for agitation. Comfort care order set placed. Anticipate hospital death- if he stabilizes may consider hospice house.  Lane Hacker, DO Palliative Medicine  Time: 35 min

## 2022-02-21 NOTE — Progress Notes (Signed)
PROGRESS NOTE  Ryan Woodward MCN:[id number] DOB: [birth year]   PCP: Lawerance Cruel, MD  Patient is from: Home.  DOA: 02/14/2022 LOS: 7  Chief complaints Chief Complaint  Patient presents with   Weakness   Diarrhea     Brief Narrative / Interim history: 76 year old M with PMH of Parkinson disease, dementia, REM sleep disorder, urinary retention, and paroxysmal A-fib returning to ED with progressive weakness and multiple episodes of watery diarrhea and abdominal discomfort and admitted for sepsis due to catheter associated UTI and possible lung abscess.  Patient was seen in ED 3 days prior and had urine retention for which Foley catheter was placed and discharged home.  In ED, febrile to 102.3.  Tachycardic and tachypneic.  UA consistent with UTI.  CXR with peripheral wedge-shaped consolidation within lateral right mid chest compatible with bronchopneumonia.  CTA chest showed 4.5 x 3.3 cm masslike consolidation within right upper lobe concerning for developing abscess versus malignancy.  CT abdomen and pelvis showed rectal wall thickening and aortic atherosclerosis.  Initially started on cefepime and azithromycin.  Antibiotic de-escalated to ceftriaxone then IV Unasyn.  Pulmonology consulted by the spouse not interested in invasive diagnostic evaluation.  PCCM recommended antibiotic for 2 weeks and outpatient follow-up for repeat imaging.  Therapy recommended SNF.  Hospital course complicated by delirium requiring pharmacologic and bedside sitter.  Palliative medicine consulted and he was transitioned to full comfort care on 7/23.   Subjective: Seen and examined earlier this morning.  No major events overnight of this morning.  He was very sleepy this morning.  Safety sitter at bedside.   Objective: Vitals:   02/20/22 0601 02/20/22 1429 02/20/22 2053 02/21/22 0500  BP: 97/66 103/68 (!) 165/87 (!) 115/99  Pulse: 83 80 98 78  Resp: 14 16 (!) 22 18  Temp: 98.1 F (36.7 C) 98.4 F (36.9  C) 98.1 F (36.7 C) 98.4 F (36.9 C)  TempSrc: Oral Oral Oral Oral  SpO2: 100% 100% 96% 98%  Weight:      Height:        Examination:  GENERAL: No apparent distress.  Nontoxic. RESP:  No IWOB.  Fair aeration bilaterally. CVS:  RRR. Heart sounds normal.  ABD/GI/GU: BS+. Abd soft, NTND.  MSK/EXT: No apparent deformity. No edema.  Moves extremities. SKIN: no apparent skin lesion or wound NEURO: Very sleepy. PSYCH: Calm.  No distress or agitation.  Procedures:  None  Microbiology summarized: [id number] and influenza PCR nonreactive. Urine culture with pansensitive E. coli. Blood cultures NGTD  Assessment and plan: Principal Problem:   Sepsis ([place]) Active Problems:   Lung abscess ([place])   Benign prostate hyperplasia   Parkinson's disease ([place])   Paroxysmal atrial fibrillation ([facility])   Obstructive sleep apnea   UTI (urinary tract infection)   DNR (do not resuscitate)   Fall during current hospitalization  End-of-life care/full comfort care/DNR/DNI -Appreciate help by palliative medicine-starting comfort care infusions and Ativan for agitation -Anticipate in hospital death  Sepsis due to catheter associated UTI and lung abscess vs mass  Urinary retention: Has history of chronic constipation.    History of Parkinson's disease/dementia/delirium/REM sleep disorder:   Unwitnessed fall in the hospital:   History of paroxysmal A-fib   Rectal wall thickening:   Dysphagia  Hypokalemia: Resolved.  Goal of care counseling: See above  Body mass index is 20.94 kg/m.           DVT prophylaxis:  Patient is full comfort  Code Status: DNR/DNI Family Communication: Updated  patient's spouse over the phone Level of care: Med-Surg Status is: Inpatient Remains inpatient appropriate because: End-of-life care   Final disposition: Anticipate in hospital death Consultants:  PCCM-signed off Palliative medicine  Sch Meds:  Scheduled Meds:  glycopyrrolate  0.3 mg  Subcutaneous Once   LORazepam  1 mg Intravenous Q4H   morphine CONCENTRATE  20 mg Oral Q4H   Continuous Infusions:  morphine     PRN Meds:.morphine  Antimicrobials: Anti-infectives (From admission, onward)    Start     Dose/Rate Route Frequency Ordered Stop   02/18/22 1030  amoxicillin-clavulanate (AUGMENTIN) 875-125 MG per tablet 1 tablet  Status:  Discontinued        1 tablet Oral Every 12 hours 02/18/22 0942 02/21/22 1128   02/18/22 0000  amoxicillin-clavulanate (AUGMENTIN) 875-125 MG tablet        1 tablet Oral 2 times daily 02/18/22 0830     02/16/22 1400  Ampicillin-Sulbactam (UNASYN) 3 g in sodium chloride 0.9 % 100 mL IVPB  Status:  Discontinued        3 g 200 mL/hr over 30 Minutes Intravenous Every 6 hours 02/16/22 1226 02/18/22 0942   02/15/22 2000  cefTRIAXone (ROCEPHIN) 1 g in sodium chloride 0.9 % 100 mL IVPB  Status:  Discontinued        1 g 200 mL/hr over 30 Minutes Intravenous Every 24 hours 02/15/22 1508 02/16/22 1130   02/14/22 2015  cefTRIAXone (ROCEPHIN) 2 g in sodium chloride 0.9 % 100 mL IVPB  Status:  Discontinued        2 g 200 mL/hr over 30 Minutes Intravenous Every 24 hours 02/14/22 2007 02/15/22 1507   02/14/22 2015  azithromycin (ZITHROMAX) 500 mg in sodium chloride 0.9 % 250 mL IVPB  Status:  Discontinued        500 mg 250 mL/hr over 60 Minutes Intravenous Every 24 hours 02/14/22 2007 02/15/22 1507   02/14/22 1715  ceFEPIme (MAXIPIME) 2 g in sodium chloride 0.9 % 100 mL IVPB        2 g 200 mL/hr over 30 Minutes Intravenous  Once 02/14/22 1700 02/14/22 1959        I have personally reviewed the following labs and images: CBC: Recent Labs  Lab 02/14/22 1701 02/14/22 2020 02/15/22 0730 02/17/22 0626 02/18/22 0559  WBC 17.8* 18.3* 17.4* 7.6 10.5  NEUTROABS 15.3*  --  14.8*  --   --   HGB 13.4 12.1* 12.5* 12.8* 14.5  HCT 39.8 35.7* 36.4* 37.5* 43.4  MCV 90.9 89.5 89.9 87.8 88.9  PLT 203 208 199 220 232   BMP &GFR Recent Labs  Lab  02/14/22 1701 02/14/22 2020 02/15/22 0855 02/16/22 0620 02/17/22 0626 02/18/22 0559 02/21/22 0819  NA 142  --  140  --  135 142  --   K 3.8  --  3.1*  --  3.4* 4.1  --   CL 107  --  108  --  102 106  --   CO2 24  --  25  --  25 26  --   GLUCOSE 115*  --  100*  --  102* 107*  --   BUN 22  --  14  --  12 17  --   CREATININE 0.85 0.81 0.77  --  0.77 0.78 0.88  CALCIUM 8.6*  --  7.5*  --  8.3* 8.8*  --   MG  --   --   --  2.3  --  2.2  --  Estimated Creatinine Clearance: 80 mL/min (by C-G formula based on SCr of 0.88 mg/dL). Liver & Pancreas: Recent Labs  Lab 02/14/22 1701 02/15/22 0855  AST 24 24  ALT 25 22  ALKPHOS 53 48  BILITOT 0.7 0.9  PROT 6.6 5.2*  ALBUMIN 3.3* 2.6*   No results for input(s): "LIPASE", "AMYLASE" in the last 168 hours. No results for input(s): "AMMONIA" in the last 168 hours. Diabetic: No results for input(s): "HGBA1C" in the last 72 hours. No results for input(s): "GLUCAP" in the last 168 hours. Cardiac Enzymes: No results for input(s): "CKTOTAL", "CKMB", "CKMBINDEX", "TROPONINI" in the last 168 hours. No results for input(s): "PROBNP" in the last 8760 hours. Coagulation Profile: Recent Labs  Lab 02/14/22 1701  INR 1.1   Thyroid Function Tests: No results for input(s): "TSH", "T4TOTAL", "FREET4", "T3FREE", "THYROIDAB" in the last 72 hours. Lipid Profile: No results for input(s): "CHOL", "HDL", "LDLCALC", "TRIG", "CHOLHDL", "LDLDIRECT" in the last 72 hours. Anemia Panel: No results for input(s): "VITAMINB12", "FOLATE", "FERRITIN", "TIBC", "IRON", "RETICCTPCT" in the last 72 hours. Urine analysis:    Component Value Date/Time   COLORURINE YELLOW 02/14/2022 1817   APPEARANCEUR HAZY (A) 02/14/2022 1817   LABSPEC 1.021 02/14/2022 1817   PHURINE 6.0 02/14/2022 1817   GLUCOSEU NEGATIVE 02/14/2022 1817   HGBUR MODERATE (A) 02/14/2022 1817   BILIRUBINUR NEGATIVE 02/14/2022 1817   KETONESUR 20 (A) 02/14/2022 1817   PROTEINUR 100 (A) 02/14/2022  1817   NITRITE POSITIVE (A) 02/14/2022 1817   LEUKOCYTESUR LARGE (A) 02/14/2022 1817   Sepsis Labs: Invalid input(s): "PROCALCITONIN", "LACTICIDVEN"  Microbiology: Recent Results (from the past 240 hour(s))  Blood Culture (routine x 2)     Status: None   Collection Time: 02/14/22  5:01 PM   Specimen: BLOOD  Result Value Ref Range Status   Specimen Description   Final    BLOOD RIGHT ANTECUBITAL Performed at [facility] [address] [place], [place] [address]    Special Requests   Final    BOTTLES DRAWN AEROBIC AND ANAEROBIC Blood Culture results may not be optimal due to an inadequate volume of blood received in culture bottles Performed at [facility] [address], [place], [place] [address]    Culture   Final    NO GROWTH 5 DAYS Performed at [facility], [place] [address], [place], [place] [address]    Report Status 02/19/2022 FINAL  Final  Blood Culture (routine x 2)     Status: None   Collection Time: 02/14/22  6:00 PM   Specimen: BLOOD  Result Value Ref Range Status   Specimen Description   Final    BLOOD BLOOD RIGHT FOREARM Performed at [facility] [address] [place], [place] [address]    Special Requests   Final    BOTTLES DRAWN AEROBIC AND ANAEROBIC Blood Culture results may not be optimal due to an inadequate volume of blood received in culture bottles Performed at [facility] [address], [place], [place] [address]    Culture   Final    NO GROWTH 5 DAYS Performed at [facility], [place] [address] [place], [place] [address]    Report Status 02/20/2022 FINAL  Final  Urine Culture     Status: Abnormal   Collection Time: 02/14/22  6:17 PM   Specimen: In/Out Cath Urine  Result Value Ref Range Status   Specimen Description   Final    IN/OUT CATH  URINE Performed at [facility], [place] [address], [place], [place] [address]    Special Requests    Final    NONE Performed at [facility], [place] [address] [place], [place] [address]    Culture >=100,000 COLONIES/mL ESCHERICHIA COLI (A)  Final   Report Status 02/16/2022 FINAL  Final   Organism ID, Bacteria ESCHERICHIA COLI (A)  Final      Susceptibility   Escherichia coli - MIC*    AMPICILLIN 8 SENSITIVE Sensitive     CEFAZOLIN <=4 SENSITIVE Sensitive     CEFEPIME <=0.12 SENSITIVE Sensitive     CEFTRIAXONE <=0.25 SENSITIVE Sensitive     CIPROFLOXACIN <=0.25 SENSITIVE Sensitive     GENTAMICIN <=1 SENSITIVE Sensitive     IMIPENEM <=0.25 SENSITIVE Sensitive     NITROFURANTOIN <=16 SENSITIVE Sensitive     TRIMETH/SULFA <=20 SENSITIVE Sensitive     AMPICILLIN/SULBACTAM <=2 SENSITIVE Sensitive     PIP/TAZO <=4 SENSITIVE Sensitive     * >=100,000 COLONIES/mL ESCHERICHIA COLI  Resp Panel by RT-PCR (Flu A&B, Covid) Anterior Nasal Swab     Status: None   Collection Time: 02/14/22  7:22 PM   Specimen: Anterior Nasal Swab  Result Value Ref Range Status   SARS Coronavirus 2 by RT PCR NEGATIVE NEGATIVE Final    Comment: (NOTE) SARS-CoV-2 target nucleic acids are NOT DETECTED.  The SARS-CoV-2 RNA is generally detectable in upper respiratory specimens during the acute phase of infection. The lowest concentration of SARS-CoV-2 viral copies this assay can detect is 138 copies/mL. A negative result does not preclude SARS-Cov-2 infection and should not be used as the sole basis for treatment or other patient management decisions. A negative result may occur with  improper specimen collection/handling, submission of specimen other than nasopharyngeal swab, presence of viral mutation(s) within the areas targeted by this assay, and inadequate number of viral copies(<138 copies/mL). A negative result must be combined with clinical observations, patient history, and epidemiological information. The expected result is Negative.  Fact Sheet for Patients:   EntrepreneurPulse.com.au  Fact Sheet for Healthcare Providers:  IncredibleEmployment.be  This test is no t yet approved or cleared by the [place] FDA and  has been authorized for detection and/or diagnosis of SARS-CoV-2 by FDA under an Emergency Use Authorization (EUA). This EUA will remain  in effect (meaning this test can be used) for the duration of the COVID-19 declaration under Section 564(b)(1) of the Act, 21 U.S.C.section 360bbb-3(b)(1), unless the authorization is terminated  or revoked sooner.       Influenza A by PCR NEGATIVE NEGATIVE Final   Influenza B by PCR NEGATIVE NEGATIVE Final    Comment: (NOTE) The Xpert Xpress SARS-CoV-2/FLU/RSV plus assay is intended as an aid in the diagnosis of influenza from Nasopharyngeal swab specimens and should not be used as a sole basis for treatment. Nasal washings and aspirates are unacceptable for Xpert Xpress SARS-CoV-2/FLU/RSV testing.  Fact Sheet for Patients: EntrepreneurPulse.com.au  Fact Sheet for Healthcare Providers: IncredibleEmployment.be  This test is not yet approved or cleared by the [place] FDA and has been authorized for detection and/or diagnosis of SARS-CoV-2 by FDA under an Emergency Use Authorization (EUA). This EUA will remain in effect (meaning this test can be used) for the duration of the COVID-19 declaration under Section 564(b)(1) of the Act, 21 U.S.C. section 360bbb-3(b)(1), unless the authorization is terminated or revoked.  Performed at [facility], [place] [address], [place], [place] [address]  Radiology Studies: No results found.    Makela Niehoff [place]  If 7PM-7AM, please contact night-coverage [email or web address] 02/21/2022, 2:08 PM

## 2022-02-22 DIAGNOSIS — Z515 Encounter for palliative care: Secondary | ICD-10-CM | POA: Diagnosis not present

## 2022-02-22 DIAGNOSIS — A4151 Sepsis due to Escherichia coli [E. coli]: Secondary | ICD-10-CM | POA: Diagnosis not present

## 2022-02-22 MED ORDER — LORAZEPAM 2 MG/ML IJ SOLN: 1.0000 mg | INTRAMUSCULAR | 0 refills | Status: DC | PRN

## 2022-02-22 MED ORDER — GLYCOPYRROLATE 1 MG/5ML PO SOLN: 1.0000 mg | Freq: Four times a day (QID) | ORAL | 0 refills | Status: AC | PRN

## 2022-02-22 MED ORDER — HYDROMORPHONE HCL-NACL 0.5-0.9 MG/ML-% IV SOSY
1.0000 mg | PREFILLED_SYRINGE | INTRAVENOUS | 0 refills | Status: DC | PRN
Start: 2022-02-22 — End: 2023-06-21

## 2022-02-22 MED ORDER — ONDANSETRON 4 MG PO TBDP: 4.0000 mg | ORAL_TABLET | Freq: Three times a day (TID) | ORAL | 0 refills | Status: DC | PRN

## 2022-02-22 NOTE — TOC Progression Note (Addendum)
Transition of Care [facility]) - Progression Note    Patient Details  Name: TAJAI IHDE MRN: [id number] Date of Birth: 1945/11/25  [facility] CM/SW Contact  Ross Ludwig, [place] Phone Number: 02/22/2022, 10:53 AM  Clinical Narrative:     CSW was informed that patient has stabilized and can go to hospice facility.  CSW contacted patient's spouse to discuss which facility he would like, he chose [facility].  CSW gave referral to Freddie Breech at [facility] for [facility].  Anderson Malta to review and let CSW know if they can accept  11:45am  CSW was informed by [facility] they have a bed available for patient, and can accept today once consents are completed.  CSW updated bedside nurse, attending physician, and charge nurse.  Patient's spouse is aware that he can go today.   Expected Discharge Plan: Home/Self Care Barriers to Discharge: Continued Medical Work up  Expected Discharge Plan and Services Expected Discharge Plan: Home/Self Care       Living arrangements for the past 2 months: Single Family Home Expected Discharge Date: 02/18/22                                     Social Determinants of Health (SDOH) Interventions    Readmission Risk Interventions     No data to display

## 2022-02-22 NOTE — Progress Notes (Signed)
Chaplain engaged in an initial visit with Ryan Woodward, who was resting peacefully.  Chaplain was able to introduce herself to [place] friend, Elmo Putt, at his bedside.  She voiced that Rogue has been agitated through the day today.  Chaplain didn't want to disturb him from his sleep.  Chaplain offered support and checked in to see Elmo Putt needed anything.   Chaplain offered a compassionate presence and support. Chaplain is available to follow-up as needed.    02/22/22 1300  Clinical Encounter Type  Visited With Patient and family together;Patient not available  Visit Type Spiritual support

## 2022-02-22 NOTE — Progress Notes (Signed)
Pt d/c with [facility]. Upon discharge, morphine infusion was pulled and wasted with Currie Paris, RN as witness. 41m wasted per protocol.

## 2022-02-22 NOTE — Discharge Summary (Signed)
Physician Discharge Summary  Ryan Woodward YKD:[id number] DOB: [birth year] DOA: 02/14/2022  PCP: Lawerance Cruel, MD  Admit date: 02/14/2022 Discharge date: 02/22/2022 Admitted From: Home Disposition: Residential hospice at [facility] for end-of-life care   Discharge Condition: Stable for transfer CODE STATUS: DNR/DNI  Follow-up Information     Lawerance Cruel, MD Follow up in 1 week(s).   Specialty: Family Medicine Contact information: [place] [address] [phone]         Burnell Blanks, MD .   Specialty: Cardiology Contact information: [address] [place] [address] [phone]                 Hospital course 76 year old M with PMH of Parkinson disease, dementia, REM sleep disorder, urinary retention, and paroxysmal A-fib returning to ED with progressive weakness and multiple episodes of watery diarrhea and abdominal discomfort and admitted for sepsis due to catheter associated UTI and possible lung abscess.  Patient was seen in ED 3 days prior and had urine retention for which Foley catheter was placed and discharged home.  In ED, febrile to 102.3.  Tachycardic and tachypneic.  UA consistent with UTI.  CXR with peripheral wedge-shaped consolidation within lateral right mid chest compatible with bronchopneumonia.  CTA chest showed 4.5 x 3.3 cm masslike consolidation within right upper lobe concerning for developing abscess versus malignancy.  CT abdomen and pelvis showed rectal wall thickening and aortic atherosclerosis.  Initially started on cefepime and azithromycin.  Antibiotic de-escalated to ceftriaxone then IV Unasyn.  Pulmonology consulted by the spouse not interested in invasive diagnostic evaluation.  PCCM recommended antibiotic for 2 weeks and outpatient follow-up for repeat imaging.  Therapy recommended SNF.   However, patient had persistent delirium and confusion.  After discussion with family, we  have agreed that patient would not benefit from therapy since he cannot follow commands.  We felt patient would continue to decline due to his severe dementia.  Palliative medicine was consulted, and he was transitioned to full comfort care on 7/23.  He is transferred to [facility] for end-of-life care on 7/24.  See individual problem list below for more.   Problems addressed during this hospitalization Principal Problem:   Sepsis ([place]) Active Problems:   Lung abscess ([place])   Benign prostate hyperplasia   Parkinson's disease ([place])   Paroxysmal atrial fibrillation ([place])   Obstructive sleep apnea   UTI (urinary tract infection)   DNR (do not resuscitate)   Fall during current hospitalization   End-of-life care/full comfort care/DNR/DNI -Transfer to [facility] for end-of-life care. -Stable for transfer   Sepsis due to catheter associated UTI and lung abscess vs mass   Urinary retention: Has history of chronic constipation.    History of Parkinson's disease/dementia/delirium/REM sleep disorder:    Unwitnessed fall in the hospital:   History of paroxysmal A-fib   Rectal wall thickening:    Dysphagia   Hypokalemia: Resolved.   Goal of care counseling: See above     Vital signs Vitals:   02/20/22 2053 02/21/22 0500 02/21/22 2200 02/22/22 0550  BP: (!) 165/87 Comment: Pt agitated (!) 115/99 138/66 130/69  Pulse: 98 78 77 75  Temp: 98.1 F (36.7 C) 98.4 F (36.9 C) 97.8 F (36.6 C) 97.7 F (36.5 C)  Resp: (!) '22 18 18 20  '$ Height:      Weight:      SpO2: 96% 98% 95% 96%  TempSrc: Oral Oral Axillary Axillary  BMI (Calculated):         Discharge exam  GENERAL: Resting comfortably. HEENT: Intermittent spontaneous eye opening RESP: On room air.  No IWOB.  Fair aeration bilaterally. CVS:  RRR. Heart sounds normal.  MSK/EXT:  No apparent deformity. No edema.  SKIN: no apparent skin lesion or wound NEURO: Sleepy but intermittently opens his eyes. PSYCH: Calm.   No distress or agitation.  Discharge Instructions  Allergies as of 02/22/2022   No Known Allergies      Medication List     STOP taking these medications    acetaminophen 325 MG tablet Commonly known as: TYLENOL   carbidopa-levodopa 25-100 MG tablet Commonly known as: SINEMET IR   clonazePAM 0.5 MG tablet Commonly known as: KLONOPIN   donepezil 10 MG tablet Commonly known as: ARICEPT   fluticasone 50 MCG/ACT nasal spray Commonly known as: FLONASE   MULTIVITAMIN PO   QUEtiapine 25 MG tablet Commonly known as: SEROquel   sildenafil 20 MG tablet Commonly known as: REVATIO       TAKE these medications    Glycopyrrolate 1 MG/5ML Soln Take 5 mLs (1 mg total) by mouth 4 (four) times daily as needed for up to 3 days.   HYDROmorphone 0.5 MG/ML injection Commonly known as: DILAUDID Inject 2 mLs (1 mg total) into the vein every hour as needed for severe pain or moderate pain (shortness of breath).   LORazepam 2 MG/ML injection Commonly known as: ATIVAN Inject 0.5 mLs (1 mg total) into the vein every 4 (four) hours as needed.   ondansetron 4 MG disintegrating tablet Commonly known as: ZOFRAN-ODT Take 1 tablet (4 mg total) by mouth every 8 (eight) hours as needed for nausea or vomiting.        Consultations: Pulmonology Palliative medicine  Procedures/Studies:   DG Swallowing Func-Speech Pathology  Result Date: 02/16/2022 Table formatting from the original result was not included. Objective Swallowing Evaluation: Type of Study: Bedside Swallow Evaluation  Patient Details Name: Ryan Woodward MRN: [id number] Date of Birth: 1946-04-26 Today's Date: 02/16/2022 Time: SLP Start Time (ACUTE ONLY): 51 -SLP Stop Time (ACUTE ONLY): 1430 SLP Time Calculation (min) (ACUTE ONLY): 20 min Past Medical History: Past Medical History: Diagnosis Date  A-fib ([place])   isolated episode during colonoscopy  Allergies   Back pain   Bladder cancer ([place]) dx aug'12  s/p turbt  Enlarged  prostate with urinary obstruction   nocturia/ freq/ dysuria  GERD (gastroesophageal reflux disease)   OSA on CPAP   wears CPAP nightly  PD (Parkinson's disease) ([place])   Skin cancer of nose 05/2018  basal cell Past Surgical History: Past Surgical History: Procedure Laterality Date  CERVICAL FUSION  2000  C4-5  COLONOSCOPY    CYSTOSCOPY  06/16/2011  Procedure: CYSTOSCOPY;  Surgeon: Molli Hazard, MD;  Location: [facility];  Service: Urology;  Laterality: N/A;  CYSTOSCOPY WITH URETHRAL DILATATION  w/ TURBT BX   05-04-11  INGUINAL HERNIA REPAIR Bilateral 02/07/2019  Procedure: BILATERAL INGUINAL HERNIA REPAIR WITH MESH;  Surgeon: Donnie Mesa, MD;  Location: [facility];  Service: General;  Laterality: Bilateral;  GENERAL AND TAP BLOCK ANESTHESIA  INSERTION OF MESH Bilateral 02/07/2019  Procedure: INSERTION OF MESH;  Surgeon: Donnie Mesa, MD;  Location: [facility];  Service: General;  Laterality: Bilateral;  SINUS SURGERY    TRANSURETHRAL RESECTION OF PROSTATE  06/16/2011  Procedure: TRANSURETHRAL RESECTION OF THE PROSTATE (TURP);  Surgeon: Molli Hazard, MD;  Location: [facility];  Service: Urology;  Laterality: N/A;  URETHRAL DILATATION, MEATOTOMY, PENIL BLOCK HPI: 76 yo man with medical history of parkinson's disease, GERD, dementia, ACDF who presents weakness and confusion at home. Preliminary work up  concerning for sepsis from UTI and PE study was negative for PE. Found to have RUL lung mass vs pulmonary abscess.  No data recorded  Recommendations for follow up therapy are one component of a multi-disciplinary discharge planning process, led by the attending physician.  Recommendations may be updated based on patient status, additional functional criteria and insurance authorization. Assessment / Plan / Recommendation   02/16/2022   3:33 PM Clinical Impressions Clinical Impression Pt demonstrates a mild pharyngeal dysphagia and suspected  esophageal involvement marked by reduced tongue base retraction and pharyngeal contraction. Consecutive straw sips resulted in laryngeal penetration with thin barium (PAS 3) and with smaller sips there was one instance of penetration to vocal cords briefly (PAS 5). Vallecular and pyriform sinus residue ranged from min-moderate with pyriform sinus residue attempting to spill over interarytenoid space on several occasions. Pt senses residue and performs spontaneous swallows that help to reduce volumes. Pt managed to masticate cracker thoroughly and transit. Esophageal scan revealed barium pill stopped mid esophagus and not propelled to distal esophagus with applesauce then into GE junction with thin. Given his confusion, decreased endurance, recommend Dys 2 texture, thin liquids, straws allowed but must take small sips and person assisting to feed should remove straw after 1 or 2 sips. Crush meds, multiple swallows and intermittent throat clear. ST will contiue to follow. SLP Visit Diagnosis Dysphagia, pharyngeal phase (R13.13) Impact on safety and function Mild aspiration risk;Moderate aspiration risk     02/16/2022   3:33 PM Treatment Recommendations Treatment Recommendations Therapy as outlined in treatment plan below     02/16/2022   3:33 PM Prognosis Prognosis for Safe Diet Advancement Good Barriers to Reach Goals Cognitive deficits   02/16/2022   3:33 PM Diet Recommendations SLP Diet Recommendations Thin liquid;Dysphagia 2 (Fine chop) solids Liquid Administration via Cup;Straw Medication Administration Crushed with puree Compensations Slow rate;Small sips/bites;Clear throat intermittently;Multiple dry swallows after each bite/sip Postural Changes Seated upright at 90 degrees;Remain semi-upright after after feeds/meals (Comment)     02/16/2022   3:33 PM Other Recommendations Oral Care Recommendations Oral care BID Follow Up Recommendations -- Assistance recommended at discharge Frequent or constant  Supervision/Assistance Functional Status Assessment Patient has had a recent decline in their functional status and demonstrates the ability to make significant improvements in function in a reasonable and predictable amount of time.   02/16/2022   3:33 PM Frequency and Duration  Speech Therapy Frequency (ACUTE ONLY) min 2x/week Treatment Duration 2 weeks     02/16/2022   3:33 PM Oral Phase Oral Phase [facility]    02/16/2022   3:33 PM Pharyngeal Phase Pharyngeal Phase Impaired Pharyngeal- Thin Teaspoon Pharyngeal residue - valleculae;Pharyngeal residue - pyriform Pharyngeal- Thin Cup Pharyngeal residue - valleculae;Pharyngeal residue - pyriform Pharyngeal- Thin Straw Penetration/Aspiration during swallow;Pharyngeal residue - pyriform;Pharyngeal residue - valleculae Pharyngeal Material enters airway, remains ABOVE vocal cords then ejected out;Material enters airway, remains ABOVE vocal cords and not ejected out;Material enters airway, CONTACTS cords and not ejected out Pharyngeal- Puree Pharyngeal residue - valleculae;Reduced pharyngeal peristalsis Pharyngeal- Regular Pharyngeal residue - valleculae;Reduced tongue base retraction    02/16/2022   3:33 PM Cervical Esophageal Phase  Cervical Esophageal Phase [facility]FL [place] Siren 02/16/2022, 3:57 PM  CT Angio Chest Pulmonary Embolism (PE) W or WO Contrast  Result Date: 02/14/2022 CLINICAL DATA:  Concern for pulmonary embolism and mesenteric ischemia. EXAM: CT ANGIOGRAPHY CHEST, ABDOMEN AND PELVIS TECHNIQUE: Multidetector CT imaging through the chest, abdomen and pelvis was performed using the standard protocol during bolus administration of intravenous contrast. Multiplanar reconstructed images and MIPs were obtained and reviewed to evaluate the vascular anatomy. RADIATION DOSE REDUCTION: This exam was performed according to the departmental dose-optimization program which includes automated exposure control, adjustment of the mA and/or kV according to  patient size and/or use of iterative reconstruction technique. CONTRAST:  181m OMNIPAQUE IOHEXOL 350 MG/ML SOLN COMPARISON:  Chest radiograph dated 02/14/2022 and CT abdomen pelvis dated 04/27/2013. FINDINGS: CTA CHEST FINDINGS Cardiovascular: There is no cardiomegaly or pericardial effusion. Mild atherosclerotic calcification of the aortic arch. No aneurysmal dilatation or dissection. The origins of the great vessels of the aortic arch appear patent. Evaluation of the pulmonary arteries is limited due to respiratory motion and suboptimal visualization of the peripheral branches. No pulmonary artery embolus identified. Mediastinum/Nodes: Mildly enlarged right hilar lymph node measures 11 mm. No mediastinal adenopathy. The esophagus is grossly unremarkable no mediastinal fluid collection. Lungs/Pleura: There is a 4.5 x 3.3 cm masslike consolidation with central low attenuating/nonenhancing content in the peripheral right upper lobe. This may represent a focal infection with necrotic content or developing abscess. A malignancy is not entirely excluded. If there is clinical findings of infection, follow-up with CT after treatment recommended to document resolution. If no evidence of infection, multidisciplinary consult is advised to evaluate for possibility of malignancy. There are bibasilar subpleural atelectasis. A 7 mm partially calcified nodule in the left apex (30/2) may represent a partially calcified granuloma. Attention on follow-up imaging recommended. There is no pleural effusion pneumothorax. The central airways are patent. Musculoskeletal: No acute osseous pathology. Review of the MIP images confirms the above findings. CTA ABDOMEN AND PELVIS FINDINGS VASCULAR Aorta: Moderate atherosclerotic calcification. There is a 2.3 cm infrarenal aortic ectasia Celiac: There is atherosclerotic calcification of the origin celiac trunk. The celiac axis and its major branches are patent. SMA: The SMA is patent. Renals:  Both renal arteries are patent without evidence of aneurysm, dissection, vasculitis, fibromuscular dysplasia or significant stenosis. IMA: Patent without evidence of aneurysm, dissection, vasculitis or significant stenosis. Inflow: Mild atherosclerotic calcification. No aneurysmal dilatation or dissection. Veins: No obvious venous abnormality within the limitations of this arterial phase study. Review of the MIP images confirms the above findings. NON-VASCULAR No intra-abdominal free air or free fluid. Hepatobiliary: The liver is unremarkable no intrahepatic biliary dilatation. The gallbladder is unremarkable. Pancreas: Unremarkable. No pancreatic ductal dilatation or surrounding inflammatory changes. Spleen: Normal in size without focal abnormality. Adrenals/Urinary Tract: The adrenal glands unremarkable. Faint small right renal upper pole hypodense focus is not characterized, likely a cyst. There is no hydronephrosis on either side. The visualized ureters appear unremarkable. Mild diffuse trabeculated appearance of the bladder wall, likely related to chronic bladder outlet obstruction. Correlation with urinalysis recommended to exclude superimposed cystitis. Stomach/Bowel: There is thickened appearance of the rectal wall with mild perirectal edema which is suboptimally evaluated. A mildly enlarged and rounded left posterior perirectal lymph node (230/11) measures 11 mm. Although this may be inflammatory or infectious in etiology such as stercoral colitis, underlying malignancy is a concern. Correlation with clinical exam and direct visualization recommended. There is moderate stool throughout the colon. No bowel obstruction. The appendix is normal. Lymphatic: No retroperitoneal or mesenteric adenopathy. Left posterior perirectal  mildly enlarged lymph node measuring 11 mm. Reproductive: Mild enlargement of the prostate gland. Other: None Musculoskeletal: Degenerative changes of the spine. No acute osseous  pathology. Review of the MIP images confirms the above findings. IMPRESSION: 1. No CT evidence of pulmonary artery embolus or aortic dissection. 2. Right upper lobe mass as above. Findings may represent pneumonia but concerning for malignancy. Clinical correlation and close follow-up to resolution or multi facility consult is advised. Please see above discussion. 3. Thickened rectal wall with left posterior perirectal mildly enlarged lymph nodes. Findings may be inflammatory/infectious but concerning for malignancy. Correlation with clinical exam and direct visualization recommended. 4. Mildly enlarged right hilar lymph node. 5. No bowel obstruction. Normal appendix. 6. Aortic Atherosclerosis (ICD10-I70.0). Electronically Signed   By: Anner Crete M.D.   On: 02/14/2022 23:18   CT Angio Abd/Pel w/ and/or w/o  Result Date: 02/14/2022 CLINICAL DATA:  Concern for pulmonary embolism and mesenteric ischemia. EXAM: CT ANGIOGRAPHY CHEST, ABDOMEN AND PELVIS TECHNIQUE: Multidetector CT imaging through the chest, abdomen and pelvis was performed using the standard protocol during bolus administration of intravenous contrast. Multiplanar reconstructed images and MIPs were obtained and reviewed to evaluate the vascular anatomy. RADIATION DOSE REDUCTION: This exam was performed according to the departmental dose-optimization program which includes automated exposure control, adjustment of the mA and/or kV according to patient size and/or use of iterative reconstruction technique. CONTRAST:  145m OMNIPAQUE IOHEXOL 350 MG/ML SOLN COMPARISON:  Chest radiograph dated 02/14/2022 and CT abdomen pelvis dated 04/27/2013. FINDINGS: CTA CHEST FINDINGS Cardiovascular: There is no cardiomegaly or pericardial effusion. Mild atherosclerotic calcification of the aortic arch. No aneurysmal dilatation or dissection. The origins of the great vessels of the aortic arch appear patent. Evaluation of the pulmonary arteries is limited due to  respiratory motion and suboptimal visualization of the peripheral branches. No pulmonary artery embolus identified. Mediastinum/Nodes: Mildly enlarged right hilar lymph node measures 11 mm. No mediastinal adenopathy. The esophagus is grossly unremarkable no mediastinal fluid collection. Lungs/Pleura: There is a 4.5 x 3.3 cm masslike consolidation with central low attenuating/nonenhancing content in the peripheral right upper lobe. This may represent a focal infection with necrotic content or developing abscess. A malignancy is not entirely excluded. If there is clinical findings of infection, follow-up with CT after treatment recommended to document resolution. If no evidence of infection, multidisciplinary consult is advised to evaluate for possibility of malignancy. There are bibasilar subpleural atelectasis. A 7 mm partially calcified nodule in the left apex (30/2) may represent a partially calcified granuloma. Attention on follow-up imaging recommended. There is no pleural effusion pneumothorax. The central airways are patent. Musculoskeletal: No acute osseous pathology. Review of the MIP images confirms the above findings. CTA ABDOMEN AND PELVIS FINDINGS VASCULAR Aorta: Moderate atherosclerotic calcification. There is a 2.3 cm infrarenal aortic ectasia Celiac: There is atherosclerotic calcification of the origin celiac trunk. The celiac axis and its major branches are patent. SMA: The SMA is patent. Renals: Both renal arteries are patent without evidence of aneurysm, dissection, vasculitis, fibromuscular dysplasia or significant stenosis. IMA: Patent without evidence of aneurysm, dissection, vasculitis or significant stenosis. Inflow: Mild atherosclerotic calcification. No aneurysmal dilatation or dissection. Veins: No obvious venous abnormality within the limitations of this arterial phase study. Review of the MIP images confirms the above findings. NON-VASCULAR No intra-abdominal free air or free fluid.  Hepatobiliary: The liver is unremarkable no intrahepatic biliary dilatation. The gallbladder is unremarkable. Pancreas: Unremarkable. No pancreatic ductal dilatation or surrounding inflammatory changes. Spleen: Normal in size without  focal abnormality. Adrenals/Urinary Tract: The adrenal glands unremarkable. Faint small right renal upper pole hypodense focus is not characterized, likely a cyst. There is no hydronephrosis on either side. The visualized ureters appear unremarkable. Mild diffuse trabeculated appearance of the bladder wall, likely related to chronic bladder outlet obstruction. Correlation with urinalysis recommended to exclude superimposed cystitis. Stomach/Bowel: There is thickened appearance of the rectal wall with mild perirectal edema which is suboptimally evaluated. A mildly enlarged and rounded left posterior perirectal lymph node (230/11) measures 11 mm. Although this may be inflammatory or infectious in etiology such as stercoral colitis, underlying malignancy is a concern. Correlation with clinical exam and direct visualization recommended. There is moderate stool throughout the colon. No bowel obstruction. The appendix is normal. Lymphatic: No retroperitoneal or mesenteric adenopathy. Left posterior perirectal mildly enlarged lymph node measuring 11 mm. Reproductive: Mild enlargement of the prostate gland. Other: None Musculoskeletal: Degenerative changes of the spine. No acute osseous pathology. Review of the MIP images confirms the above findings. IMPRESSION: 1. No CT evidence of pulmonary artery embolus or aortic dissection. 2. Right upper lobe mass as above. Findings may represent pneumonia but concerning for malignancy. Clinical correlation and close follow-up to resolution or multi facility consult is advised. Please see above discussion. 3. Thickened rectal wall with left posterior perirectal mildly enlarged lymph nodes. Findings may be inflammatory/infectious but concerning for  malignancy. Correlation with clinical exam and direct visualization recommended. 4. Mildly enlarged right hilar lymph node. 5. No bowel obstruction. Normal appendix. 6. Aortic Atherosclerosis (ICD10-I70.0). Electronically Signed   By: Anner Crete M.D.   On: 02/14/2022 23:18   DG Chest Port 1 View  Result Date: 02/14/2022 CLINICAL DATA:  Sepsis, weakness, diarrhea EXAM: PORTABLE CHEST 1 VIEW COMPARISON:  None Available. FINDINGS: Single frontal view of the chest demonstrates an unremarkable cardiac silhouette. Peripheral wedge-shaped consolidation within the right mid chest could reflect bronchopneumonia. No effusion or pneumothorax. No acute bony abnormalities. IMPRESSION: 1. Peripheral wedge-shaped consolidation within the lateral right mid chest, compatible with bronchopneumonia in the appropriate setting. Followup PA and lateral chest X-ray is recommended in 3-4 weeks following trial of antibiotic therapy to ensure resolution and exclude underlying malignancy. Electronically Signed   By: Randa Ngo M.D.   On: 02/14/2022 17:26   DG Foot Complete Left  Result Date: 02/05/2022 CLINICAL DATA:  Pain, "pain in bottom of left foot gradually gotten worse where I cannot bear weight on it" EXAM: LEFT FOOT - COMPLETE 3 VIEW COMPARISON:  None Available. FINDINGS: There is no evidence of fracture or dislocation. There is 1.2 x 0.5 cm accessory bone seen immediately lateral to the cuboid bone likely an os peroneum. There is no evidence of arthropathy or other focal bone abnormality. Soft tissues are unremarkable. IMPRESSION: No fracture or dislocation.  Os peroneum. Electronically Signed   By: Frazier Richards M.D.   On: 02/05/2022 09:54       The results of significant diagnostics from this hospitalization (including imaging, microbiology, ancillary and laboratory) are listed below for reference.     Microbiology: Recent Results (from the past 240 hour(s))  Blood Culture (routine x 2)     Status: None    Collection Time: 02/14/22  5:01 PM   Specimen: BLOOD  Result Value Ref Range Status   Specimen Description   Final    BLOOD RIGHT ANTECUBITAL Performed at [facility] [address], [place], [place] [address]    Special Requests   Final    BOTTLES  DRAWN AEROBIC AND ANAEROBIC Blood Culture results may not be optimal due to an inadequate volume of blood received in culture bottles Performed at [facility], [place] [address] [place], [place] [address]    Culture   Final    NO GROWTH 5 DAYS Performed at [facility], [place] [address] [place], [place] [address]    Report Status 02/19/2022 FINAL  Final  Blood Culture (routine x 2)     Status: None   Collection Time: 02/14/22  6:00 PM   Specimen: BLOOD  Result Value Ref Range Status   Specimen Description   Final    BLOOD BLOOD RIGHT FOREARM Performed at [facility] [address], [place], [place] [address]    Special Requests   Final    BOTTLES DRAWN AEROBIC AND ANAEROBIC Blood Culture results may not be optimal due to an inadequate volume of blood received in culture bottles Performed at [facility] [address] [place], [place] [address]    Culture   Final    NO GROWTH 5 DAYS Performed at [facility], [place] [address] [place], [place] [address]    Report Status 02/20/2022 FINAL  Final  Urine Culture     Status: Abnormal   Collection Time: 02/14/22  6:17 PM   Specimen: In/Out Cath Urine  Result Value Ref Range Status   Specimen Description   Final    IN/OUT CATH URINE Performed at [facility] [address] [place], [place] [address]    Special Requests   Final    NONE Performed at [facility], [place] [address], [place], [place] [address]    Culture >=100,000 COLONIES/mL ESCHERICHIA COLI (A)  Final   Report Status 02/16/2022 FINAL  Final   Organism ID, Bacteria ESCHERICHIA COLI (A)   Final      Susceptibility   Escherichia coli - MIC*    AMPICILLIN 8 SENSITIVE Sensitive     CEFAZOLIN <=4 SENSITIVE Sensitive     CEFEPIME <=0.12 SENSITIVE Sensitive     CEFTRIAXONE <=0.25 SENSITIVE Sensitive     CIPROFLOXACIN <=0.25 SENSITIVE Sensitive     GENTAMICIN <=1 SENSITIVE Sensitive     IMIPENEM <=0.25 SENSITIVE Sensitive     NITROFURANTOIN <=16 SENSITIVE Sensitive     TRIMETH/SULFA <=20 SENSITIVE Sensitive     AMPICILLIN/SULBACTAM <=2 SENSITIVE Sensitive     PIP/TAZO <=4 SENSITIVE Sensitive     * >=100,000 COLONIES/mL ESCHERICHIA COLI  Resp Panel by RT-PCR (Flu A&B, Covid) Anterior Nasal Swab     Status: None   Collection Time: 02/14/22  7:22 PM   Specimen: Anterior Nasal Swab  Result Value Ref Range Status   SARS Coronavirus 2 by RT PCR NEGATIVE NEGATIVE Final    Comment: (NOTE) SARS-CoV-2 target nucleic acids are NOT DETECTED.  The SARS-CoV-2 RNA is generally detectable in upper respiratory specimens during the acute phase of infection. The lowest concentration of SARS-CoV-2 viral copies this assay can detect is 138 copies/mL. A negative result does not preclude SARS-Cov-2 infection and should not be used as the sole basis for treatment or other patient management decisions. A negative result may occur with  improper specimen collection/handling, submission of specimen other than nasopharyngeal swab, presence of viral mutation(s) within the areas targeted by this assay, and inadequate number of viral copies(<138 copies/mL). A negative result must be combined with clinical observations, patient history, and epidemiological information. The expected result is Negative.  Fact  Sheet for Patients:  EntrepreneurPulse.com.au  Fact Sheet for Healthcare Providers:  IncredibleEmployment.be  This test is no t yet approved or cleared by the [place] FDA and  has been authorized for detection and/or diagnosis of SARS-CoV-2 by FDA  under an Emergency Use Authorization (EUA). This EUA will remain  in effect (meaning this test can be used) for the duration of the COVID-19 declaration under Section 564(b)(1) of the Act, 21 U.S.C.section 360bbb-3(b)(1), unless the authorization is terminated  or revoked sooner.       Influenza A by PCR NEGATIVE NEGATIVE Final   Influenza B by PCR NEGATIVE NEGATIVE Final    Comment: (NOTE) The Xpert Xpress SARS-CoV-2/FLU/RSV plus assay is intended as an aid in the diagnosis of influenza from Nasopharyngeal swab specimens and should not be used as a sole basis for treatment. Nasal washings and aspirates are unacceptable for Xpert Xpress SARS-CoV-2/FLU/RSV testing.  Fact Sheet for Patients: EntrepreneurPulse.com.au  Fact Sheet for Healthcare Providers: IncredibleEmployment.be  This test is not yet approved or cleared by the [place] FDA and has been authorized for detection and/or diagnosis of SARS-CoV-2 by FDA under an Emergency Use Authorization (EUA). This EUA will remain in effect (meaning this test can be used) for the duration of the COVID-19 declaration under Section 564(b)(1) of the Act, 21 U.S.C. section 360bbb-3(b)(1), unless the authorization is terminated or revoked.  Performed at [facility], [place] [address], [place], [place] [address]      Labs:  CBC: Recent Labs  Lab 02/17/22 0626 02/18/22 0559  WBC 7.6 10.5  HGB 12.8* 14.5  HCT 37.5* 43.4  MCV 87.8 88.9  PLT 220 232   BMP &GFR Recent Labs  Lab 02/16/22 0620 02/17/22 0626 02/18/22 0559 02/21/22 0819  NA  --  135 142  --   K  --  3.4* 4.1  --   CL  --  102 106  --   CO2  --  25 26  --   GLUCOSE  --  102* 107*  --   BUN  --  12 17  --   CREATININE  --  0.77 0.78 0.88  CALCIUM  --  8.3* 8.8*  --   MG 2.3  --  2.2  --    Estimated Creatinine Clearance: 80 mL/min (by C-G formula based on SCr of 0.88 mg/dL). Liver & Pancreas: No  results for input(s): "AST", "ALT", "ALKPHOS", "BILITOT", "PROT", "ALBUMIN" in the last 168 hours. No results for input(s): "LIPASE", "AMYLASE" in the last 168 hours. No results for input(s): "AMMONIA" in the last 168 hours. Diabetic: No results for input(s): "HGBA1C" in the last 72 hours. No results for input(s): "GLUCAP" in the last 168 hours. Cardiac Enzymes: No results for input(s): "CKTOTAL", "CKMB", "CKMBINDEX", "TROPONINI" in the last 168 hours. No results for input(s): "PROBNP" in the last 8760 hours. Coagulation Profile: No results for input(s): "INR", "PROTIME" in the last 168 hours. Thyroid Function Tests: No results for input(s): "TSH", "T4TOTAL", "FREET4", "T3FREE", "THYROIDAB" in the last 72 hours. Lipid Profile: No results for input(s): "CHOL", "HDL", "LDLCALC", "TRIG", "CHOLHDL", "LDLDIRECT" in the last 72 hours. Anemia Panel: No results for input(s): "VITAMINB12", "FOLATE", "FERRITIN", "TIBC", "IRON", "RETICCTPCT" in the last 72 hours. Urine analysis:    Component Value Date/Time   COLORURINE YELLOW 02/14/2022 1817   APPEARANCEUR HAZY (A) 02/14/2022 1817   LABSPEC 1.021 02/14/2022 1817   PHURINE 6.0 02/14/2022 1817   GLUCOSEU NEGATIVE 02/14/2022 1817   HGBUR MODERATE (A)  02/14/2022 1817   BILIRUBINUR NEGATIVE 02/14/2022 1817   KETONESUR 20 (A) 02/14/2022 1817   PROTEINUR 100 (A) 02/14/2022 1817   NITRITE POSITIVE (A) 02/14/2022 1817   LEUKOCYTESUR LARGE (A) 02/14/2022 1817   Sepsis Labs: Invalid input(s): "PROCALCITONIN", "LACTICIDVEN"   SIGNED:  Mercy Riding, MD  [facility] 02/22/2022, 11:58 AM

## 2022-02-22 NOTE — TOC Transition Note (Addendum)
Transition of Care [facility]) - CM/SW Discharge Note   Patient Details  Name: Ryan Woodward MRN: [id number] Date of Birth: 1946/03/07  [facility] CM/SW Contact:  Ross Ludwig, LCSW Phone Number: 02/22/2022, 12:20 PM   Clinical Narrative:    CSW was informed that patient has a bed available today at [facility].  Once consent forms are signed CSW will call EMS.  Patient to be d/c'ed today to [facility].  Patient and family agreeable to plans will transport via ems RN to call report to [phone].    Final next level of care: [place] Barriers to Discharge: Barriers Resolved   Patient Goals and CMS Choice Patient states their goals for this hospitalization and ongoing recovery are:: To go to [facility] for end of life care. CMS Medicare.gov Compare Post Acute Care list provided to:: Patient Represenative (must comment) Choice offered to / list presented to : Spouse  Discharge Placement              Patient chooses bed at: Other - please specify in the comment section below: ([facility]) Patient to be transferred to facility by: [facility] Name of family member notified: Spouse Cletus Gash Patient and family notified of of transfer: 02/22/22  Discharge Plan and [place] for end of life care.                                   Social Determinants of Health (SDOH) Interventions     Readmission Risk Interventions     No data to display

## 2022-02-22 NOTE — Progress Notes (Signed)
Pt d/c with [facility]. Upon discharge, morphine infusion was pulled and wasted with Fredia Sorrow RN as witness. 34m wasted per protocol.

## 2022-02-22 NOTE — Progress Notes (Addendum)
Manufacturing engineer [facility])  Referral received for residential hospice at [facility]. Mr. Jewel is eligible for [facility] and we have a bed for him today.  Pts spouse has accepted the bed.  Once necessary consents have been completed, transport can be arranged.  Update TOC manager, and they will arrange transport once all is completed.  RN staff, you may call report to [phone] at any time, room is assigned when report is called. Also, please call Cletus Gash directly when transport arrives so he is aware of his ETA to [employer].  Thank you, Venia Carbon DNP, RN [facility] Liaison   **addendum 330 pm, all necessary consents have been completed and transport can be arranged for after 5 pm today. Updated TOC manager.

## 2022-03-01 ENCOUNTER — Telehealth: Payer: Self-pay | Admitting: Adult Health

## 2022-03-01 NOTE — Telephone Encounter (Signed)
So sorry to hear that. Will send a condolence card.

## 2022-03-01 NOTE — Telephone Encounter (Signed)
Spouse reported pt passed on Friday of last week

## 2022-03-02 DEATH — deceased

## 2022-04-15 ENCOUNTER — Ambulatory Visit: Payer: Medicare PPO | Admitting: Physical Therapy

## 2022-08-30 ENCOUNTER — Ambulatory Visit: Payer: Medicare PPO | Admitting: Adult Health

## 2024-07-05 ENCOUNTER — Encounter (HOSPITAL_BASED_OUTPATIENT_CLINIC_OR_DEPARTMENT_OTHER): Payer: Self-pay | Admitting: Surgery
